# Patient Record
Sex: Male | Born: 1955 | Race: White | Hispanic: No | Marital: Married | Smoking: Former smoker
Health system: Southern US, Community
[De-identification: ages and names within clinical notes are randomized; demographics above are authoritative.]

## PROBLEM LIST (undated history)

## (undated) DIAGNOSIS — D689 Coagulation defect, unspecified: Secondary | ICD-10-CM

## (undated) DIAGNOSIS — I2510 Atherosclerotic heart disease of native coronary artery without angina pectoris: Secondary | ICD-10-CM

## (undated) DIAGNOSIS — I213 ST elevation (STEMI) myocardial infarction of unspecified site: Secondary | ICD-10-CM

## (undated) DIAGNOSIS — R0600 Dyspnea, unspecified: Secondary | ICD-10-CM

## (undated) DIAGNOSIS — E785 Hyperlipidemia, unspecified: Secondary | ICD-10-CM

## (undated) DIAGNOSIS — I10 Essential (primary) hypertension: Secondary | ICD-10-CM

## (undated) DIAGNOSIS — I255 Ischemic cardiomyopathy: Secondary | ICD-10-CM

## (undated) DIAGNOSIS — M1990 Unspecified osteoarthritis, unspecified site: Secondary | ICD-10-CM

## (undated) DIAGNOSIS — K8020 Calculus of gallbladder without cholecystitis without obstruction: Secondary | ICD-10-CM

## (undated) HISTORY — DX: Hyperlipidemia, unspecified: E78.5

## (undated) HISTORY — PX: CORONARY STENT PLACEMENT: SHX1402

## (undated) HISTORY — PX: COLONOSCOPY: SHX174

## (undated) HISTORY — DX: Coagulation defect, unspecified: D68.9

## (undated) HISTORY — DX: Calculus of gallbladder without cholecystitis without obstruction: K80.20

## (undated) HISTORY — DX: ST elevation (STEMI) myocardial infarction of unspecified site: I21.3

## (undated) HISTORY — DX: Essential (primary) hypertension: I10

## (undated) HISTORY — DX: Ischemic cardiomyopathy: I25.5

## (undated) HISTORY — DX: Unspecified osteoarthritis, unspecified site: M19.90

## (undated) HISTORY — PX: MENISCUS REPAIR: SHX5179

---

## 2006-11-23 DIAGNOSIS — D126 Benign neoplasm of colon, unspecified: Secondary | ICD-10-CM

## 2006-11-23 HISTORY — DX: Benign neoplasm of colon, unspecified: D12.6

## 2007-08-17 ENCOUNTER — Ambulatory Visit: Payer: Self-pay | Admitting: Gastroenterology

## 2007-08-31 ENCOUNTER — Ambulatory Visit: Payer: Self-pay | Admitting: Gastroenterology

## 2007-08-31 ENCOUNTER — Encounter: Payer: Self-pay | Admitting: Gastroenterology

## 2008-09-18 ENCOUNTER — Ambulatory Visit (HOSPITAL_BASED_OUTPATIENT_CLINIC_OR_DEPARTMENT_OTHER): Admission: RE | Admit: 2008-09-18 | Discharge: 2008-09-18 | Payer: Self-pay | Admitting: Orthopedic Surgery

## 2011-04-07 NOTE — Op Note (Signed)
[name], Joseph Riddle                   ACCOUNT NO.:  [id number]   MEDICAL RECORD NO.:  [id number]          PATIENT TYPE:  AMB   LOCATION:  [facility]                         FACILITY:  [facility]   PHYSICIAN:  Georges Lynch. Gioffre, M.D.DATE OF BIRTH:  1956-08-01   DATE OF PROCEDURE:  09/18/2008  DATE OF DISCHARGE:                               OPERATIVE REPORT   SURGEON:  Georges Lynch. Darrelyn Hillock, M.D.   ASSISTANT:  Nurse.   PREOPERATIVE DIAGNOSES:  1. Complex tear, lateral meniscus, right knee.  2. Large chondral defect in the lateral femoral condyle.   POSTOPERATIVE DIAGNOSES:  1. Complex tear, lateral meniscus, right knee.  2. Large chondral defect in the lateral femoral condyle.   OPERATION:  1. Diagnostic arthroscopy of the left knee.  2. Lateral meniscectomy of a complex bucket-handle tear of the lateral      meniscus, right knee.   PROCEDURE:  Under general anesthesia, the patient first had 2 grams of  IV Ancef.  At this time a small punctate incision was made in the  suprapatellar region.  An inflow cannula was inserted.  The knee was  distended with saline.  Another small punctate incision was made in the  lateral joint.  The arthroscope was entered on the lateral side.  Note,  I did a complete diagnostic arthroscopy.  He had some mild synovitis in  the suprapatellar pouch.  I went over medially the condyle and the  tibial plateau was normal.  I probed the medial meniscus.  It was  intact.  He had mild synovitis.  The cruciates were intact and over the  lateral joint he had a large complex tear of the medial meniscus, bucket-  handle type, extending from the midportion up anteriorly.  I introduced  the Arthrocare as well as a shaver suction device, did a lateral  meniscectomy of the midportion all the way up anteriorly.  Note, there  was a very interesting highlight, finding he had a large chondral defect  in the lateral femoral condyle with some hemorrhage around it.  There  were no  obvious large loose bodies present.  I thoroughly searched the  knee out.  I thoroughly irrigated out the area and probed the menisci  and the lateral meniscus as well completely to make sure there was no  other loose piece of meniscus. The posterior horn from the midportion  back was intact.  I thoroughly irrigated out the knee, removed the  fluid, and closed all 3 punctate incisions with 3-0 nylon suture.  I  injected 30 mL of 0.5% Marcaine with epinephrine into the knee joint.  Sterile Neosporin, Bunnell dressing was applied.   FOLLOW-UP CARE:  1. He will be on aspirin 325 mg b.i.d. as an anticoagulant.  2. Percocet 10/650 1 every 4 hours p.r.n. for pain.  3. He will be on crutches, partial to full weightbearing as tolerated.  4. I will see him in the office in 12-14 days or prior to if there is      a problem.  ______________________________  Georges Lynch Darrelyn Hillock, M.D.     RAG/MEDQ  D:  09/18/2008  T:  09/18/2008  Job:  [id number]

## 2011-08-25 LAB — CBC
HCT: 47.8
Hemoglobin: 16.2
MCHC: 33.9
MCV: 93.2
Platelets: 390
RBC: 5.13
RDW: 12.6
WBC: 7.6

## 2011-08-25 LAB — COMPREHENSIVE METABOLIC PANEL
Albumin: 4.5
Alkaline Phosphatase: 44
BUN: 13
Calcium: 9.6
Glucose, Bld: 105 — ABNORMAL HIGH
Potassium: 4.1
Sodium: 142
Total Protein: 7.2

## 2011-10-28 ENCOUNTER — Ambulatory Visit (INDEPENDENT_AMBULATORY_CARE_PROVIDER_SITE_OTHER): Payer: BC Managed Care – PPO | Admitting: Family Medicine

## 2011-10-28 DIAGNOSIS — M79609 Pain in unspecified limb: Secondary | ICD-10-CM

## 2011-10-28 DIAGNOSIS — G4700 Insomnia, unspecified: Secondary | ICD-10-CM

## 2012-01-29 ENCOUNTER — Other Ambulatory Visit: Payer: Self-pay | Admitting: *Deleted

## 2012-02-04 ENCOUNTER — Telehealth: Payer: Self-pay

## 2012-02-04 NOTE — Telephone Encounter (Signed)
Pt says that he has made appt for pe in march would like to know if he can get refills since he has that scheduled

## 2012-02-05 NOTE — Telephone Encounter (Signed)
Advised pt that we will call in 3 more refills on Ambien per Alycia Rossetti but need to schedule physical before running out.  Pt agreed.

## 2012-02-10 ENCOUNTER — Ambulatory Visit: Payer: BC Managed Care – PPO | Admitting: Family Medicine

## 2012-03-10 ENCOUNTER — Ambulatory Visit (INDEPENDENT_AMBULATORY_CARE_PROVIDER_SITE_OTHER): Payer: BC Managed Care – PPO | Admitting: Family Medicine

## 2012-03-10 ENCOUNTER — Encounter: Payer: Self-pay | Admitting: Family Medicine

## 2012-03-10 VITALS — BP 138/88 | HR 65 | Temp 97.0°F | Resp 16 | Ht 68.0 in | Wt 164.2 lb

## 2012-03-10 DIAGNOSIS — Z0000 Encounter for general adult medical examination without abnormal findings: Secondary | ICD-10-CM

## 2012-03-10 DIAGNOSIS — M159 Polyosteoarthritis, unspecified: Secondary | ICD-10-CM

## 2012-03-10 DIAGNOSIS — G4700 Insomnia, unspecified: Secondary | ICD-10-CM

## 2012-03-10 LAB — POCT URINALYSIS DIPSTICK
Blood, UA: NEGATIVE
Glucose, UA: NEGATIVE
Spec Grav, UA: 1.025
Urobilinogen, UA: 0.2

## 2012-03-10 MED ORDER — NABUMETONE 500 MG PO TABS: 500.0000 mg | ORAL_TABLET | Freq: Two times a day (BID) | ORAL | Status: DC

## 2012-03-10 MED ORDER — ZOLPIDEM TARTRATE 10 MG PO TABS: 10.0000 mg | ORAL_TABLET | Freq: Every evening | ORAL | Status: DC | PRN

## 2012-03-10 NOTE — Patient Instructions (Addendum)
Tumeric is a natural supplement that helps relieve joint pain; this is the substance that gives mustard its yellow color. You are also getting some information about Anti-Inflammatory Diet.   Keeping you healthy  Get these tests  Blood pressure- Have your blood pressure checked once a year by your healthcare provider.  Normal blood pressure is 120/80  Weight- Have your body mass index (BMI) calculated to screen for obesity.  BMI is a measure of body fat based on height and weight. You can also calculate your own BMI at [email or web address].  Cholesterol- Have your cholesterol checked every year.  Diabetes- Have your blood sugar checked regularly if you have high blood pressure, high cholesterol, have a family history of diabetes or if you are overweight.  Screening for Colon Cancer- Colonoscopy starting at age 81.  Screening may begin sooner depending on your family history and other health conditions. Follow up colonoscopy as directed by your Gastroenterologist.  Screening for Prostate Cancer- Both blood work (PSA) and a rectal exam help screen for Prostate Cancer.  Screening begins at age 12 with African-American men and at age 54 with Caucasian men.  Screening may begin sooner depending on your family history.  Take these medicines  Aspirin- One aspirin daily can help prevent Heart disease and Stroke.  Flu shot- Every fall.  Tetanus- Every 10 years.  Zostavax- Once after the age of 18 to prevent Shingles.  Pneumonia shot- Once after the age of 20; if you are younger than 47, ask your healthcare provider if you need a Pneumonia shot.  Take these steps  Don't smoke- If you do smoke, talk to your doctor about quitting.  For tips on how to quit, go to www.smokefree.gov or call 1-800-QUIT-NOW.  Be physically active- Exercise 5 days a week for at least 30 minutes.  If you are not already physically active start slow and gradually work up to 30 minutes of moderate physical  activity.  Examples of moderate activity include walking briskly, mowing the yard, dancing, swimming, bicycling, etc.  Eat a healthy diet- Eat a variety of healthy food such as fruits, vegetables, low fat milk, low fat cheese, yogurt, lean meant, poultry, fish, beans, tofu, etc. For more information go to www.thenutritionsource.org  Drink alcohol in moderation- Limit alcohol intake to less than two drinks a day. Never drink and drive.  Dentist- Brush and floss twice daily; visit your dentist twice a year.  Depression- Your emotional health is as important as your physical health. If you're feeling down, or losing interest in things you would normally enjoy please talk to your healthcare provider.  Eye exam- Visit your eye doctor every year.  Safe sex- If you may be exposed to a sexually transmitted infection, use a condom.  Seat belts- Seat belts can save your life; always wear one.  Smoke/Carbon Monoxide detectors- These detectors need to be installed on the appropriate level of your home.  Replace batteries at least once a year.  Skin cancer- When out in the sun, cover up and use sunscreen 15 SPF or higher.  Violence- If anyone is threatening you, please tell your healthcare provider. Living Will/ Health care power of attorney- Speak with your healthcare provider and family.

## 2012-03-11 LAB — LIPID PANEL
Cholesterol: 153 mg/dL (ref 0–200)
HDL: 48 mg/dL (ref >39)
LDL Cholesterol: 92 mg/dL (ref 0–99)
Total CHOL/HDL Ratio: 3.2 Ratio
Triglycerides: 66 mg/dL (ref <150)
VLDL: 13 mg/dL (ref 0–40)

## 2012-03-11 LAB — PSA: PSA: 0.38 ng/mL (ref <=4.00)

## 2012-03-11 NOTE — Progress Notes (Signed)
Quick Note:  Please notify pt that results are normal.   Provide pt with copy of labs. ______ 

## 2012-03-13 ENCOUNTER — Encounter: Payer: Self-pay | Admitting: Family Medicine

## 2012-03-13 NOTE — Progress Notes (Addendum)
  Subjective:    Patient ID: Joseph Riddle, male    DOB: [birth year], 56 y.o.   MRN: [id number]  HPI   This 56 y.o Cauc male is here for  CPE; chronic medical problems include DJD and Insomina  secondary to chronic nocturnal pain. He has had Orthopedic surgeries on his knee and shoulder with  persistent pain  involving back, shoulders (bone spur on left side), wrists, knees and feet. Otherwise, he  has no major complaints.          He is married and he and his wife have adopted to children who are half-siblings; the younger one   is 42 years old. He works as a IT trainer. His exercise tolerance is limited due to chronic pain.    Review of Systems  As per HPI; otherwise, noncontributory     Objective:   Physical Exam  Nursing note and vitals reviewed. Constitutional: He is oriented to person, place, and time. He appears well-developed and well-nourished. No distress.  HENT:  Head: Normocephalic and atraumatic.  Right Ear: External ear normal.  Left Ear: External ear normal.  Nose: Nose normal.  Mouth/Throat: Oropharynx is clear and moist.  Eyes: Conjunctivae and EOM are normal. Pupils are equal, round, and reactive to light. No scleral icterus.  Neck: No thyromegaly present.       ROM is decreased in all directions.  Cardiovascular: Normal rate, regular rhythm, normal heart sounds and intact distal pulses.  Exam reveals no gallop.   No murmur heard. Pulmonary/Chest: Effort normal and breath sounds normal. No respiratory distress. He exhibits no tenderness.  Abdominal: Bowel sounds are normal. He exhibits no distension and no mass. There is no guarding. Hernia confirmed negative in the right inguinal area and confirmed negative in the left inguinal area.  Genitourinary: Rectum normal, prostate normal, testes normal and penis normal. Rectal exam shows no external hemorrhoid, no mass and anal tone normal. Guaiac negative stool. Right testis shows no mass, no swelling and no tenderness. Left testis  shows no mass, no swelling and no tenderness.  Musculoskeletal:       Right shoulder with significant audible crepitus and decreased ROM with external rotation. Both knees and ankles with mild deformities and decreased ROM; well-healed scar noted on right knee  Lymphadenopathy:    He has no cervical adenopathy.       Right: No inguinal adenopathy present.       Left: No inguinal adenopathy present.  Neurological: He is alert and oriented to person, place, and time. He has normal reflexes. No cranial nerve deficit. Coordination normal.  Skin: Skin is warm and dry. No rash noted. No pallor.  Psychiatric: His speech is normal and behavior is normal. Judgment and thought content normal. Cognition and memory are normal.       Mood is normal but affect is flat          Assessment & Plan:   1. Routine general medical examination at a health care facility  Lipid panel, PSA    2. Degenerative joint disease involving multiple joints  -Encouraged increased activity level to improve joint mobility -Tumeric (dietary supplement) has anti-inflammatory properties -Given Anti-Inflammatory diet -Continue  Fish oil supplement -Discontinue Indocin -Trial Nabumetone 500 mg  1 tab bid with meals;   contact office if no improve in 2 months  3. Insomnia  zolpidem (AMBIEN) 10 MG tablet    Pt was given  RX: Zostavax (immunization at outside pharmacy at his convenience)

## 2012-03-30 ENCOUNTER — Encounter: Payer: Self-pay | Admitting: Family Medicine

## 2012-04-29 ENCOUNTER — Ambulatory Visit (INDEPENDENT_AMBULATORY_CARE_PROVIDER_SITE_OTHER): Payer: BC Managed Care – PPO | Admitting: Family Medicine

## 2012-04-29 ENCOUNTER — Ambulatory Visit: Payer: BC Managed Care – PPO

## 2012-04-29 ENCOUNTER — Encounter: Payer: Self-pay | Admitting: Family Medicine

## 2012-04-29 VITALS — BP 137/87 | HR 67 | Temp 97.0°F | Resp 16 | Ht 68.5 in | Wt 167.0 lb

## 2012-04-29 DIAGNOSIS — G4701 Insomnia due to medical condition: Secondary | ICD-10-CM

## 2012-04-29 DIAGNOSIS — M542 Cervicalgia: Secondary | ICD-10-CM

## 2012-04-29 DIAGNOSIS — M25512 Pain in left shoulder: Secondary | ICD-10-CM

## 2012-04-29 DIAGNOSIS — G8929 Other chronic pain: Secondary | ICD-10-CM

## 2012-04-29 DIAGNOSIS — R079 Chest pain, unspecified: Secondary | ICD-10-CM

## 2012-04-29 DIAGNOSIS — M25519 Pain in unspecified shoulder: Secondary | ICD-10-CM

## 2012-04-29 MED ORDER — INDOMETHACIN 25 MG PO CAPS: 25.0000 mg | ORAL_CAPSULE | Freq: Two times a day (BID) | ORAL | Status: DC

## 2012-04-29 MED ORDER — GABAPENTIN 100 MG PO CAPS: ORAL_CAPSULE | ORAL | Status: DC

## 2012-04-29 NOTE — Patient Instructions (Signed)

## 2012-04-29 NOTE — Progress Notes (Addendum)
  Subjective:    Patient ID: Joseph Riddle, male    DOB: Mar 22, 1956, 56 y.o.   MRN: [id number]  HPI  Thsi 56 y.o. Cauc male is here for evaluation of shoulder/ joint pains, left-sided neck and chest  discomfort, fatigue and poor sleep hygiene. Indomethacin helps more than any other NSAID. Foot  and leg discomfort interrupts sleep.; Zolpidem is essential for any rest. Feels stressed. The left-sided  chest pain radiates up into neck and also down left arm, accompanied by weakness and numbness.  The pain is not associated with activity and can occur at rest, lasting 30 minutes. Also having heartburn  because of poor dietary habits; takes Zantac few times/ week.      Review of Systems  Constitutional: Positive for fatigue. Negative for fever and diaphoresis.  HENT: Positive for neck pain and neck stiffness.   Respiratory: Positive for shortness of breath. Negative for cough and chest tightness.   Cardiovascular: Positive for palpitations.  Gastrointestinal: Negative for nausea and abdominal pain.  Musculoskeletal: Positive for arthralgias. Negative for myalgias and joint swelling.  Neurological: Positive for numbness. Negative for syncope, speech difficulty and headaches.  Psychiatric/Behavioral: Positive for disturbed wake/sleep cycle. The patient is nervous/anxious.        Objective:   Physical Exam  Nursing note and vitals reviewed. Constitutional: He is oriented to person, place, and time. He appears well-developed and well-nourished. No distress.  HENT:  Head: Normocephalic and atraumatic.  Right Ear: External ear normal.  Left Ear: External ear normal.  Mouth/Throat: Oropharynx is clear and moist.  Eyes: Conjunctivae and EOM are normal. No scleral icterus.  Neck: No thyromegaly present.       ROM decreased with rotation, extension, flexion is normal  Cardiovascular: Normal rate, regular rhythm and normal heart sounds.  Exam reveals no gallop and no friction rub.   No murmur  heard. Pulmonary/Chest: Effort normal. No respiratory distress. He has no wheezes.  Musculoskeletal:       C-spine and T-spine are tender over spinous processes; Paravertebral muscle spasms  Neurological: He is alert and oriented to person, place, and time. No cranial nerve deficit. Coordination normal.  Skin: Skin is warm and dry.  Psychiatric:       Flat affect with pressured speech and distracted demeanor; fidgets and is anxious through visit     [facility] reading (PRIMARY) by  Dr. Audria Nine:   Multi-level degenerative changes at C5-6, C6-7 and C7-T1;                                                                              Worse at C6-7 with spurring and loss of disc space  ECG: NSR without abnormalities.       Assessment & Plan:   1. Shoulder pain, bilateral - if pain continues, consider MRI Continue Indomethacin Trial Gabapentin  100 mg initially at bedtime; increase to 1 bid until follow-up  2. Chest pain at rest  EKG 12-Lead- normal  3. Neck pain on left side  DG Cervical Spine 2-3 Views  4. Insomnia secondary to chronic pain

## 2012-05-17 ENCOUNTER — Other Ambulatory Visit: Payer: Self-pay | Admitting: Family Medicine

## 2012-07-13 ENCOUNTER — Ambulatory Visit (INDEPENDENT_AMBULATORY_CARE_PROVIDER_SITE_OTHER): Payer: BC Managed Care – PPO | Admitting: Family Medicine

## 2012-07-13 ENCOUNTER — Encounter: Payer: Self-pay | Admitting: Family Medicine

## 2012-07-13 VITALS — BP 110/82 | HR 62 | Temp 98.0°F | Resp 16 | Ht 69.0 in | Wt 165.4 lb

## 2012-07-13 DIAGNOSIS — M2550 Pain in unspecified joint: Secondary | ICD-10-CM

## 2012-07-13 DIAGNOSIS — M5030 Other cervical disc degeneration, unspecified cervical region: Secondary | ICD-10-CM | POA: Insufficient documentation

## 2012-07-13 MED ORDER — GABAPENTIN 300 MG PO CAPS: ORAL_CAPSULE | ORAL | Status: DC

## 2012-07-13 NOTE — Patient Instructions (Addendum)
Try Arnica gel for joint pain. Call Dr. Jeannetta Ellis office to set up an appointment. Gabapentin dose has been increased to:  300 mg per capsule   Take 1 capsule at midday and 2 capsules at bedtime.    (Take the 100 mg capsules as follows- 2 at midday and 3 at bedtime until they are gone).

## 2012-07-13 NOTE — Progress Notes (Signed)
S; This 56 y.o. Cauc male returns for follow-up re: chronic joint pain;  His shoulder pain is no better and he notes stiffness and "creaking" in joints. His hands hurt and his feet ache at end of the day. He was evaluated by a podiatrist (? Dr. Ralene Cork at [facility] was advised to consider left foot surgery for bone spurs. He is wearing SASS shoes which help; Gabapentin prescribed by me at last visit is ineffective (dose is 100 mg twice a day). He has trouble sleeping due to leg pain.   Pain= 8/10 by end of the day.   ROS: No fever or activity change. + arthralgias and gait problems. Insomnia- Ambien helps.  O:  Filed Vitals:   07/13/12 0937  BP: 110/82  Pulse: 62  Temp: 98 F (36.7 C)  Resp: 16   GEN: In NAD; WN,WD COR: RRR LUNGS: Normal resp rate and effort MS: C-spine with forward positioning of head; tender paravertebral muscles and decreased ROM with rotation and extension         T-spine- mild kyphosis noted in upper portion         Shoulders- decreased ROM with limited internal rotation. Crepitus with rotation         Knees- tender right joint without effusion, redness or warmth; no significant deformity NEURO: A&O x 3; CNs intact without focality. Motor function normal in upper ext.   A/P: 1. Multiple joint pain - needs to have ORTHO Evaluation re: C-spine DDD and shoulder pain Ambulatory referral to Orthopedic Surgery RX: Gabapentin 300 mg 1 capsule at midday and 2 capsules at bedtime (pt has 100 mg capsules and can take 2 caps at midday and 3 caps hs until finished)  2. DDD (degenerative disc disease), cervical  May need MRI of C-spine

## 2012-08-26 ENCOUNTER — Encounter: Payer: Self-pay | Admitting: Gastroenterology

## 2012-09-17 ENCOUNTER — Other Ambulatory Visit: Payer: Self-pay

## 2012-09-17 DIAGNOSIS — G4700 Insomnia, unspecified: Secondary | ICD-10-CM

## 2012-09-17 MED ORDER — ZOLPIDEM TARTRATE 10 MG PO TABS: 10.0000 mg | ORAL_TABLET | Freq: Every evening | ORAL | Status: DC | PRN

## 2012-11-08 ENCOUNTER — Other Ambulatory Visit: Payer: Self-pay | Admitting: Family Medicine

## 2012-12-01 ENCOUNTER — Other Ambulatory Visit: Payer: Self-pay | Admitting: Family Medicine

## 2012-12-13 ENCOUNTER — Ambulatory Visit (INDEPENDENT_AMBULATORY_CARE_PROVIDER_SITE_OTHER): Payer: BC Managed Care – PPO | Admitting: Family Medicine

## 2012-12-13 ENCOUNTER — Encounter: Payer: Self-pay | Admitting: Family Medicine

## 2012-12-13 VITALS — BP 120/80 | HR 74 | Temp 97.4°F | Resp 16 | Ht 69.0 in | Wt 168.0 lb

## 2012-12-13 DIAGNOSIS — M25569 Pain in unspecified knee: Secondary | ICD-10-CM

## 2012-12-13 DIAGNOSIS — Z760 Encounter for issue of repeat prescription: Secondary | ICD-10-CM

## 2012-12-13 DIAGNOSIS — G4700 Insomnia, unspecified: Secondary | ICD-10-CM

## 2012-12-13 DIAGNOSIS — J0100 Acute maxillary sinusitis, unspecified: Secondary | ICD-10-CM

## 2012-12-13 DIAGNOSIS — M25561 Pain in right knee: Secondary | ICD-10-CM

## 2012-12-13 MED ORDER — GABAPENTIN 300 MG PO CAPS: ORAL_CAPSULE | ORAL | Status: DC

## 2012-12-13 MED ORDER — ZOLPIDEM TARTRATE 10 MG PO TABS: 10.0000 mg | ORAL_TABLET | Freq: Every evening | ORAL | Status: DC | PRN

## 2012-12-13 MED ORDER — CEFUROXIME AXETIL 250 MG PO TABS: 250.0000 mg | ORAL_TABLET | Freq: Two times a day (BID) | ORAL | Status: DC

## 2012-12-13 NOTE — Patient Instructions (Addendum)

## 2012-12-13 NOTE — Progress Notes (Signed)
S: This 56 y.o. Cauc male has chronic insomnia, partially related to chronic moderately severe right knee pain. He had a bucket-handle meniscal tear which was surgically repaired by Dr. Darrelyn Hillock in 2008. He has had persistent pain with crepitus, worse at end of day. 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 Gabapentin and Indomethacin seem to help provide some relief; Gabapentin does help with sleep. Pt would like to increase dose of this med. He takes a midday Gabapentin dose and has no sedation. He still needs to take Zolpidem for sleep.  His main concern today is sinus pain and possible infection. Symptoms onset 3-4 days ago; he denies fever /chills. He has a mild headache and left ear discomfort (which is chronic). Household members are ill, especially 3 y.o.grandson. He states this is the busiest time of year for him and he cannot afford to be sick. Sudafed has not been very beneficial.  ROS: As per HPI.  O:  Filed Vitals:   12/13/12 1625  BP: 120/80  Pulse: 74  Temp: 97.4 F (36.3 C)  Resp: 16   GEN: In NAD; WN,WD. HEENT: [place]/AT; EOMI w/ injected conj and clear sclerae. EACs normal. TMs dull w/ chronic scarring. Maxillary sinuses tender (R>L). Post ph erythematous w/o exudate. NECK: No LAN or TMG. COR: RRR. LUNGS: Normal resp rate and effort. SKIN: Mild facial erythema. No rashes. MS: Right knee- crepitus palpable at medial tibial plateau. Joint visibly larger compared to Left knee. NEURO: A&O x 3; CNs intact. Nonfocal.  A/P:  1. Sinusitis, acute maxillary  RX: Cefuroxime 250 mg 1 tab bid x 10 days- pt wants to drop off RX at pharmacy.  2. Pain in joint of right knee  Increase Gabapentin bedtime dose to 3 capsules Pt to schedule appt w/ ORTHO  3. Insomnia  zolpidem (AMBIEN) 10 MG tablet  4. Issue of repeat prescriptions

## 2013-03-10 ENCOUNTER — Telehealth: Payer: Self-pay

## 2013-03-10 DIAGNOSIS — G4700 Insomnia, unspecified: Secondary | ICD-10-CM

## 2013-03-10 NOTE — Telephone Encounter (Signed)
Pharm requests RF of zolpidem 10 mg. 

## 2013-03-13 ENCOUNTER — Encounter: Payer: Self-pay | Admitting: Family Medicine

## 2013-03-13 MED ORDER — ZOLPIDEM TARTRATE 10 MG PO TABS: 10.0000 mg | ORAL_TABLET | Freq: Every evening | ORAL | Status: DC | PRN

## 2013-03-13 NOTE — Telephone Encounter (Signed)
Zolpidem refill called to pharmacy- #[phone] w/ 2 RFs.

## 2013-03-16 ENCOUNTER — Other Ambulatory Visit: Payer: Self-pay | Admitting: Physician Assistant

## 2013-04-11 ENCOUNTER — Other Ambulatory Visit: Payer: Self-pay | Admitting: Family Medicine

## 2013-05-12 ENCOUNTER — Encounter: Payer: Self-pay | Admitting: Gastroenterology

## 2013-06-14 ENCOUNTER — Other Ambulatory Visit: Payer: Self-pay | Admitting: Physician Assistant

## 2013-06-14 ENCOUNTER — Other Ambulatory Visit: Payer: Self-pay | Admitting: Family Medicine

## 2013-06-14 NOTE — Telephone Encounter (Signed)
Pt said pharmacy was sending over requests for his refills.  He doesn't know if we can fill them or if he needs to come in to have them filled.  He doesn't mind coming in if he needs to, he just needs to know asap because he is about to go out of town.  Please call [phone]

## 2013-06-14 NOTE — Telephone Encounter (Signed)
Pharmacy reqs RF of zolpidem 10 mg

## 2013-06-15 ENCOUNTER — Telehealth: Payer: Self-pay

## 2013-06-15 NOTE — Telephone Encounter (Signed)
Please advise on Ambien refill request.

## 2013-06-15 NOTE — Telephone Encounter (Signed)
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  He is a regular patient and is going out of town and needs his prescription before he leaves.  His best number is [phone] and his pharmacy is [facility] on [address].

## 2013-06-16 ENCOUNTER — Telehealth: Payer: Self-pay | Admitting: Radiology

## 2013-06-16 DIAGNOSIS — G4700 Insomnia, unspecified: Secondary | ICD-10-CM

## 2013-06-16 MED ORDER — ZOLPIDEM TARTRATE 10 MG PO TABS: 10.0000 mg | ORAL_TABLET | Freq: Every evening | ORAL | Status: DC | PRN

## 2013-06-16 NOTE — Telephone Encounter (Signed)
Patient called again about the Ambien. Please advise so I can call patient. His cell #[phone] or [phone], he indicates he has been trying to get filled since Tuesday, he called again yesterday. Please advise if one of you can renew this for him,or if I have to wait for the response from Dr Audria Nine.

## 2013-06-16 NOTE — Telephone Encounter (Signed)
Rx printed and ready to be sent to pharmacy 

## 2013-06-16 NOTE — Telephone Encounter (Signed)
Pt advised Rx sent to [facility]

## 2013-06-21 NOTE — Telephone Encounter (Signed)
This medication refill was commpleted by Rhoderick Moody, PA on 06/16/13.

## 2013-07-14 ENCOUNTER — Other Ambulatory Visit: Payer: Self-pay | Admitting: Physician Assistant

## 2013-07-17 ENCOUNTER — Telehealth: Payer: Self-pay

## 2013-07-17 DIAGNOSIS — G4700 Insomnia, unspecified: Secondary | ICD-10-CM

## 2013-07-17 NOTE — Telephone Encounter (Signed)
Pt is calling and stating that he is suppose to have had his ambien filled and the pharmacy is suppose to have faxed over a request Call back number is [phone] Pharmacy is [facility] on [address]

## 2013-07-17 NOTE — Telephone Encounter (Signed)
Pharmacy needs to send requests electronically.  I do not see where we have received a request from the pharmacy, but I will forward pt's request to Dr. Audria Nine

## 2013-07-18 MED ORDER — ZOLPIDEM TARTRATE 10 MG PO TABS: 10.0000 mg | ORAL_TABLET | Freq: Every evening | ORAL | Status: DC | PRN

## 2013-07-18 NOTE — Telephone Encounter (Signed)
Patient advised of this. 

## 2013-07-18 NOTE — Telephone Encounter (Signed)
I phoned medication refill request to pt's pharmacy.

## 2013-07-18 NOTE — Telephone Encounter (Signed)
[facility] is still in [place] and faxes the request for controlls, please advise, Amy

## 2013-07-19 ENCOUNTER — Telehealth: Payer: Self-pay

## 2013-07-19 ENCOUNTER — Ambulatory Visit (INDEPENDENT_AMBULATORY_CARE_PROVIDER_SITE_OTHER): Payer: BC Managed Care – PPO | Admitting: Family Medicine

## 2013-07-19 ENCOUNTER — Encounter: Payer: Self-pay | Admitting: Family Medicine

## 2013-07-19 VITALS — BP 120/82 | HR 67 | Temp 97.9°F | Resp 16 | Ht 67.0 in | Wt 159.8 lb

## 2013-07-19 DIAGNOSIS — M159 Polyosteoarthritis, unspecified: Secondary | ICD-10-CM

## 2013-07-19 DIAGNOSIS — G4700 Insomnia, unspecified: Secondary | ICD-10-CM

## 2013-07-19 DIAGNOSIS — J329 Chronic sinusitis, unspecified: Secondary | ICD-10-CM

## 2013-07-19 MED ORDER — ZOLPIDEM TARTRATE 10 MG PO TABS: 10.0000 mg | ORAL_TABLET | Freq: Every evening | ORAL | Status: DC | PRN

## 2013-07-19 MED ORDER — GABAPENTIN 300 MG PO CAPS: ORAL_CAPSULE | ORAL | Status: DC

## 2013-07-19 MED ORDER — DICLOFENAC SODIUM 75 MG PO TBEC: 75.0000 mg | DELAYED_RELEASE_TABLET | Freq: Two times a day (BID) | ORAL | Status: DC

## 2013-07-19 MED ORDER — CEFUROXIME AXETIL 250 MG PO TABS: 250.0000 mg | ORAL_TABLET | Freq: Two times a day (BID) | ORAL | Status: DC

## 2013-07-19 NOTE — Telephone Encounter (Signed)
Pt notified by phone ready for pickup.   Pt came by around 330 and picked up forms.  bf

## 2013-07-19 NOTE — Telephone Encounter (Signed)
I will authorize a Temporary Placard; completed form is at [address] [facility] and he can pick it up tomorrow.

## 2013-07-19 NOTE — Telephone Encounter (Signed)
Pt called requesting a handicap placard due to his knee problems. Pt saw dr Audria Nine today and states he forgot to ask her about that during his appt.   States he is seeing an ortho dr soon, but doesn't know if it will end up in surgery or not. States not sure if the tag he needs is temporary or permanent.   Please call pt  bf

## 2013-07-19 NOTE — Progress Notes (Signed)
S: This 57 y.o Cauc male has chronic joint pain and foot pain; he plans to return to the podiatrist this fall to discuss foot surgery ([facility]). He also has neck pain ("I have spurs on the bones in my neck") and this seems to be worsening.  He has chronic L shoulder pain that is getting worse. He feels tightness and pain in L upper chest and biceps area when he raises arm above shoulder level. He has chronic knee pain (R>L) and has seen Dr. Darrelyn Hillock in the past about possible knee surgery. Pt will contact his office but already states he does not want to have surgery. He does not recall having MRI of shoulder or knee. Pt thinks Dr. Darrelyn Hillock tried Celebrex but pt saw no benefit after 2-3 weeks of this medication. He is willing to try it again as Indomethacin is not as effective as it used to be.  Pt c/o pain in L sinus w/ congestion and cannot breath through L nostril. His L eye hurts and his has light drainage from L nostril with PND. Mucinex and Sudafed help. Left side always flares up; he has been afebrile and w/o cough, epistaxis, n/v. Pt remarks, "I may have a tumor or some other serious problem; I don't know".  Insomnia- needs Zolpidem to rest well. Leg and foot pain prevents restful sleep. Laying on left side- this position is not comfortable as L side of head and neck seem to hurt worse.   Patient Active Problem List   Diagnosis Date Noted  . DDD (degenerative disc disease), cervical 07/13/2012  . Degenerative joint disease involving multiple joints 03/10/2012  . Insomnia 03/10/2012   PMHx, Soc Hx and Fam Hx reviewed. Medications reconciled.  ROS; As per HPI; otherwise noncontributory.  O: Filed Vitals:   07/19/13 0953  BP: 120/82  Pulse: 67  Temp: 97.9 F (36.6 C)  Resp: 16   GEN: In NAD; WN,WD. Is not relaxed but seems to have mild pain. HEENT: [place]/AT; EOMI w/ clear conj/sclerae. EACs/TMs intact w/ chronic scarring- no effusion, erythema or bulging. Nasal mucosa  erythematous. Sinuses- NT w/ percussion. Post ph- erythema w/ mild cobblestoning, no exudate. NECK: Tender over post muscles and spinous processes. COR: RRR. LUNGS: Normal resp rate and effort. MS: L shoulder- decreased ROM w/ pain reproducible above 90 degrees; biceps/deltoid nontender.  R knee- Valgus deviation of lower leg; no joint effusion. BACK: Nontender levator and rhomboid muscles. NEURO: A&O x 3; CNs intact. Sensory/ motor function intact. PSYCH: Anxious demeanor w/o agitation, lability or inappropriateness. Speech pattern is normal but thought content a little tangential and scattered. Judgement is sound.  A/P: Degenerative joint disease involving multiple joints - Pt will sch follow-up w/ Dr. Darrelyn Hillock. He may need MRI of L shoulder/ upper arm to fully evaluate this chronic problem. Trial Diclofenac 75 mg 1 tablet by mouth twice a day w/ meals. Discontinue Indomethacin. (Pt was willing to re-try Celebrex but cost may have been prohibitive) Plan: Uric Acid, Sedimentation Rate, Hepatitis C antibody, Rheumatoid factor  Insomnia - Plan: zolpidem (AMBIEN) 10 MG tablet  Sinusitis, chronic - Plan: Cefuroxime 250 mg 1 tab twice a day for 10 days; CT Maxillofacial WO CM- sinuses need to be evaluated.  Meds ordered this encounter  Medications  . DISCONTD: cefUROXime (CEFTIN) 250 MG tablet    Sig: Take 1 tablet (250 mg total) by mouth 2 (two) times daily.    Dispense:  20 tablet    Refill:  0  .  cefUROXime (CEFTIN) 250 MG tablet    Sig: Take 1 tablet (250 mg total) by mouth 2 (two) times daily.    Dispense:  20 tablet    Refill:  0  . zolpidem (AMBIEN) 10 MG tablet    Sig: Take 1 tablet (10 mg total) by mouth at bedtime as needed.    Dispense:  30 tablet    Refill:  3    Do not fill before September 16, 2013.            . diclofenac (VOLTAREN) 75 MG EC tablet    Sig: Take 1 tablet (75 mg total) by mouth 2 (two) times daily. Take after meals.    Dispense:  60 tablet    Refill:  3   . gabapentin (NEURONTIN) 300 MG capsule    Sig: TAKE 1 CAPSULE AT MIDDAY AND 3 CAPSULES AT BEDTIME    Dispense:  120 capsule    Refill:  5

## 2013-07-19 NOTE — Patient Instructions (Addendum)
I have ordered a CT of your sinuses to look for signs of chronic sinusitis or other abnormality to explain recurrent left-sided headache and "sinus infection".  Please contact Dr. Jeannetta Ellis office to schedule a follow-up appointment for re-assessment of shoulder and knee pain. I am prescribing a different anti-inflammatory pain medication (one that is covered by your insurer).  I will contact you with the results of imaging and labs when I have all results.

## 2013-07-20 LAB — COMPREHENSIVE METABOLIC PANEL
AST: 20 U/L (ref 0–37)
Albumin: 4.4 g/dL (ref 3.5–5.2)
Alkaline Phosphatase: 52 U/L (ref 39–117)
Potassium: 4.2 mEq/L (ref 3.5–5.3)
Sodium: 142 mEq/L (ref 135–145)
Total Bilirubin: 0.6 mg/dL (ref 0.3–1.2)
Total Protein: 6.4 g/dL (ref 6.0–8.3)

## 2013-07-20 LAB — HEPATITIS C ANTIBODY: HCV Ab: NEGATIVE

## 2013-07-20 NOTE — Progress Notes (Signed)
Quick Note:  Please notify pt that results are normal.   Provide pt with copy of labs. ______ 

## 2013-07-25 ENCOUNTER — Other Ambulatory Visit: Payer: Self-pay

## 2013-07-25 ENCOUNTER — Ambulatory Visit
Admission: RE | Admit: 2013-07-25 | Discharge: 2013-07-25 | Disposition: A | Discharge status: Home / Self Care | Payer: BC Managed Care – PPO | Source: Ambulatory Visit | Attending: Family Medicine | Admitting: Family Medicine

## 2013-07-25 DIAGNOSIS — J329 Chronic sinusitis, unspecified: Secondary | ICD-10-CM

## 2013-07-25 NOTE — Progress Notes (Signed)
Quick Note:  Advise pt that recent sinus CT showed only minor changes of chronic sinusitis. No further evaluation needed at this time. ______

## 2013-07-31 ENCOUNTER — Telehealth: Payer: Self-pay | Admitting: Family Medicine

## 2013-07-31 NOTE — Telephone Encounter (Signed)
Advised pt that sinus CT showed very minor changes in sinuses c/w chronic sinusitis. Offered referral to ENT but pt declined; "medical budget of this year spent on CT". I reviewed suggestions to help reduce symptoms; he has NETI POT which I encouraged him to use.

## 2013-09-04 ENCOUNTER — Telehealth: Payer: Self-pay | Admitting: *Deleted

## 2013-09-04 NOTE — Telephone Encounter (Signed)
Pt called left only name, DOB and [phone].  Returned call 400pm - left message to call with concern or set up an appt.

## 2013-09-29 ENCOUNTER — Ambulatory Visit (INDEPENDENT_AMBULATORY_CARE_PROVIDER_SITE_OTHER): Payer: BC Managed Care – PPO

## 2013-09-29 VITALS — BP 124/87 | HR 82 | Resp 18

## 2013-09-29 DIAGNOSIS — M7730 Calcaneal spur, unspecified foot: Secondary | ICD-10-CM

## 2013-09-29 DIAGNOSIS — M2020 Hallux rigidus, unspecified foot: Secondary | ICD-10-CM

## 2013-09-29 DIAGNOSIS — M7660 Achilles tendinitis, unspecified leg: Secondary | ICD-10-CM

## 2013-09-29 DIAGNOSIS — M79609 Pain in unspecified limb: Secondary | ICD-10-CM

## 2013-09-29 DIAGNOSIS — G5790 Unspecified mononeuropathy of unspecified lower limb: Secondary | ICD-10-CM

## 2013-09-29 DIAGNOSIS — G5791 Unspecified mononeuropathy of right lower limb: Secondary | ICD-10-CM

## 2013-09-29 DIAGNOSIS — M1990 Unspecified osteoarthritis, unspecified site: Secondary | ICD-10-CM

## 2013-09-29 NOTE — Progress Notes (Signed)
Subjective:    Patient ID: Joseph Riddle, male    DOB: 02/14/1956, 57 y.o.   MRN: [id number]  HPIbunion left foot and been going on for 2 years and pain with or without shoes and redness and sore and tender and the right heel been bothering me for about 3 years and burning and hurts on side and bottom Patient is referred bunion left foot is actually described it is a hallux limitus and rigidus deformity both great joints great toe joints have reduced range of motion left significantly worse than right. There is pain on any attempted motion on the left foot great toe joint. Patient also in pain in the posterior lateral aspect of the right heel not in the inferior heel. It hurts when he is driving his car or lying in bed he describes a paresthesia burning or shooting pain that occurs he is actually taking gabapentin for this problem.   Review of Systems  Constitutional: Negative.   HENT: Positive for sinus pressure.        Ringing in ears  Eyes: Positive for pain.  Respiratory: Negative.   Cardiovascular: Negative.   Gastrointestinal: Negative.   Endocrine: Negative.   Genitourinary: Negative.   Musculoskeletal:       Joint pain and difficulty walking  Skin: Negative.   Allergic/Immunologic: Negative.   Hematological: Negative.   Psychiatric/Behavioral: Negative.        Objective:   Physical Exam  Vitals reviewed. Constitutional: He is oriented to person, place, and time. He appears well-nourished.  Cardiovascular:  Pulses:      Dorsalis pedis pulses are 2+ on the right side, and 2+ on the left side.       Posterior tibial pulses are 1+ on the right side, and 1+ on the left side.  Capillary refill time 3 seconds all digits. Skin temperature warm turgor normal no edema rubor pallor or varicosities noted.  Musculoskeletal:  Orthopedic biomechanical exam is remarkable for prominence of the first MTP area dorsally bilateral there is some dorsal spurring palpable both great toe joint with  limited range of motion left more so than right . X-rays demonstrate asymmetric joint space narrowing of the great toe joints particular left with dorsal spurring and exostoses consistent with hallux rigidus and limitus deformity left more so than right. There is inferior calcaneal and retrocalcaneal spurring of the heels bilateral no signs of fracture or osseous abnormality mild osteopenic changes are noted as well.  Neurological: He is alert and oriented to person, place, and time. He has normal strength and normal reflexes.  Epicritic and proprioceptive sensations intact and symmetric bilateral there is normal plantar response and DTRs. Patient also has some hyperesthesia secondary to his arthritis in his knee is having pain and paresthesia to posterior heel and lateral heel area right  Skin: Skin is warm and dry. No cyanosis. Nails show no clubbing.  Skin color pigment normal hair growth absent bilateral nails unremarkable there may be symmetric the fat pad on both plantar forefoot and heel areas bilateral.  Psychiatric: He has a normal mood and affect. His behavior is normal.       Assessment & Plan:  #1 is Achilles tendinitis with possible bursitis and neuralgia or neuritis posterior lateral right heel. Plan at this time is steroid injection with therapeutic nerve block for the lateral sural nerve and calcaneal nerve right heel. Also suggested alternating hot and cold compresses to the heel and a good stable cushioned shoe. Patient may have to  retrocalcaneal spurring and arthrosis contributing to the symptomology. I do not feel this is a fascial problem nor do I think it's tendinitis her Achilles problem solely.  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 There is are throat cysts and deformity of the great toe joints with dorsal and medial lateral spurring noted on x-ray as well as  limitation of motion. Literature arthritis dispensed to the patient and he is likely a candidate at some point for surgical intervention either cheilectomy our more likely needing Lorenz Coaster with implant arthroplasty/joint replacement type procedure. Dispensed literature on arthritic joint and discussed this briefly will followup further at next visit at this time also dispensed recommendations for warm compress ice pack staying in a stiff shoe the possibility orthotics may be beneficial although not likely to unduly the damage is a recurrent. Patient will recheck in 2-3 weeks for possible surgical consultation and further discussion of both problems as far as the heel and arthritic great toe joint.  Alvan Dame DPM

## 2013-09-29 NOTE — Patient Instructions (Signed)

## 2013-10-17 ENCOUNTER — Ambulatory Visit (INDEPENDENT_AMBULATORY_CARE_PROVIDER_SITE_OTHER): Payer: BC Managed Care – PPO

## 2013-10-17 VITALS — BP 130/82 | HR 70 | Resp 12

## 2013-10-17 DIAGNOSIS — G5791 Unspecified mononeuropathy of right lower limb: Secondary | ICD-10-CM

## 2013-10-17 DIAGNOSIS — M79609 Pain in unspecified limb: Secondary | ICD-10-CM

## 2013-10-17 DIAGNOSIS — M2020 Hallux rigidus, unspecified foot: Secondary | ICD-10-CM

## 2013-10-17 DIAGNOSIS — M1990 Unspecified osteoarthritis, unspecified site: Secondary | ICD-10-CM

## 2013-10-17 DIAGNOSIS — G5790 Unspecified mononeuropathy of unspecified lower limb: Secondary | ICD-10-CM

## 2013-10-17 NOTE — Patient Instructions (Signed)
Pre-Operative Instructions  Congratulations, you have decided to take an important step to improving your quality of life.  You can be assured that the doctors of [facility] will be with you every step of the way.  1. Plan to be at the surgery center/hospital at least 1 (one) hour prior to your scheduled time unless otherwise directed by the surgical center/hospital staff.  You must have a responsible adult accompany you, remain during the surgery and drive you home.  Make sure you have directions to the surgical center/hospital and know how to get there on time. 2. For hospital based surgery you will need to obtain a history and physical form from your family physician within 1 month prior to the date of surgery- we will give you a form for you primary physician.  3. We make every effort to accommodate the date you request for surgery.  There are however, times where surgery dates or times have to be moved.  We will contact you as soon as possible if a change in schedule is required.   4. No Aspirin/Ibuprofen for one week before surgery.  If you are on aspirin, any non-steroidal anti-inflammatory medications (Mobic, Aleve, Ibuprofen) you should stop taking it 7 days prior to your surgery.  You make take Tylenol  For pain prior to surgery.  5. Medications- If you are taking daily heart and blood pressure medications, seizure, reflux, allergy, asthma, anxiety, pain or diabetes medications, make sure the surgery center/hospital is aware before the day of surgery so they may notify you which medications to take or avoid the day of surgery. 6. No food or drink after midnight the night before surgery unless directed otherwise by surgical center/hospital staff. 7. No alcoholic beverages 24 hours prior to surgery.  No smoking 24 hours prior to or 24 hours after surgery. 8. Wear loose pants or shorts- loose enough to fit over bandages, boots, and casts. 9. No slip on shoes, sneakers are best. 10. Bring  your boot with you to the surgery center/hospital.  Also bring crutches or a walker if your physician has prescribed it for you.  If you do not have this equipment, it will be provided for you after surgery. 11. If you have not been contracted by the surgery center/hospital by the day before your surgery, call to confirm the date and time of your surgery. 12. Leave-time from work may vary depending on the type of surgery you have.  Appropriate arrangements should be made prior to surgery with your employer. 13. Prescriptions will be provided immediately following surgery by your doctor.  Have these filled as soon as possible after surgery and take the medication as directed. 14. Remove nail polish on the operative foot. 15. Wash the night before surgery.  The night before surgery wash the foot and leg well with the antibacterial soap provided and water paying special attention to beneath the toenails and in between the toes.  Rinse thoroughly with water and dry well with a towel.  Perform this wash unless told not to do so by your physician.  Enclosed: 1 Ice pack (please put in freezer the night before surgery)   1 Hibiclens skin cleaner   Pre-op Instructions  If you have any questions regarding the instructions, do not hesitate to call our office.  [place]: [address] [place], [place] [address] [phone]  [place]: [address] [place], [place] [address] [phone]  [place]: [address]  [place],  [address] [phone]  Dr. Gissella Niblack   Tuchman DPM, Dr. Norman Regal DPM Dr. Kennidee Heyne DPM, Dr. M. Todd Hyatt DPM, Dr. Kathryn Egerton DPM 

## 2013-10-17 NOTE — Progress Notes (Signed)
Subjective:    Patient ID: Joseph Riddle, male    DOB: 06-24-1956, 57 y.o.   MRN: [id number]  HPI Comments: ''RT FOOT HEEL STILL PAINFUL AND THE LT FOOT BUNION STILL THE SAME''  Foot Pain Associated symptoms include joint swelling.   patient continues to have pain and posterior neuritis posterior lateral aspect of his right heel is most significant only when he is driving or when he is in bed. I cannot reproduce a symptomology on palpation at this time. There is no reproducible inferior calcaneal or retrocalcaneal pain. This appears to more neurologic in nature. At this time patient also continues to have pain and limitation of motion of his great toe joints left being more significant. Patient wished to have something done quickly with a quick recovery lying to return back to work activities.  Review of Systems  Constitutional: Negative.   HENT: Positive for sinus pressure.   Eyes: Negative.   Respiratory: Negative.   Cardiovascular: Negative.   Gastrointestinal: Negative.   Endocrine: Negative.   Genitourinary: Negative.   Musculoskeletal: Positive for joint swelling.  Allergic/Immunologic: Negative.   Neurological: Negative.   Hematological: Negative.   Psychiatric/Behavioral: Negative.   All other systems reviewed and are negative.       Objective:   Physical Exam  Constitutional: He is oriented to person, place, and time. He appears well-developed and well-nourished.  Cardiovascular:  Pulses:      Dorsalis pedis pulses are 2+ on the right side, and 2+ on the left side.       Posterior tibial pulses are 1+ on the right side, and 1+ on the left side.  Capillary refill timed 3-4 seconds all digits. Skin temperature warm turgor normal no edema rubor pallor or varicosities noted.  Musculoskeletal:  Patient has hallux limitus/rigidus deformity with prominence of the first MTP area left foot x-rays confirm dorsal spurring of the first metatarsal and phalanx and asymmetric joint space  narrowing and loss of joint space first MTP joint left foot.  Neurological: He is alert and oriented to person, place, and time. He has normal strength and normal reflexes.  Epicritic and proprioceptive sensations intact and symmetric bilateral or plantar response and DTRs noted.  Skin: Skin is warm and dry. No cyanosis. Nails show no clubbing.  Glossy skin color pigment normal hair growth absent nails unremarkable  Psychiatric: He has a normal mood and affect. His behavior is normal.          Assessment & Plan:  Assessment this time patient does have neuritis or neuralgia posterior lateral right heel will be addressed at a future date suggested possible referral to neurology or nerve conduction for studies when he's ready to followup and pursue treatment for that problem otherwise maintain his current gabapentin regimen.  For the hallux rigidus deformity recommendation at this time is surgical intervention consent form for her proposed first MTP implant arthroplasty/Keller with implant procedure is reviewed and signed plan for an MDI implant by salona to be utilized at the first MTP joint. Risks K. Salters are discussed with the patient understands he'll be in a surgical shoe for about 3-4 week duration after which she can return to a walking cast or athletic shoe. He will be ambulatory from day one although limited in walking 5 minutes per hour first we can spread second week. And so on. 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 The consent forms reviewed and signed all questions asked medication are answered at this time in surgery we scheduled at his earliest convenience.  Alvan Dame DPM

## 2013-10-23 DIAGNOSIS — M19079 Primary osteoarthritis, unspecified ankle and foot: Secondary | ICD-10-CM

## 2013-10-23 DIAGNOSIS — M2020 Hallux rigidus, unspecified foot: Secondary | ICD-10-CM

## 2013-10-23 HISTORY — PX: FOOT SURGERY: SHX648

## 2013-10-31 ENCOUNTER — Ambulatory Visit (INDEPENDENT_AMBULATORY_CARE_PROVIDER_SITE_OTHER): Payer: BC Managed Care – PPO

## 2013-10-31 VITALS — BP 129/85 | HR 54 | Resp 16

## 2013-10-31 DIAGNOSIS — Z9889 Other specified postprocedural states: Secondary | ICD-10-CM

## 2013-10-31 DIAGNOSIS — M2020 Hallux rigidus, unspecified foot: Secondary | ICD-10-CM

## 2013-10-31 NOTE — Progress Notes (Signed)
   Subjective:    Patient ID: Joseph Riddle, male    DOB: [birth year], 57 y.o.   MRN: [id number]  HPI Comments: "Doing okay"  DOS 10-23-2013 POV #1 left foot        Review of Systems deferred     Objective:   Physical Exam Neurovascular status is intact dressings intact and dry patient ambulating Darco shoe. X-rays taken at this time revealed good position of the first MTP implant likely and range of motion was dressings removed incisions clean dry well coapted range of motion proxy 30 or more degrees dorsiflexion approximately 20 plantar flexion of the hallux MTP joint is possible the left great toe patient has mild edema mild ecchymosis no significant pain. At this time dressed a compressive dressing was reapplied maintained until this Sunday for the next 5 days at which time he remove may resume normal normal bathing and hygiene. Also dispensed Ace anklet to maintain compression. Maintain Darco shoe for 3-4 more weeks.       Assessment & Plan:  Assessment good postop progress dry sterile dressing reapplied at this time maintained for 56 more days starting centimeters in bathing and hygiene and maintain anklet for compression and Darco shoe for angulation reappointed 4 weeks for further postop followup and followup x-rays did stress the importance of active and passive range of motion exercises physically moving the toe up and down 100 times a day is demonstrated and explained to the patient written instructions also given. Patient is upset, had to wait for extended period of time as well as more than an hour behind. Patient appeared agitated about having to followup and waitt again. I recommended he needs to her followup visits at one month and 3 month followup to monitor the healing and rehabilitation process.  Alvan Dame DPM

## 2013-10-31 NOTE — Patient Instructions (Signed)
ICE INSTRUCTIONS  Apply ice or cold pack to the affected area at least 3 times a day for 10-15 minutes each time.  You should also use ice after prolonged activity or vigorous exercise.  Do not apply ice longer than 20 minutes at one time.  Always keep a cloth between your skin and the ice pack to prevent burns.  Being consistent and following these instructions will help control your symptoms.  We suggest you purchase a gel ice pack because they are reusable and do bit leak.  Some of them are designed to wrap around the area.  Use the method that works best for you.  Here are some other suggestions for icing.   Use a frozen bag of peas or corn-inexpensive and molds well to your body, usually stays frozen for 10 to 20 minutes.  Wet a towel with cold water and squeeze out the excess until it's damp.  Place in a bag in the freezer for 20 minutes. Then remove and use.  Starting Sunday 12/14 may remove the dressings and begin washing and normal bathing and hygiene. Will maintain the Ace anklet Sir compression stockings daily for the next one to 2 months.  Daily range of motion exercises manually move the hallux her great toe up and down 100 times a day  Followup in one month for followup x-rays and evaluation

## 2013-11-07 ENCOUNTER — Other Ambulatory Visit: Payer: Self-pay | Admitting: Family Medicine

## 2013-11-21 ENCOUNTER — Ambulatory Visit (INDEPENDENT_AMBULATORY_CARE_PROVIDER_SITE_OTHER): Payer: BC Managed Care – PPO

## 2013-11-21 VITALS — BP 117/76 | HR 72 | Resp 16 | Ht 70.0 in | Wt 165.0 lb

## 2013-11-21 DIAGNOSIS — M1990 Unspecified osteoarthritis, unspecified site: Secondary | ICD-10-CM

## 2013-11-21 DIAGNOSIS — Z9889 Other specified postprocedural states: Secondary | ICD-10-CM

## 2013-11-21 DIAGNOSIS — M2020 Hallux rigidus, unspecified foot: Secondary | ICD-10-CM

## 2013-11-21 NOTE — Patient Instructions (Signed)
ICE INSTRUCTIONS  Apply ice or cold pack to the affected area at least 3 times a day for 10-15 minutes each time.  You should also use ice after prolonged activity or vigorous exercise.  Do not apply ice longer than 20 minutes at one time.  Always keep a cloth between your skin and the ice pack to prevent burns.  Being consistent and following these instructions will help control your symptoms.  We suggest you purchase a gel ice pack because they are reusable and do bit leak.  Some of them are designed to wrap around the area.  Use the method that works best for you.  Here are some other suggestions for icing.   Use a frozen bag of peas or corn-inexpensive and molds well to your body, usually stays frozen for 10 to 20 minutes.  Wet a towel with cold water and squeeze out the excess until it's damp.  Place in a bag in the freezer for 20 minutes. Then remove and use.   Also very important to continue with active and passive range of motion of the great toe joint daily continued exercises big toe up and down and aggressive fashion to help improve and maintain movement at the great toe joint.

## 2013-11-21 NOTE — Progress Notes (Signed)
   Subjective:    Patient ID: Joseph Riddle, male    DOB: 28-Jul-1956, 57 y.o.   MRN: [id number] "This sock has been irritating it more than anything.  It rubs on that stitch keeps it raw." HPI    Review of Systems no changes     Objective:   Physical Exam Neurovascular status is intact with pedal pulses palpable incision is well coapted suture tags are removed at this time. The anklet has been irritating the incision advised to discontinue anklet as there is little or no edema noted. Patient is relatively good range of motion approximately 3035 dorsiflexion 510 plantar flexion for about 40-45 range patient is advised to continue aggressively with active passive range of motion exercises daily. Will reappointed 2 months for long-term followup. Patient is doing well status post Joseph Riddle with implant arthroplasty left great toe joint.       Assessment & Plan:  Assessment good postop progress steadily improving range of motion activities. May discontinue surgical shoe and anklet and return to good walking shoes oxfords or athletic shoes continue with range of motion exercises and indicated. Reappointed 2 months for long-term postop followup maintain some Neosporin are cocoa butter to the incision area a daily basis.  Alvan Dame DPM

## 2013-11-27 NOTE — Progress Notes (Signed)
1)Keller bunionectomy with implant great toe joint left foot

## 2013-12-06 ENCOUNTER — Encounter: Payer: Self-pay | Admitting: Family Medicine

## 2013-12-06 ENCOUNTER — Ambulatory Visit (INDEPENDENT_AMBULATORY_CARE_PROVIDER_SITE_OTHER): Payer: 59 | Admitting: Family Medicine

## 2013-12-06 VITALS — BP 118/74 | HR 70 | Temp 97.8°F | Resp 16 | Ht 68.5 in | Wt 158.0 lb

## 2013-12-06 DIAGNOSIS — M159 Polyosteoarthritis, unspecified: Secondary | ICD-10-CM

## 2013-12-06 DIAGNOSIS — G4700 Insomnia, unspecified: Secondary | ICD-10-CM

## 2013-12-06 MED ORDER — DICLOFENAC SODIUM 75 MG PO TBEC: DELAYED_RELEASE_TABLET | ORAL | Status: DC

## 2013-12-06 MED ORDER — GABAPENTIN 300 MG PO CAPS: ORAL_CAPSULE | ORAL | Status: DC

## 2013-12-06 MED ORDER — ZOLPIDEM TARTRATE 10 MG PO TABS: 10.0000 mg | ORAL_TABLET | Freq: Every evening | ORAL | Status: DC | PRN

## 2013-12-06 NOTE — Progress Notes (Signed)
S:  This 58 y.o. Cauc male has chronic arthralgias contributing to chronic insomnia. Recent L foot surgery on 1st metatarsal by Dr. [name] in Dec 2014. Residual "soreness". Chronic knee pain, R>>L; Diclofenac is effective but pt plans to sch visit w/ ORTHO to discuss surgical options.  Gabapentin is effective for leg discomfort and foot pain. Joint pain limits his activities w/ his young children. He has no medication side effects.  Patient Active Problem List   Diagnosis Date Noted  . DDD (degenerative disc disease), cervical 07/13/2012  . Degenerative joint disease involving multiple joints 03/10/2012  . Insomnia 03/10/2012   ROS: As per HPI; otherwise noncontributory.  O: Filed Vitals:   12/06/13 0814  BP: 118/74  Pulse: 70  Temp: 97.8 F (36.6 C)  Resp: 16   GEN: In NAD; WN,WD. HENT: [place]/AT; EOMI w/clear conj/sclerae. Otherwise unremarkable. COR: RRR. LUNGS: Normal resp rate and effort. MS: R knee- valgus abnormality. Pt moves stiffly off exam table. NEURO: A&Ox 3; CNs intact. Nonfocal.  A/P: Insomnia - Plan: zolpidem (AMBIEN) 10 MG tablet  Degenerative joint disease involving multiple joints- Schedule follow-up w/ Dr. Gladstone Lighter for treatment option for R knee.

## 2013-12-06 NOTE — Patient Instructions (Signed)
For knee pain - there is a product called TOMMIE COPPER that is a copper-infused sleeve that you can purchase online or via telephone.  Also try the over-the-counter OSTEO [place]; this wil not restore your joint to normal but it may give you some relief.

## 2014-05-09 ENCOUNTER — Encounter: Payer: Self-pay | Admitting: Family Medicine

## 2014-05-09 ENCOUNTER — Ambulatory Visit (INDEPENDENT_AMBULATORY_CARE_PROVIDER_SITE_OTHER): Payer: 59 | Admitting: Family Medicine

## 2014-05-09 VITALS — BP 110/72 | HR 70 | Temp 97.6°F | Resp 16 | Ht 68.5 in | Wt 155.8 lb

## 2014-05-09 DIAGNOSIS — R079 Chest pain, unspecified: Secondary | ICD-10-CM

## 2014-05-09 DIAGNOSIS — Z0000 Encounter for general adult medical examination without abnormal findings: Secondary | ICD-10-CM

## 2014-05-09 DIAGNOSIS — G4700 Insomnia, unspecified: Secondary | ICD-10-CM

## 2014-05-09 DIAGNOSIS — M159 Polyosteoarthritis, unspecified: Secondary | ICD-10-CM

## 2014-05-09 LAB — CBC WITH DIFFERENTIAL/PLATELET
BASOS PCT: 0 % (ref 0–1)
Basophils Absolute: 0 10*3/uL (ref 0.0–0.1)
EOS ABS: 0.4 10*3/uL (ref 0.0–0.7)
EOS PCT: 7 % — AB (ref 0–5)
HCT: 49.2 % (ref 39.0–52.0)
HEMOGLOBIN: 17 g/dL (ref 13.0–17.0)
LYMPHS ABS: 2 10*3/uL (ref 0.7–4.0)
Lymphocytes Relative: 32 % (ref 12–46)
MCH: 31.2 pg (ref 26.0–34.0)
MCHC: 34.6 g/dL (ref 30.0–36.0)
MCV: 90.3 fL (ref 78.0–100.0)
MONOS PCT: 14 % — AB (ref 3–12)
Monocytes Absolute: 0.9 10*3/uL (ref 0.1–1.0)
NEUTROS PCT: 47 % (ref 43–77)
Neutro Abs: 3 10*3/uL (ref 1.7–7.7)
Platelets: 296 10*3/uL (ref 150–400)
RBC: 5.45 MIL/uL (ref 4.22–5.81)
RDW: 12.6 % (ref 11.5–15.5)
WBC: 6.4 10*3/uL (ref 4.0–10.5)

## 2014-05-09 LAB — BASIC METABOLIC PANEL
BUN: 21 mg/dL (ref 6–23)
CHLORIDE: 103 meq/L (ref 96–112)
CO2: 30 meq/L (ref 19–32)
CREATININE: 1 mg/dL (ref 0.50–1.35)
Calcium: 9.5 mg/dL (ref 8.4–10.5)
Glucose, Bld: 54 mg/dL — ABNORMAL LOW (ref 70–99)
Potassium: 4.5 mEq/L (ref 3.5–5.3)
Sodium: 143 mEq/L (ref 135–145)

## 2014-05-09 MED ORDER — ZOSTER VACCINE LIVE 19400 UNT/0.65ML ~~LOC~~ SOLR: 0.6500 mL | Freq: Once | SUBCUTANEOUS | Status: DC

## 2014-05-09 MED ORDER — ZOLPIDEM TARTRATE 10 MG PO TABS: 10.0000 mg | ORAL_TABLET | Freq: Every evening | ORAL | Status: DC | PRN

## 2014-05-09 NOTE — Progress Notes (Signed)
Subjective:    Patient ID: Joseph Riddle, male    DOB: May 11, 1956, 58 y.o.   MRN: [id number]  HPI   This 58 y.o. 46 male is here for CPE and labs. His most significant chronic medical issue is joint pain involving R knee, feet, shoulders and spine. He c/o left anterior CP radiating to neck and shoulder; this pain occurs when he is anxious and tired. Duration: > 30 minutes. No associated SOB, diaphoresis, n/v or nocturnal pain. Pt had normal ECG in June 2013.  Pt has significant impingement syndrome in L shoulder as well as C-spine DDD.  Patient Active Problem List   Diagnosis Date Noted  . DDD (degenerative disc disease), cervical 07/13/2012  . Degenerative joint disease involving multiple joints 03/10/2012  . Insomnia 03/10/2012   Prior to Admission medications   Medication Sig Start Date End Date Taking? Authorizing Provider  cycloSPORINE (RESTASIS) 0.05 % ophthalmic emulsion Place 1 drop into both eyes 2 (two) times daily.   Yes Historical Provider, MD  diclofenac (VOLTAREN) 75 MG EC tablet TAKE ONE TABLET BY MOUTH TWICE DAILY AFTER MEALS 12/06/13  Yes Barton Fanny, MD  fish oil-omega-3 fatty acids 1000 MG capsule Take 2 g by mouth daily.   Yes Historical Provider, MD  gabapentin (NEURONTIN) 300 MG capsule TAKE 1 CAPSULE AT MIDDAY AND 3 CAPSULES AT BEDTIME 12/06/13  Yes Barton Fanny, MD  Multiple Vitamin (MULTIVITAMIN) tablet Take 1 tablet by mouth daily.   Yes Historical Provider, MD  zolpidem (AMBIEN) 10 MG tablet Take 1 tablet (10 mg total) by mouth at bedtime as needed.   Yes Barton Fanny, MD   PMHx, Surg Hx, Soc and Fam Hx reviewed.   Review of Systems  Constitutional: Negative.   HENT: Negative.   Eyes: Positive for photophobia, pain and itching.       Has chronic dry eye.  Respiratory: Negative.   Cardiovascular: Positive for chest pain.  Gastrointestinal: Negative.   Endocrine: Negative.   Genitourinary: Negative.   Musculoskeletal: Positive for  arthralgias.       Chronic R knee pain, stiff toes and R knee pain; pt is s/p L foot surgery and resists referral for knee replacement.  Skin: Negative.   Allergic/Immunologic: Negative.   Neurological: Negative.   Hematological: Negative.   Psychiatric/Behavioral: Positive for sleep disturbance.       Chronic insomnia due to R heel pain.       Objective:   Physical Exam  Nursing note and vitals reviewed. Constitutional: He is oriented to person, place, and time. Vital signs are normal. He appears well-developed and well-nourished.  HENT:  Head: Normocephalic and atraumatic.  Right Ear: Hearing, tympanic membrane, external ear and ear canal normal.  Left Ear: Hearing, tympanic membrane, external ear and ear canal normal.  Nose: Nose normal. No nasal deformity or septal deviation. Right sinus exhibits no maxillary sinus tenderness and no frontal sinus tenderness. Left sinus exhibits no maxillary sinus tenderness and no frontal sinus tenderness.  Mouth/Throat: Uvula is midline, oropharynx is clear and moist and mucous membranes are normal. No oral lesions. Normal dentition.  L>R TMJ- Prominent when opening and closing mouth.  Eyes: Conjunctivae, EOM and lids are normal. Pupils are equal, round, and reactive to light. No scleral icterus.  Fundoscopic exam:      The right eye shows no papilledema. The right eye shows red reflex.       The left eye shows no papilledema. The left eye shows  red reflex.  Neck: Trachea normal, normal range of motion and full passive range of motion without pain. Neck supple. No JVD present. No spinous process tenderness and no muscular tenderness present. Carotid bruit is not present. No mass and no thyromegaly present.  Kyphosis.  Cardiovascular: Regular rhythm, S1 normal, S2 normal, normal heart sounds and normal pulses.   No extrasystoles are present. Bradycardia present.  PMI is not displaced.  Exam reveals no gallop and no friction rub.   No murmur  heard. Pulmonary/Chest: Effort normal and breath sounds normal. No respiratory distress.  Abdominal: Soft. Normal appearance and bowel sounds are normal. He exhibits no distension, no abdominal bruit, no pulsatile midline mass and no mass. There is no hepatosplenomegaly. There is no tenderness. There is no guarding and no CVA tenderness. No hernia.  Genitourinary:  Deferred.  Musculoskeletal:       Right shoulder: Normal.       Left shoulder: He exhibits decreased range of motion, tenderness and decreased strength. He exhibits no swelling, no crepitus, no deformity and no pain.       Right knee: He exhibits deformity, abnormal alignment and bony tenderness. Tenderness found.       Right ankle: He exhibits no swelling and no deformity. Tenderness.       Cervical back: He exhibits no tenderness, no deformity, no pain and no spasm.       Thoracic back: He exhibits deformity. He exhibits no tenderness, no pain and no spasm.       Lumbar back: He exhibits decreased range of motion and spasm. He exhibits no tenderness, no deformity and no pain.  Kyphosis at upper T-spine. R heel tender. R knee with extreme valgus deviation >> abnormal gait. L shoulder w/ abduction limited to 90 degrees.  Lymphadenopathy:       Head (right side): No submental, no submandibular, no tonsillar, no preauricular, no posterior auricular and no occipital adenopathy present.       Head (left side): No submental, no submandibular, no tonsillar, no preauricular, no posterior auricular and no occipital adenopathy present.    He has no cervical adenopathy.    He has no axillary adenopathy.       Right: No inguinal and no supraclavicular adenopathy present.       Left: No inguinal and no supraclavicular adenopathy present.  Neurological: He is alert and oriented to person, place, and time. He has normal strength. He displays no atrophy and no tremor. No cranial nerve deficit or sensory deficit. He exhibits normal muscle tone. He  displays a negative Romberg sign. Coordination normal.  Reflex Scores:      Tricep reflexes are 2+ on the right side and 2+ on the left side.      Bicep reflexes are 2+ on the right side and 2+ on the left side.      Brachioradialis reflexes are 2+ on the right side and 2+ on the left side.      Patellar reflexes are 2+ on the right side and 2+ on the left side. Skin: Skin is warm, dry and intact. No ecchymosis, no lesion and no rash noted. No cyanosis or erythema. No pallor. Nails show no clubbing.  Psychiatric: His speech is normal and behavior is normal. Judgment and thought content normal. His mood appears not anxious. His affect is not labile and not inappropriate. Cognition and memory are normal. He exhibits a depressed mood.      Assessment & Plan:  Routine general medical  examination at a health care facility  Chest pain - R/O CAD. Plan: Ambulatory referral to Cardiology, Basic metabolic panel, CBC with Differential  Degenerative joint disease involving multiple joints - Continue current medications. Encouraged pt to consider consultation w/ Dr. Lara Mulch for knee replacement. Continue OTC Ultra-Joint supplement; get Calcium + Magnesium + Zinc supplement. Plan: Vitamin D, 25-hydroxy  Insomnia - Plan: zolpidem (AMBIEN) 10 MG tablet  Meds ordered this encounter  . zolpidem (AMBIEN) 10 MG tablet    Sig: Take 1 tablet (10 mg total) by mouth at bedtime as needed.    Dispense:  30 tablet    Refill:  5

## 2014-05-09 NOTE — Patient Instructions (Signed)
Keeping you healthy  Get these tests  Blood pressure- Have your blood pressure checked once a year by your healthcare provider.  Normal blood pressure is 120/80  Weight- Have your body mass index (BMI) calculated to screen for obesity.  BMI is a measure of body fat based on height and weight. You can also calculate your own BMI at ViewBanking.si.  Cholesterol- Have your cholesterol checked every year.  Diabetes- Have your blood sugar checked regularly if you have high blood pressure, high cholesterol, have a family history of diabetes or if you are overweight.  Screening for Colon Cancer- Colonoscopy starting at age 57.  Screening may begin sooner depending on your family history and other health conditions. Follow up colonoscopy as directed by your Gastroenterologist.  Screening for Prostate Cancer- Both blood work (PSA) and a rectal exam help screen for Prostate Cancer.  Screening begins at age 46 with African-American men and at age 12 with Caucasian men.  Screening may begin sooner depending on your family history.  Take these medicines  Aspirin- One aspirin daily can help prevent Heart disease and Stroke.  Flu shot- Every fall.  Tetanus- Every 10 years. Tdap received in 2010. Next Tetanus due in 2020.  Zostavax- Once after the age of 34 to prevent Shingles. This vaccine is recommended if you had chicken pox as a child. You have received the prescription for this vaccine. Take it to [facility] , [facility] or [facility]; the pharmacist can administer the vaccine to you.  Pneumonia shot- Once after the age of 53; if you are younger than 51, ask your healthcare provider if you need a Pneumonia shot.  Take these steps  Don't smoke- If you do smoke, talk to your doctor about quitting.  For tips on how to quit, go to www.smokefree.gov or call 1-800-QUIT-NOW.  Be physically active- Exercise 5 days a week for at least 30 minutes.  If you are not already physically active start  slow and gradually work up to 30 minutes of moderate physical activity.  Examples of moderate activity include walking briskly, mowing the yard, dancing, swimming, bicycling, etc.  Eat a healthy diet- Eat a variety of healthy food such as fruits, vegetables, low fat milk, low fat cheese, yogurt, lean meant, poultry, fish, beans, tofu, etc. For more information go to www.thenutritionsource.org  Drink alcohol in moderation- Limit alcohol intake to less than two drinks a day. Never drink and drive.  Dentist- Brush and floss twice daily; visit your dentist twice a year.  Depression- Your emotional health is as important as your physical health. If you're feeling down, or losing interest in things you would normally enjoy please talk to your healthcare provider.  Eye exam- Visit your eye doctor every year.  Safe sex- If you may be exposed to a sexually transmitted infection, use a condom.  Seat belts- Seat belts can save your life; always wear one.  Smoke/Carbon Monoxide detectors- These detectors need to be installed on the appropriate level of your home.  Replace batteries at least once a year.  Skin cancer- When out in the sun, cover up and use sunscreen 15 SPF or higher.  Violence- If anyone is threatening you, please tell your healthcare provider.  Living Will/ Health care power of attorney- Speak with your healthcare provider and family.     For chronic joint pain- get an over-the-counter supplement containing Calcium + Magnesium + Zinc. This may help reduce you r joint pain.  If your right knee pain  continues, you may need to seriously consider a knee replacement. If you do not want to return to Dr. Gladstone Lighter, I would recommend Dr. Lara Mulch who has done knee replacements on several of my patients.  You may want to see him just for a consultation; there is new technology and procedures that you may want to consider.

## 2014-05-10 LAB — VITAMIN D 25 HYDROXY (VIT D DEFICIENCY, FRACTURES): VIT D 25 HYDROXY: 46 ng/mL (ref 30–89)

## 2014-05-13 NOTE — Progress Notes (Signed)
Quick Note:  Please notify pt that results are normal.   Provide pt with copy of labs. ______ 

## 2014-05-15 ENCOUNTER — Encounter: Payer: Self-pay | Admitting: Radiology

## 2014-05-15 ENCOUNTER — Telehealth: Payer: Self-pay | Admitting: Family Medicine

## 2014-05-15 NOTE — Telephone Encounter (Signed)
Message copied by Barton Fanny on Tue May 15, 2014  6:46 PM ------      Message from: [place], [facility]      Created: Tue May 15, 2014  2:14 PM       Gave pt results. He says that he is not set up to see his cardiologist until 7/23. Is it ok to wait that long? ------

## 2014-05-15 NOTE — Telephone Encounter (Signed)
I called home phone number and line is busy. I wanted to ask pt if his symptoms had changed or increased in any way. If so, he should go to ED for more detailed evaluation; the cardiologist could be contacted to direct further evaluation if a cardiac cause for chest pain is suspected.

## 2014-05-16 NOTE — Telephone Encounter (Signed)
Spoke with patient and he is doing ok.  He thinks he will be fine until his cardiology visit.  Advised him if symptoms return or get worse, he should go to ER for evaluation.

## 2014-06-14 ENCOUNTER — Ambulatory Visit (INDEPENDENT_AMBULATORY_CARE_PROVIDER_SITE_OTHER): Payer: 59 | Admitting: Internal Medicine

## 2014-06-14 ENCOUNTER — Encounter: Payer: Self-pay | Admitting: Internal Medicine

## 2014-06-14 ENCOUNTER — Ambulatory Visit (HOSPITAL_COMMUNITY)
Admission: RE | Admit: 2014-06-14 | Discharge: 2014-06-14 | Disposition: A | Discharge status: Home / Self Care | Payer: 59 | Source: Ambulatory Visit | Attending: Cardiology | Admitting: Cardiology

## 2014-06-14 VITALS — BP 140/90 | HR 71 | Ht 69.0 in | Wt 157.8 lb

## 2014-06-14 DIAGNOSIS — R079 Chest pain, unspecified: Secondary | ICD-10-CM

## 2014-06-14 DIAGNOSIS — M542 Cervicalgia: Secondary | ICD-10-CM

## 2014-06-14 DIAGNOSIS — R002 Palpitations: Secondary | ICD-10-CM

## 2014-06-14 NOTE — Procedures (Signed)
Exercise Treadmill Test  Test  Exercise Tolerance Test Ordering MD: Pixie Casino    Unique Test No: 1   Treadmill:  1  Indication for ETT: chest pain - rule out ischemia  Contraindication to ETT: No   Stress Modality: exercise - treadmill  Cardiac Imaging Performed: non   Protocol: standard Bruce - maximal  Max BP:  167/122  Max MPHR (bpm):  160 85% MPR (bpm):  136  MPHR obtained (bpm):  141 % MPHR obtained:  88  Reached 85% MPHR (min:sec):  7:20 Total Exercise Time (min-sec):  8  Workload in METS:  9.7 Borg Scale: 14  Reason ETT Terminated:  Diastolic HTN >503    ST Segment Analysis At Rest: NSR with no ST changes. With Exercise: no evidence of significant ST depression  Other Information Arrhythmia:  No Angina during ETT:  Patient complained of burning chest pain with exercise. Quality of ETT:  diagnostic  ETT Interpretation:  No ST changes with exercise.  Comments: ETT with fair exercise tolerance; hypertensive diastolic BP response (peak 122); patient complained of burning chest pain after completing exercise; no ST changes.  Kirk Ruths

## 2014-06-14 NOTE — Patient Instructions (Signed)
Your physician has requested that you have an exercise tolerance test. For further information please visit [email or web address]. Please also follow instruction sheet, as given.  PLEASE SCHEDULE TODAY.   We will call you with the results. If you test is normal, you can follow up as needed.

## 2014-06-19 ENCOUNTER — Encounter: Payer: Self-pay | Admitting: Internal Medicine

## 2014-06-19 DIAGNOSIS — R002 Palpitations: Secondary | ICD-10-CM | POA: Insufficient documentation

## 2014-06-19 DIAGNOSIS — R079 Chest pain, unspecified: Secondary | ICD-10-CM | POA: Insufficient documentation

## 2014-06-19 DIAGNOSIS — M542 Cervicalgia: Secondary | ICD-10-CM | POA: Insufficient documentation

## 2014-06-19 NOTE — Progress Notes (Signed)
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 He has had previous surgeries and had bad outcomes and says that he never wants to have surgery again but does appear to be in significant pain. Particularly in the left shoulder and left neck area. He has pain in the base of his neck that radiates to shoulder and down to the left side of his chest. This is somewhat the pain that he feels however sometimes when he is doing exercise or exerting himself he gets shortness of breath a little more discomfort in his chest. When it is hot outside he reports some palpitations. The symptoms sound somewhat atypical for cardiac chest pain however he is concerned that this could be cardiac chest pain. He reports that he was referred here for a stress test today, however he was referred for an evaluation for stress test.  PMHx:  Past Medical History  Diagnosis Date  . Arthritis     Past Surgical History  Procedure Laterality Date  . Meniscus repair    . Foot surgery Left 10/2013    Dr. [name]    FAMHx:  Family History  Problem Relation Age of Onset  . Heart disease Maternal Grandfather   . Rheumatic fever Mother   . Heart disease Father     SOCHx:   reports that he quit smoking about 13 years ago. His smoking use included Cigarettes. He smoked 0.00 packs per day for 15 years. He has never used smokeless tobacco. He reports that he does not drink alcohol or use illicit drugs.  ALLERGIES:  No Known Allergies  ROS: A comprehensive review of systems was negative except for: Cardiovascular: positive for chest pain Musculoskeletal: positive for myalgias, neck pain and stiff joints  HOME MEDS: Current Outpatient  Prescriptions  Medication Sig Dispense Refill  . cycloSPORINE (RESTASIS) 0.05 % ophthalmic emulsion Place 1 drop into both eyes 2 (two) times daily.      . diclofenac (VOLTAREN) 75 MG EC tablet TAKE ONE TABLET BY MOUTH TWICE DAILY AFTER MEALS  60 tablet  11  . fish oil-omega-3 fatty acids 1000 MG capsule Take 2 g by mouth daily.      Marland Kitchen gabapentin (NEURONTIN) 300 MG capsule TAKE 1 CAPSULE AT MIDDAY AND 3 CAPSULES AT BEDTIME  120 capsule  11  . Multiple Vitamin (MULTIVITAMIN) tablet Take 1 tablet by mouth daily.      Marland Kitchen zolpidem (AMBIEN) 10 MG tablet Take 1 tablet (10 mg total) by mouth at bedtime as needed.  30 tablet  5   No current facility-administered medications for this visit.    LABS/IMAGING: No results found for this or any previous visit (from the past 48 hour(s)). No results found.  VITALS: BP 140/90  Pulse 71  Ht 5\' 9"  (1.753 m)  Wt 157 lb 12.8 oz (71.578 kg)  BMI 23.29 kg/m2  EXAM: General appearance: alert and no distress Neck: no carotid bruit and no JVD Lungs: clear to auscultation bilaterally Heart: regular rate and rhythm, S1, S2 normal, no murmur, click, rub or gallop Abdomen: soft, non-tender; bowel sounds normal; no masses,  no organomegaly Extremities: left shoulder droop, walks with a limp on the right knee Pulses: 2+ and symmetric Skin: Skin color,  texture, turgor normal. No rashes or lesions Neurologic: Grossly normal Psych: Agitated, short-tempered, digusted  EKG: Normal sinus rhythm at 71  ASSESSMENT: 1. Atypical chest pain 2. Significant orthopedic pain and left shoulder pain 3. Possible cervical radiculopathy  PLAN: 1.   Joseph Riddle is reporting left-sided chest pain which is worse when he is doing certain exercises. It somewhat worse when chasing after his kids. He does get short of breath being outside. He does have significant left shoulder pain orthopedic problems which could be causing his pain. I would recommend at maximum a treadmill exercise  stress test to evaluate for any significant coronary ischemia. If this is negative, suspect his symptoms are coming from his shoulder and/or his neck. He reports that he is uninterested in getting any future surgeries. We did discuss it is important to rule out any cardiac problems before he were to go to surgery if he should change his mind. Finally, he says that he was specifically told that he was going to have a stress test today. I told him that he was scheduled for a consultation and that would be at my discretion to decide what test to do. He was very upset about this and demanded to have a stress test in the office. I was able to arrange an exercise stress test on the day of the exam for his at convenience.  I will be contact with you regarding the results of that study.  Thank you again for referring him.  Pixie Casino, MD, [facility] Attending Cardiologist [facility]  Laddie Naeem C 06/19/2014, 8:11 PM

## 2014-11-08 ENCOUNTER — Ambulatory Visit (INDEPENDENT_AMBULATORY_CARE_PROVIDER_SITE_OTHER): Payer: 59 | Admitting: Family Medicine

## 2014-11-08 ENCOUNTER — Ambulatory Visit (INDEPENDENT_AMBULATORY_CARE_PROVIDER_SITE_OTHER): Payer: 59

## 2014-11-08 ENCOUNTER — Encounter: Payer: Self-pay | Admitting: Family Medicine

## 2014-11-08 VITALS — BP 130/80 | HR 70 | Temp 97.6°F | Resp 16 | Ht 68.75 in | Wt 155.8 lb

## 2014-11-08 DIAGNOSIS — M159 Polyosteoarthritis, unspecified: Secondary | ICD-10-CM

## 2014-11-08 DIAGNOSIS — Z638 Other specified problems related to primary support group: Secondary | ICD-10-CM

## 2014-11-08 DIAGNOSIS — M150 Primary generalized (osteo)arthritis: Secondary | ICD-10-CM

## 2014-11-08 DIAGNOSIS — F439 Reaction to severe stress, unspecified: Secondary | ICD-10-CM

## 2014-11-08 DIAGNOSIS — M25561 Pain in right knee: Secondary | ICD-10-CM

## 2014-11-08 DIAGNOSIS — G8929 Other chronic pain: Secondary | ICD-10-CM

## 2014-11-08 DIAGNOSIS — G4700 Insomnia, unspecified: Secondary | ICD-10-CM

## 2014-11-08 MED ORDER — DICLOFENAC SODIUM 75 MG PO TBEC: DELAYED_RELEASE_TABLET | ORAL | Status: DC

## 2014-11-08 MED ORDER — GABAPENTIN 300 MG PO CAPS: ORAL_CAPSULE | ORAL | Status: DC

## 2014-11-08 MED ORDER — ZOLPIDEM TARTRATE 10 MG PO TABS: 10.0000 mg | ORAL_TABLET | Freq: Every evening | ORAL | Status: DC | PRN

## 2014-11-08 MED ORDER — TRAMADOL HCL 50 MG PO TABS: ORAL_TABLET | ORAL | Status: DC

## 2014-11-08 NOTE — Patient Instructions (Addendum)
You have severe arthritis especially in the right knee. We will continue current medications and I will see you in 6 months for complete physical exam and fasting labs.  I have prescribed Tramadol for moderate to severe pain; if you decide that you want to see Dr. Gladstone Lighter before your next visit with me, contact the clinic and a referral will be done.

## 2014-11-08 NOTE — Progress Notes (Signed)
Subjective:    Patient ID: Joseph Riddle, male    DOB: May 03, 1956, 58 y.o.   MRN: [id number]  HPI  This 58 y.o. 58 male is here for follow-up re: chronic R knee pain; he reports hx of meniscal tear (buckle -handle type) ~ 5 years ago. He is having increased pain but is very reluctant to consider surgery at this time. Had L great toe surgery Dec 2014; has chronic pain and not pleased about cost of that procedure. Gait is abnormal and he is having lateral R hip pain as well. Pt also having "stinging" sensation on bottoms of feet, R >> L;  heel pressure is aggravating in evening. Current medications relieve discomfort. Gabapentin along w/ zolpidem are effective at bedtime; pt adverse to using topicals because of "the smell". Denies any low back pain. Concerned that he may have diabetic neuropathy; no family hx of DM.  DJD- Having less shoulder pain. Known to have cervical radiculopathy. Had cardiac eval w/  Dr. [name]; stress test adequate to r/o ischemic heart disease. Pt states test was terminated due to increased DBP.  Insomnia- Current medication effective; no adverse effects. Stress of raising 2 children (adopted) under the age of 60. His 52 y.o. son has ADHD.   Patient Active Problem List   Diagnosis Date Noted  . Neck pain on left side 06/19/2014  . Chest pain 06/19/2014  . Palpitations 06/19/2014  . DDD (degenerative disc disease), cervical 07/13/2012  . Degenerative joint disease involving multiple joints 03/10/2012  . Insomnia 03/10/2012    Prior to Admission medications   Medication Sig Start Date End Date Taking? Authorizing Provider  cycloSPORINE (RESTASIS) 0.05 % ophthalmic emulsion Place 1 drop into both eyes 2 (two) times daily.   Yes Historical Provider, MD  diclofenac (VOLTAREN) 75 MG EC tablet TAKE ONE TABLET BY MOUTH TWICE DAILY AFTER MEALS   Yes Barton Fanny, MD  fish oil-omega-3 fatty acids 1000 MG capsule Take 2 g by mouth daily.   Yes Historical Provider, MD    gabapentin (NEURONTIN) 300 MG capsule TAKE 1 CAPSULE AT MIDDAY AND 3 CAPSULES AT BEDTIME   Yes Barton Fanny, MD  Multiple Vitamin (MULTIVITAMIN) tablet Take 1 tablet by mouth daily.   Yes Historical Provider, MD  zolpidem (AMBIEN) 10 MG tablet Take 1 tablet (10 mg total) by mouth at bedtime as needed.   Yes Barton Fanny, MD    History   Social History  . Marital Status: Married    Spouse Name: N/A    Number of Children: N/A  . Years of Education: N/A   Occupational History  . Not on file.   Social History Main Topics  . Smoking status: Former Smoker -- 15 years    Types: Cigarettes    Quit date: 07/24/2000  . Smokeless tobacco: Never Used  . Alcohol Use: No     Comment: very occasionally - mixed drinks  . Drug Use: No  . Sexual Activity: Not on file   Other Topics Concern  . Not on file   Social History Narrative   Married. Education: [employer]. Pt does not exercise.    Review of Systems  Constitutional: Positive for activity change and fatigue. Negative for fever and appetite change.  Respiratory: Negative for cough, chest tightness and shortness of breath.   Cardiovascular: Negative for chest pain, palpitations and leg swelling.  Musculoskeletal: Positive for back pain, joint swelling, arthralgias and gait problem. Negative for myalgias.  Neurological: Negative.  Psychiatric/Behavioral: Positive for sleep disturbance, dysphoric mood and decreased concentration. Negative for behavioral problems, confusion and self-injury.      Objective:   Physical Exam  Constitutional: He is oriented to person, place, and time. He appears well-developed and well-nourished. He appears distressed.  HENT:  Head: Normocephalic and atraumatic.  Right Ear: External ear normal.  Left Ear: External ear normal.  Nose: Nose normal.  Mouth/Throat: Oropharynx is clear and moist.  Eyes: Conjunctivae and EOM are normal. Pupils are equal, round, and reactive to light. No scleral  icterus.  Neck: Normal range of motion. Neck supple. No thyromegaly present.  Cardiovascular: Normal rate and regular rhythm.   Pulmonary/Chest: Effort normal. No respiratory distress.  Musculoskeletal:       Right shoulder: He exhibits decreased range of motion, deformity, spasm and decreased strength. He exhibits no tenderness, no bony tenderness, no crepitus and normal pulse.       Left shoulder: He exhibits decreased range of motion, deformity and spasm. He exhibits no bony tenderness, no crepitus and normal strength.       Right knee: He exhibits decreased range of motion, effusion, deformity, erythema, abnormal alignment, abnormal patellar mobility and bony tenderness. Tenderness found. Medial joint line and lateral joint line tenderness noted.       Left knee: He exhibits decreased range of motion and deformity. He exhibits no swelling, no effusion, no erythema, normal alignment, normal patellar mobility and normal meniscus. No tenderness found.       Cervical back: He exhibits decreased range of motion and spasm.       Thoracic back: Normal.       Lumbar back: He exhibits decreased range of motion and spasm. He exhibits no deformity.  Lymphadenopathy:    He has no cervical adenopathy.  Neurological: He is alert and oriented to person, place, and time. He displays no atrophy. No cranial nerve deficit or sensory deficit. He exhibits normal muscle tone. Gait abnormal. Coordination normal.  Skin: Skin is warm, dry and intact. No ecchymosis, no lesion and no rash noted. He is not diaphoretic. No cyanosis. No pallor.  Psychiatric: His speech is normal and behavior is normal. Judgment and thought content normal. His mood appears anxious. His affect is not inappropriate. Cognition and memory are normal. He exhibits a depressed mood.  Nursing note and vitals reviewed.   [facility] reading (PRIMARY) by  Dr. Leward Quan: R knee- Decreased joint space lateral aspect of joint; degenerative changes at tibial  plateau w/ spurring.     Assessment & Plan:  Chronic knee pain, right - Trial Tramadol.   Plan: DG Knee Complete 4 Views Right, Ambulatory referral to Orthopedic Surgery; strongly encouraged orthopedic evaluation due to chronic pain, joint deformity and gait disturbance which is affecting R hip.  Primary osteoarthritis involving multiple joints - Continue Voltaren; advised OTC topical. Plan: DG Knee Complete 4 Views Right  Insomnia - Continue Gabapentin.  Plan: zolpidem (AMBIEN) 10 MG tablet  Stress at home- Pt is managing home life as best he can given the circumstances.   Meds ordered this encounter  Medications  . diclofenac (VOLTAREN) 75 MG EC tablet    Sig: TAKE ONE TABLET BY MOUTH TWICE DAILY AFTER MEALS    Dispense:  60 tablet    Refill:  11  . gabapentin (NEURONTIN) 300 MG capsule    Sig: TAKE 1 CAPSULE AT MIDDAY AND 3 CAPSULES AT BEDTIME    Dispense:  120 capsule    Refill:  11  .  zolpidem (AMBIEN) 10 MG tablet    Sig: Take 1 tablet (10 mg total) by mouth at bedtime as needed.    Dispense:  30 tablet    Refill:  5  . traMADol (ULTRAM) 50 MG tablet    Sig: Take 1 tablet every 8 hours or 1-2 tabs at bedtime for pain.    Dispense:  60 tablet    Refill:  0

## 2014-11-09 ENCOUNTER — Encounter: Payer: Self-pay | Admitting: Family Medicine

## 2014-11-16 ENCOUNTER — Other Ambulatory Visit: Payer: Self-pay | Admitting: Family Medicine

## 2014-11-20 NOTE — Telephone Encounter (Signed)
Tramadol refill phoned to pharmacy. I increased the quantity to #90 per refill in the event that pt takes 3 tablets daily.

## 2015-04-29 ENCOUNTER — Other Ambulatory Visit: Payer: Self-pay | Admitting: Family Medicine

## 2015-05-01 NOTE — Telephone Encounter (Signed)
Patient is calling to follow up on medication refill. He has an appointment scheduled next week on the 16th for medication refill but he wants to go ahead and see if the medication can get approved. Please call! [phone]

## 2015-05-01 NOTE — Telephone Encounter (Signed)
Left message prescription ready for pick up.

## 2015-05-01 NOTE — Telephone Encounter (Signed)
Can someone else address this in Dr McPherson's absence please?

## 2015-05-01 NOTE — Telephone Encounter (Signed)
Meds ordered this encounter  Medications  . zolpidem (AMBIEN) 10 MG tablet    Sig: TAKE ONE TABLET BY MOUTH AT BEDTIME AS NEEDED    Dispense:  20 tablet    Refill:  0    Rx printed. We'll see him at his scheduled visit.

## 2015-05-01 NOTE — Telephone Encounter (Signed)
This is something he will have to pick up.  At this time.

## 2015-05-09 ENCOUNTER — Encounter: Payer: Self-pay | Admitting: Physician Assistant

## 2015-05-09 ENCOUNTER — Ambulatory Visit (INDEPENDENT_AMBULATORY_CARE_PROVIDER_SITE_OTHER): Payer: BLUE CROSS/BLUE SHIELD | Admitting: Physician Assistant

## 2015-05-09 VITALS — BP 135/84 | HR 64 | Temp 97.7°F | Resp 16 | Ht 68.5 in | Wt 159.2 lb

## 2015-05-09 DIAGNOSIS — M25561 Pain in right knee: Secondary | ICD-10-CM | POA: Diagnosis not present

## 2015-05-09 DIAGNOSIS — G4700 Insomnia, unspecified: Secondary | ICD-10-CM

## 2015-05-09 DIAGNOSIS — M150 Primary generalized (osteo)arthritis: Secondary | ICD-10-CM | POA: Diagnosis not present

## 2015-05-09 DIAGNOSIS — M159 Polyosteoarthritis, unspecified: Secondary | ICD-10-CM | POA: Diagnosis not present

## 2015-05-09 DIAGNOSIS — G8929 Other chronic pain: Secondary | ICD-10-CM | POA: Diagnosis not present

## 2015-05-09 MED ORDER — GABAPENTIN 300 MG PO CAPS: ORAL_CAPSULE | ORAL | Status: DC

## 2015-05-09 MED ORDER — DICLOFENAC SODIUM 75 MG PO TBEC
DELAYED_RELEASE_TABLET | ORAL | Status: DC
Start: 2015-05-09 — End: 2015-10-22

## 2015-05-09 MED ORDER — TRAMADOL HCL 50 MG PO TABS: 50.0000 mg | ORAL_TABLET | Freq: Three times a day (TID) | ORAL | Status: DC | PRN

## 2015-05-09 MED ORDER — ZOLPIDEM TARTRATE 10 MG PO TABS: 10.0000 mg | ORAL_TABLET | Freq: Every evening | ORAL | Status: DC | PRN

## 2015-05-09 NOTE — Patient Instructions (Signed)
I will get in touch with you regarding the knee bracing.

## 2015-05-13 NOTE — Progress Notes (Signed)
[facility] [address] [place] [address] [phone]  Date:  05/09/2015   Name:  Joseph Riddle   DOB:  06-Feb-1956   MRN:  [id number]  PCP:  [name], MD    History of Present Illness:  Joseph Riddle is a 59 y.o. male patient who presents to [facility] for medication refill of his tramadol, zolpidem, diclofenac, and gabapentin.  He states that he has no adverse side effects of his drugs.  He is sleeping better with his zolpidem.  The tramadol continues to help with his knee pain, though he complains that his right knee pain has worsened.  He has no instability or weakness.  He does not use a brace or cane.  He is followed by ortho, who nominates knee replacement but he is nervous about getting his "knee cut off".  His sleep has improved sleep.   Patient Active Problem List   Diagnosis Date Noted  . Neck pain on left side 06/19/2014  . Chest pain 06/19/2014  . Palpitations 06/19/2014  . DDD (degenerative disc disease), cervical 07/13/2012  . Degenerative joint disease involving multiple joints 03/10/2012  . Insomnia 03/10/2012    Past Medical History  Diagnosis Date  . Arthritis     Past Surgical History  Procedure Laterality Date  . Meniscus repair    . Foot surgery Left 10/2013    Dr. [name]    History  Substance Use Topics  . Smoking status: Former Smoker -- 15 years    Types: Cigarettes    Quit date: 07/24/2000  . Smokeless tobacco: Never Used  . Alcohol Use: No     Comment: very occasionally - mixed drinks    Family History  Problem Relation Age of Onset  . Heart disease Maternal Grandfather   . Rheumatic fever Mother   . Heart disease Father     No Known Allergies  Medication list has been reviewed and updated.  Current Outpatient Prescriptions on File Prior to Visit  Medication Sig Dispense Refill  . fish oil-omega-3 fatty acids 1000 MG capsule Take 2 g by mouth daily.    . Multiple Vitamin (MULTIVITAMIN) tablet Take 1  tablet by mouth daily.    . cycloSPORINE (RESTASIS) 0.05 % ophthalmic emulsion Place 1 drop into both eyes 2 (two) times daily.     No current facility-administered medications on file prior to visit.    ROS ROS otherwise unremarkable unless listed otherwise  Physical Examination: BP 135/84 mmHg  Pulse 64  Temp(Src) 97.7 F (36.5 C) (Oral)  Resp 16  Ht 5' 8.5" (1.74 m)  Wt 159 lb 3.2 oz (72.213 kg)  BMI 23.85 kg/m2  SpO2 100% Ideal Body Weight: Weight in (lb) to have BMI = 25: 166.5  Physical Exam  Constitutional: He is oriented to person, place, and time. He appears well-developed and well-nourished. No distress.  HENT:  Head: Normocephalic and atraumatic.  Eyes: Conjunctivae are normal. Pupils are equal, round, and reactive to light.  Neck: No thyromegaly present.  Cardiovascular: Normal rate, regular rhythm and normal heart sounds.  Exam reveals no friction rub.   No murmur heard. Pulmonary/Chest: Effort normal and breath sounds normal. No respiratory distress. He has no wheezes.  Musculoskeletal:  Normal ROM of shoulders bilaterally.  Mild pain at the 110 with shoulder external rotation, but he is able to complete rom.  No tenderness upon palpation.  R knee with varum deformity.  Crepitus appreciated.  No tenderness upon palpation.  Normal strength.. No crepitus in hip.  Normal hip ROM      Neurological: He is alert and oriented to person, place, and time.  Skin: Skin is warm and dry. He is not diaphoretic.  Psychiatric: He has a normal mood and affect. His behavior is normal.     Assessment and Plan: 59 year old male is here today for medication refill.  These pains are otherwise stable and followed by ortho.  He is considering knee replacement surgery in September.  Advised to call in for medication refill of the [place], and we can see him in one year.  Shoulder pain has improved since last visit.  1. Primary osteoarthritis involving multiple joints - traMADol  (ULTRAM) 50 MG tablet; Take 1 tablet (50 mg total) by mouth every 8 (eight) hours as needed.  Dispense: 90 tablet; Refill: 5 - gabapentin (NEURONTIN) 300 MG capsule; TAKE 1 CAPSULE AT MIDDAY AND 3 CAPSULES AT BEDTIME  Dispense: 120 capsule; Refill: 11 - diclofenac (VOLTAREN) 75 MG EC tablet; TAKE ONE TABLET BY MOUTH TWICE DAILY AFTER MEALS  Dispense: 60 tablet; Refill: 11  2. Chronic knee pain, right - traMADol (ULTRAM) 50 MG tablet; Take 1 tablet (50 mg total) by mouth every 8 (eight) hours as needed.  Dispense: 90 tablet; Refill: 5 - gabapentin (NEURONTIN) 300 MG capsule; TAKE 1 CAPSULE AT MIDDAY AND 3 CAPSULES AT BEDTIME  Dispense: 120 capsule; Refill: 11 - diclofenac (VOLTAREN) 75 MG EC tablet; TAKE ONE TABLET BY MOUTH TWICE DAILY AFTER MEALS  Dispense: 60 tablet; Refill: 11  3. Osteoarthritis of multiple joints, unspecified osteoarthritis type - diclofenac (VOLTAREN) 75 MG EC tablet; TAKE ONE TABLET BY MOUTH TWICE DAILY AFTER MEALS  Dispense: 60 tablet; Refill: 11  4. Insomnia - zolpidem (AMBIEN) 10 MG tablet; Take 1 tablet (10 mg total) by mouth at bedtime as needed.  Dispense: 30 tablet; Refill: 5   Ivar Drape, PA-C [facility] 05/13/2015 2:58 PM

## 2015-06-21 ENCOUNTER — Telehealth: Payer: Self-pay

## 2015-06-21 NOTE — Telephone Encounter (Signed)
Patient is returning a missed phone call. Patient phone: [phone]

## 2015-06-21 NOTE — Telephone Encounter (Signed)
Advised 104.

## 2015-07-21 IMAGING — CT CT MAXILLOFACIAL W/O CM
3 of 5 series · 10 of 47 positions shown, 11 images · non-contrast
Comparison: None.

CLINICAL DATA: Left maxillary pain, pressure. Recurrent sinus
infections.

EXAM:
CT MAXILLOFACIAL WITHOUT CONTRAST
TECHNIQUE: Multidetector CT imaging of the maxillofacial structures was
performed. Multiplanar CT image reconstructions were also generated.
A small metallic BB was placed on the right temple in order to
reliably differentiate right from left.

[Series 4: max bone · axial · 0.33mm/px · z∈[-116,-28]mm · 4 of 50 slices shown, 5 images]
[im 8/50  brain]
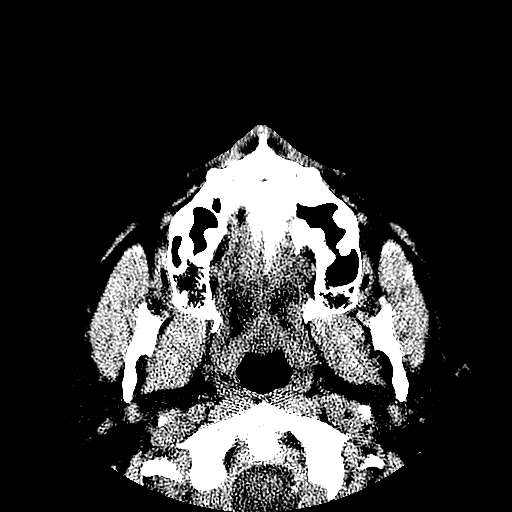
[im 8/50  bone]
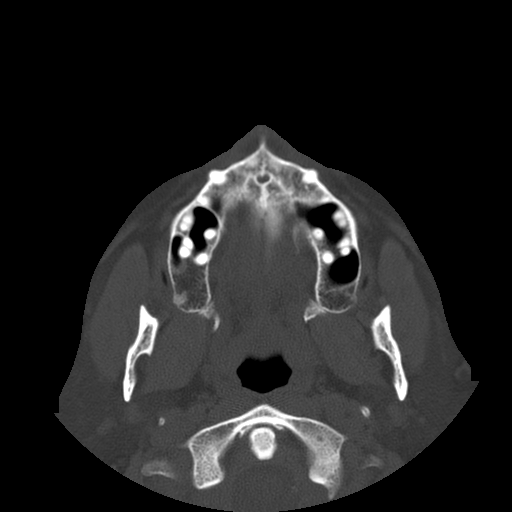
[im 22/50  bone]
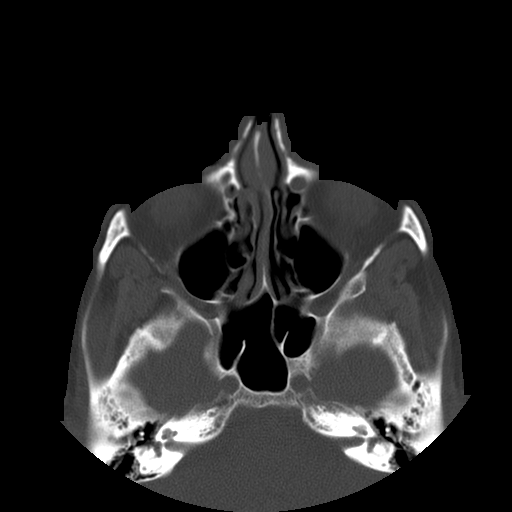
[im 29/50  bone]
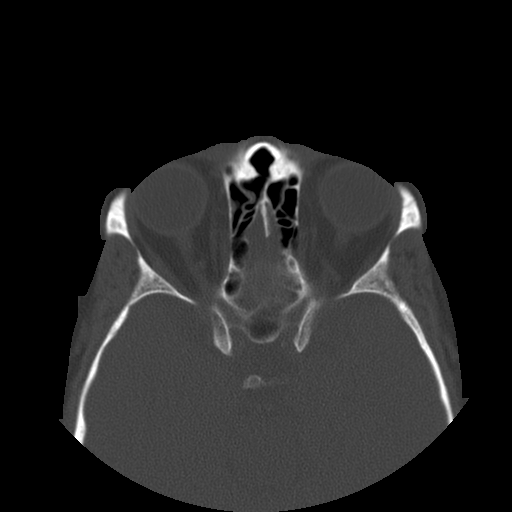
[im 43/50  bone]
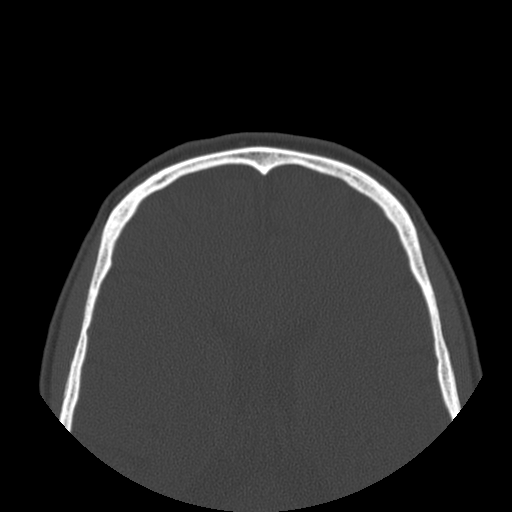

[Series 500: cor soft · coronal · 0.33mm/px · 3 of 86 slices shown]
[im 29/86  bone]
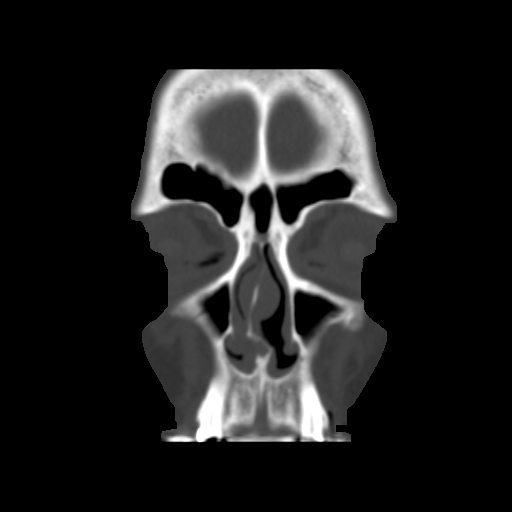
[im 38/86  bone]
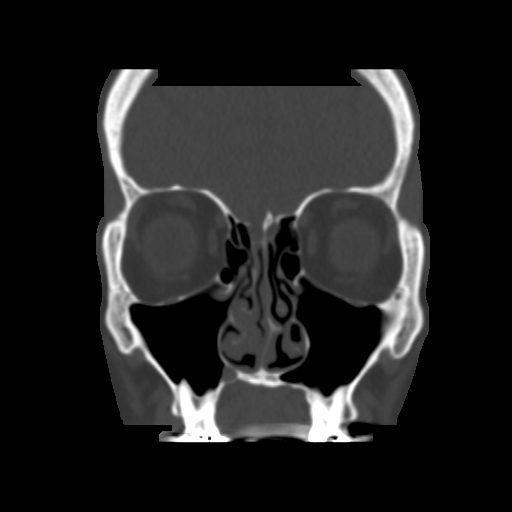
[im 48/86  bone]
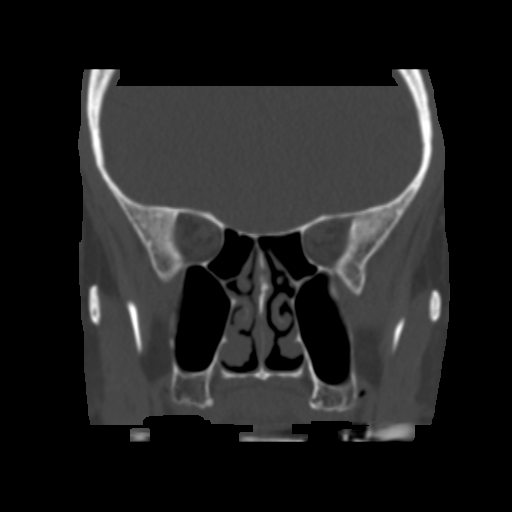

[Series 501: sag soft · sagittal · 0.17mm/px · 3 of 86 slices shown]
[im 29/86  bone]
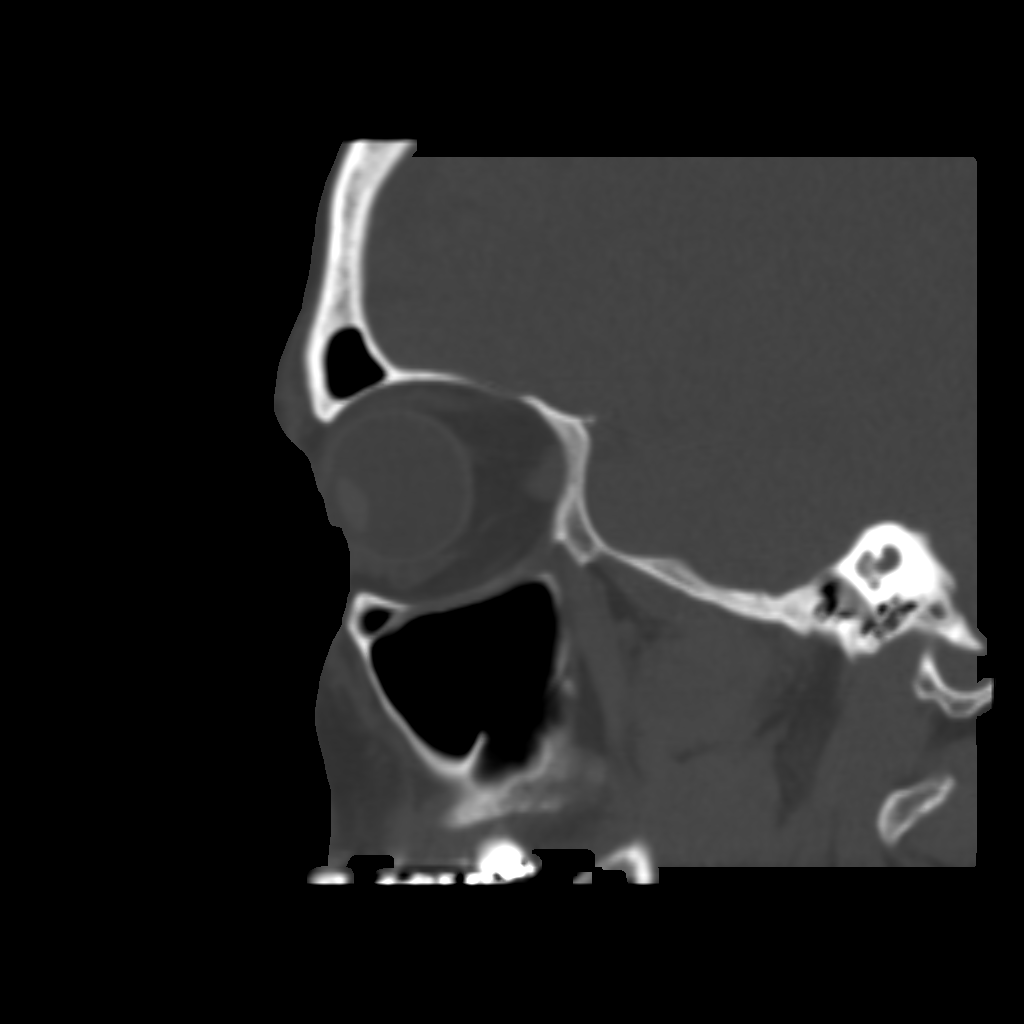
[im 43/86  bone]
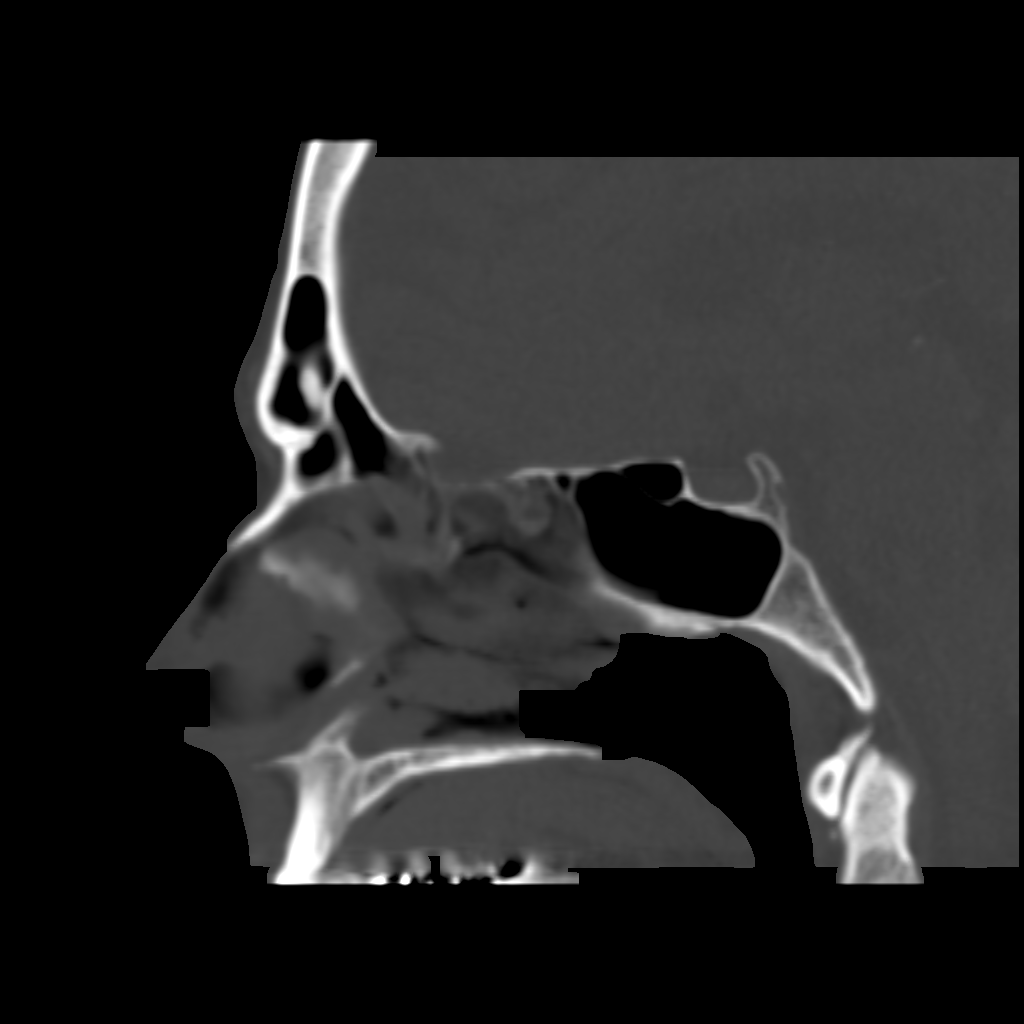
[im 57/86  bone]
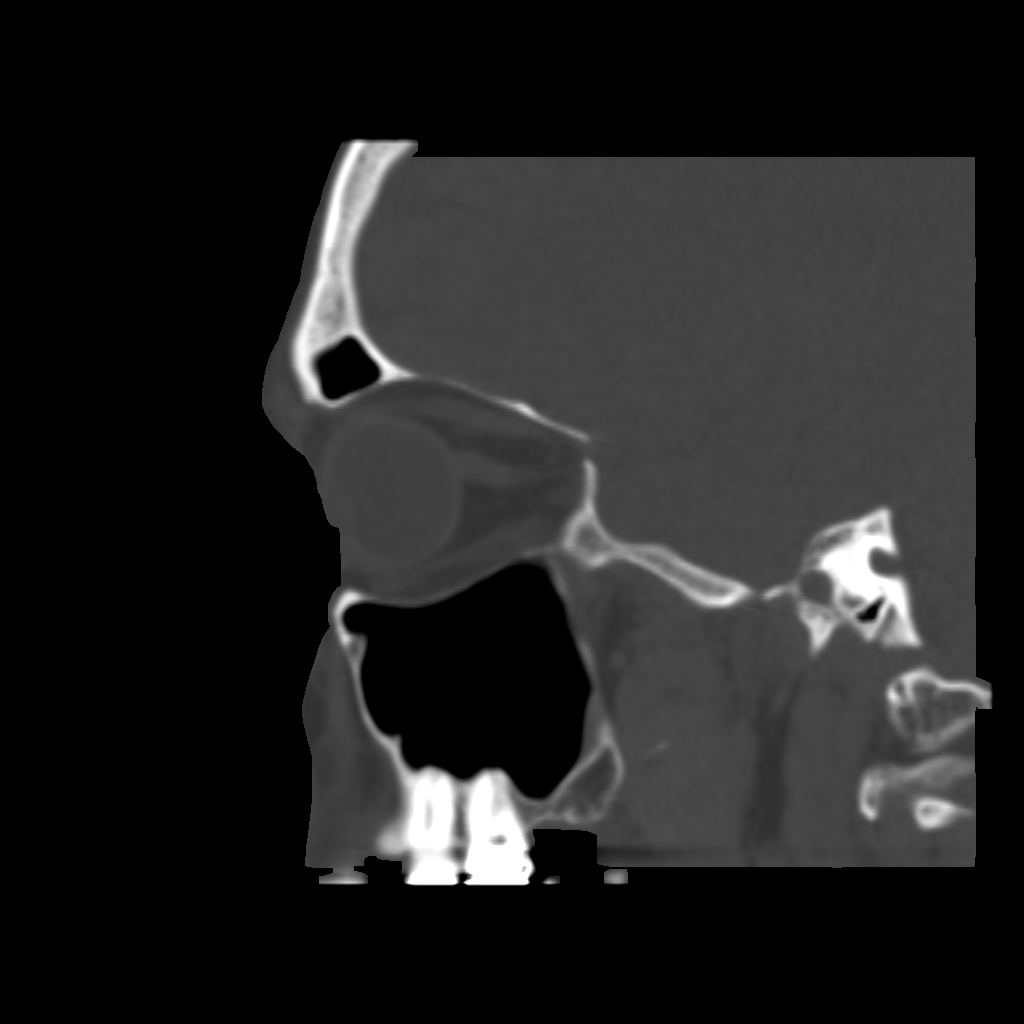

[10 of 47 positions shown; findings below may reference images not displayed]

FINDINGS: Slight areas of mucosal thickening within the maxillary sinuses,
scattered ethmoid air cells and right sphenoid sinus. No air-fluid
levels. Orbital soft tissues are unremarkable. No acute bony
abnormality.
IMPRESSION: Slight chronic sinusitis changes.

## 2015-09-24 DIAGNOSIS — I255 Ischemic cardiomyopathy: Secondary | ICD-10-CM

## 2015-09-24 DIAGNOSIS — E785 Hyperlipidemia, unspecified: Secondary | ICD-10-CM

## 2015-09-24 HISTORY — DX: Hyperlipidemia, unspecified: E78.5

## 2015-09-24 HISTORY — DX: Ischemic cardiomyopathy: I25.5

## 2015-10-05 ENCOUNTER — Encounter (HOSPITAL_COMMUNITY)
Admission: EM | Disposition: A | Discharge status: Home / Self Care | Payer: BLUE CROSS/BLUE SHIELD | Source: Home / Self Care | Attending: Cardiology

## 2015-10-05 ENCOUNTER — Encounter (HOSPITAL_COMMUNITY): Payer: Self-pay | Admitting: Cardiology

## 2015-10-05 ENCOUNTER — Emergency Department (HOSPITAL_COMMUNITY): Payer: BLUE CROSS/BLUE SHIELD

## 2015-10-05 ENCOUNTER — Inpatient Hospital Stay (HOSPITAL_COMMUNITY)
Admission: EM | Admit: 2015-10-05 | Discharge: 2015-10-08 | DRG: 247 | Disposition: A | Discharge status: Home / Self Care | Payer: BLUE CROSS/BLUE SHIELD | Attending: Cardiology | Admitting: Cardiology

## 2015-10-05 DIAGNOSIS — I2510 Atherosclerotic heart disease of native coronary artery without angina pectoris: Secondary | ICD-10-CM | POA: Diagnosis present

## 2015-10-05 DIAGNOSIS — I2102 ST elevation (STEMI) myocardial infarction involving left anterior descending coronary artery: Principal | ICD-10-CM | POA: Diagnosis present

## 2015-10-05 DIAGNOSIS — E876 Hypokalemia: Secondary | ICD-10-CM | POA: Diagnosis not present

## 2015-10-05 DIAGNOSIS — I209 Angina pectoris, unspecified: Secondary | ICD-10-CM | POA: Diagnosis not present

## 2015-10-05 DIAGNOSIS — I25110 Atherosclerotic heart disease of native coronary artery with unstable angina pectoris: Secondary | ICD-10-CM

## 2015-10-05 DIAGNOSIS — I2129 ST elevation (STEMI) myocardial infarction involving other sites: Secondary | ICD-10-CM

## 2015-10-05 DIAGNOSIS — E875 Hyperkalemia: Secondary | ICD-10-CM | POA: Diagnosis not present

## 2015-10-05 DIAGNOSIS — E785 Hyperlipidemia, unspecified: Secondary | ICD-10-CM | POA: Diagnosis present

## 2015-10-05 DIAGNOSIS — Z79899 Other long term (current) drug therapy: Secondary | ICD-10-CM

## 2015-10-05 DIAGNOSIS — Z791 Long term (current) use of non-steroidal anti-inflammatories (NSAID): Secondary | ICD-10-CM | POA: Diagnosis not present

## 2015-10-05 DIAGNOSIS — Z955 Presence of coronary angioplasty implant and graft: Secondary | ICD-10-CM | POA: Diagnosis not present

## 2015-10-05 DIAGNOSIS — M1990 Unspecified osteoarthritis, unspecified site: Secondary | ICD-10-CM | POA: Diagnosis present

## 2015-10-05 DIAGNOSIS — M159 Polyosteoarthritis, unspecified: Secondary | ICD-10-CM | POA: Diagnosis present

## 2015-10-05 DIAGNOSIS — Z9861 Coronary angioplasty status: Secondary | ICD-10-CM

## 2015-10-05 DIAGNOSIS — Z8249 Family history of ischemic heart disease and other diseases of the circulatory system: Secondary | ICD-10-CM | POA: Diagnosis not present

## 2015-10-05 DIAGNOSIS — I959 Hypotension, unspecified: Secondary | ICD-10-CM | POA: Diagnosis not present

## 2015-10-05 DIAGNOSIS — R079 Chest pain, unspecified: Secondary | ICD-10-CM | POA: Diagnosis present

## 2015-10-05 DIAGNOSIS — I213 ST elevation (STEMI) myocardial infarction of unspecified site: Secondary | ICD-10-CM

## 2015-10-05 DIAGNOSIS — I255 Ischemic cardiomyopathy: Secondary | ICD-10-CM | POA: Diagnosis present

## 2015-10-05 DIAGNOSIS — Z87891 Personal history of nicotine dependence: Secondary | ICD-10-CM

## 2015-10-05 DIAGNOSIS — G4700 Insomnia, unspecified: Secondary | ICD-10-CM

## 2015-10-05 HISTORY — DX: ST elevation (STEMI) myocardial infarction of unspecified site: I21.3

## 2015-10-05 HISTORY — PX: CARDIAC CATHETERIZATION: SHX172

## 2015-10-05 LAB — BASIC METABOLIC PANEL
ANION GAP: 13 (ref 5–15)
BUN: 14 mg/dL (ref 6–20)
CO2: 26 mmol/L (ref 22–32)
CREATININE: 1.12 mg/dL (ref 0.61–1.24)
Calcium: 9.6 mg/dL (ref 8.9–10.3)
Chloride: 102 mmol/L (ref 101–111)
GFR calc Af Amer: >60 mL/min (ref >60)
GFR calc non Af Amer: >60 mL/min (ref >60)
GLUCOSE: 142 mg/dL — AB (ref 65–99)
Potassium: 3.6 mmol/L (ref 3.5–5.1)
Sodium: 141 mmol/L (ref 135–145)

## 2015-10-05 LAB — CBC
HCT: 50.5 % (ref 39.0–52.0)
HEMATOCRIT: 45.3 % (ref 39.0–52.0)
HEMOGLOBIN: 18 g/dL — AB (ref 13.0–17.0)
HEMOGLOBIN: 15.8 g/dL (ref 13.0–17.0)
MCH: 32.9 pg (ref 26.0–34.0)
MCH: 32 pg (ref 26.0–34.0)
MCHC: 35.6 g/dL (ref 30.0–36.0)
MCHC: 34.9 g/dL (ref 30.0–36.0)
MCV: 92.3 fL (ref 78.0–100.0)
MCV: 91.9 fL (ref 78.0–100.0)
Platelets: 369 10*3/uL (ref 150–400)
Platelets: 276 10*3/uL (ref 150–400)
RBC: 5.47 MIL/uL (ref 4.22–5.81)
RBC: 4.93 MIL/uL (ref 4.22–5.81)
RDW: 12.2 % (ref 11.5–15.5)
RDW: 12.3 % (ref 11.5–15.5)
WBC: 12.9 10*3/uL — AB (ref 4.0–10.5)
WBC: 17.4 10*3/uL — ABNORMAL HIGH (ref 4.0–10.5)

## 2015-10-05 LAB — I-STAT TROPONIN, ED: Troponin i, poc: 0.82 ng/mL (ref 0.00–0.08)

## 2015-10-05 LAB — MRSA PCR SCREENING: MRSA BY PCR: NEGATIVE

## 2015-10-05 LAB — COMPREHENSIVE METABOLIC PANEL
ALBUMIN: 3.4 g/dL — AB (ref 3.5–5.0)
ALK PHOS: 45 U/L (ref 38–126)
ALT: 96 U/L — AB (ref 17–63)
AST: 538 U/L — AB (ref 15–41)
Anion gap: 10 (ref 5–15)
BILIRUBIN TOTAL: 0.8 mg/dL (ref 0.3–1.2)
BUN: 12 mg/dL (ref 6–20)
CO2: 22 mmol/L (ref 22–32)
Calcium: 8 mg/dL — ABNORMAL LOW (ref 8.9–10.3)
Chloride: 110 mmol/L (ref 101–111)
Creatinine, Ser: 0.83 mg/dL (ref 0.61–1.24)
GFR calc Af Amer: >60 mL/min (ref >60)
GFR calc non Af Amer: >60 mL/min (ref >60)
GLUCOSE: 136 mg/dL — AB (ref 65–99)
Potassium: 3.5 mmol/L (ref 3.5–5.1)
Sodium: 142 mmol/L (ref 135–145)
TOTAL PROTEIN: 5.3 g/dL — AB (ref 6.5–8.1)

## 2015-10-05 LAB — TROPONIN I: Troponin I: >65 ng/mL (ref <0.031)

## 2015-10-05 LAB — TSH: TSH: 0.397 u[IU]/mL (ref 0.350–4.500)

## 2015-10-05 LAB — BRAIN NATRIURETIC PEPTIDE: B NATRIURETIC PEPTIDE 5: 183.4 pg/mL — AB (ref 0.0–100.0)

## 2015-10-05 SURGERY — LEFT HEART CATH
Anesthesia: LOCAL

## 2015-10-05 MED ORDER — NITROGLYCERIN 0.4 MG SL SUBL
0.4000 mg | SUBLINGUAL_TABLET | SUBLINGUAL | Status: DC | PRN
  Administered 2015-10-05 (×2): 0.4 mg via SUBLINGUAL
  Filled 2015-10-05: qty 1

## 2015-10-05 MED ORDER — FENTANYL CITRATE (PF) 100 MCG/2ML IJ SOLN
INTRAMUSCULAR | Status: AC
  Filled 2015-10-05: qty 4

## 2015-10-05 MED ORDER — ASPIRIN 81 MG PO CHEW
243.0000 mg | CHEWABLE_TABLET | Freq: Once | ORAL | Status: AC
  Administered 2015-10-05: 243 mg via ORAL

## 2015-10-05 MED ORDER — FENTANYL CITRATE (PF) 100 MCG/2ML IJ SOLN
100.0000 ug | Freq: Once | INTRAMUSCULAR | Status: AC
  Administered 2015-10-05: 100 ug via INTRAVENOUS
  Filled 2015-10-05: qty 2

## 2015-10-05 MED ORDER — MIDAZOLAM HCL 2 MG/2ML IJ SOLN
INTRAMUSCULAR | Status: AC
  Filled 2015-10-05: qty 4

## 2015-10-05 MED ORDER — SODIUM CHLORIDE 0.9 % IJ SOLN: 3.0000 mL | INTRAMUSCULAR | Status: DC | PRN

## 2015-10-05 MED ORDER — NITROGLYCERIN 1 MG/10 ML FOR IR/CATH LAB
INTRA_ARTERIAL | Status: DC | PRN
  Administered 2015-10-05: 200 ug

## 2015-10-05 MED ORDER — ATORVASTATIN CALCIUM 80 MG PO TABS
80.0000 mg | ORAL_TABLET | Freq: Every day | ORAL | Status: DC
  Administered 2015-10-05 – 2015-10-07 (×3): 80 mg via ORAL
  Filled 2015-10-05 (×3): qty 1

## 2015-10-05 MED ORDER — VERAPAMIL HCL 2.5 MG/ML IV SOLN
INTRAVENOUS | Status: DC | PRN
  Administered 2015-10-05: 15:00:00 via INTRA_ARTERIAL

## 2015-10-05 MED ORDER — LIDOCAINE HCL (PF) 1 % IJ SOLN
INTRAMUSCULAR | Status: AC
  Filled 2015-10-05: qty 30

## 2015-10-05 MED ORDER — ASPIRIN 81 MG PO CHEW
CHEWABLE_TABLET | ORAL | Status: AC
  Filled 2015-10-05: qty 3

## 2015-10-05 MED ORDER — CETYLPYRIDINIUM CHLORIDE 0.05 % MT LIQD
7.0000 mL | Freq: Two times a day (BID) | OROMUCOSAL | Status: DC
  Administered 2015-10-06: 7 mL via OROMUCOSAL

## 2015-10-05 MED ORDER — FENTANYL CITRATE (PF) 100 MCG/2ML IJ SOLN
INTRAMUSCULAR | Status: DC | PRN
  Administered 2015-10-05: 50 ug via INTRAVENOUS

## 2015-10-05 MED ORDER — SODIUM CHLORIDE 0.9 % IJ SOLN
3.0000 mL | Freq: Two times a day (BID) | INTRAMUSCULAR | Status: DC
  Administered 2015-10-06 – 2015-10-07 (×3): 3 mL via INTRAVENOUS

## 2015-10-05 MED ORDER — PANTOPRAZOLE SODIUM 40 MG PO TBEC
40.0000 mg | DELAYED_RELEASE_TABLET | Freq: Every day | ORAL | Status: DC
  Administered 2015-10-05 – 2015-10-08 (×4): 40 mg via ORAL
  Filled 2015-10-05 (×4): qty 1

## 2015-10-05 MED ORDER — NITROGLYCERIN 1 MG/10 ML FOR IR/CATH LAB
INTRA_ARTERIAL | Status: DC | PRN
  Administered 2015-10-05: 15:00:00

## 2015-10-05 MED ORDER — CARVEDILOL 3.125 MG PO TABS
3.1250 mg | ORAL_TABLET | Freq: Two times a day (BID) | ORAL | Status: DC
  Administered 2015-10-05 – 2015-10-06 (×3): 3.125 mg via ORAL
  Filled 2015-10-05 (×3): qty 1

## 2015-10-05 MED ORDER — MORPHINE SULFATE (PF) 2 MG/ML IV SOLN
2.0000 mg | INTRAVENOUS | Status: DC | PRN
  Administered 2015-10-05 (×2): 2 mg via INTRAVENOUS
  Filled 2015-10-05 (×2): qty 1

## 2015-10-05 MED ORDER — SODIUM CHLORIDE 0.9 % IV SOLN: 250.0000 mL | INTRAVENOUS | Status: DC | PRN

## 2015-10-05 MED ORDER — SODIUM CHLORIDE 0.9 % WEIGHT BASED INFUSION
1.0000 mL/kg/h | INTRAVENOUS | Status: AC
  Administered 2015-10-05: 1 mL/kg/h via INTRAVENOUS

## 2015-10-05 MED ORDER — ZOLPIDEM TARTRATE 5 MG PO TABS
5.0000 mg | ORAL_TABLET | Freq: Every evening | ORAL | Status: DC | PRN
  Administered 2015-10-05 – 2015-10-08 (×3): 5 mg via ORAL
  Filled 2015-10-05 (×3): qty 1

## 2015-10-05 MED ORDER — NITROGLYCERIN 1 MG/10 ML FOR IR/CATH LAB
INTRA_ARTERIAL | Status: AC
  Filled 2015-10-05: qty 10

## 2015-10-05 MED ORDER — ONDANSETRON HCL 4 MG/2ML IJ SOLN
4.0000 mg | Freq: Four times a day (QID) | INTRAMUSCULAR | Status: DC | PRN
  Administered 2015-10-05: 4 mg via INTRAVENOUS
  Filled 2015-10-05 (×2): qty 2

## 2015-10-05 MED ORDER — NITROGLYCERIN 2 % TD OINT
1.0000 [in_us] | TOPICAL_OINTMENT | Freq: Four times a day (QID) | TRANSDERMAL | Status: DC
  Administered 2015-10-05: 1 [in_us] via TOPICAL

## 2015-10-05 MED ORDER — TICAGRELOR 90 MG PO TABS
ORAL_TABLET | ORAL | Status: DC | PRN
  Administered 2015-10-05: 180 mg via ORAL

## 2015-10-05 MED ORDER — HEPARIN SODIUM (PORCINE) 5000 UNIT/ML IJ SOLN
4000.0000 [IU] | Freq: Once | INTRAMUSCULAR | Status: AC
  Administered 2015-10-05: 4000 [IU] via INTRAVENOUS
  Filled 2015-10-05: qty 1

## 2015-10-05 MED ORDER — HEPARIN BOLUS VIA INFUSION: 4000.0000 [IU] | Freq: Once | INTRAVENOUS | Status: DC

## 2015-10-05 MED ORDER — HEPARIN SODIUM (PORCINE) 5000 UNIT/ML IJ SOLN
5000.0000 [IU] | Freq: Three times a day (TID) | INTRAMUSCULAR | Status: DC
  Administered 2015-10-05 – 2015-10-07 (×5): 5000 [IU] via SUBCUTANEOUS
  Filled 2015-10-05 (×5): qty 1

## 2015-10-05 MED ORDER — VERAPAMIL HCL 2.5 MG/ML IV SOLN
INTRAVENOUS | Status: AC
  Filled 2015-10-05: qty 2

## 2015-10-05 MED ORDER — HEPARIN SODIUM (PORCINE) 5000 UNIT/ML IJ SOLN: 5000.0000 [IU] | Freq: Three times a day (TID) | INTRAMUSCULAR | Status: DC

## 2015-10-05 MED ORDER — ASPIRIN 81 MG PO CHEW
81.0000 mg | CHEWABLE_TABLET | Freq: Every day | ORAL | Status: DC
  Administered 2015-10-06 – 2015-10-08 (×3): 81 mg via ORAL
  Filled 2015-10-05 (×3): qty 1

## 2015-10-05 MED ORDER — SODIUM CHLORIDE 0.9 % IV SOLN
250.0000 mg | INTRAVENOUS | Status: DC | PRN
  Administered 2015-10-05: 1.75 mg/kg/h via INTRAVENOUS

## 2015-10-05 MED ORDER — ACETAMINOPHEN 325 MG PO TABS: 650.0000 mg | ORAL_TABLET | ORAL | Status: DC | PRN

## 2015-10-05 MED ORDER — BIVALIRUDIN 250 MG IV SOLR
INTRAVENOUS | Status: AC
  Filled 2015-10-05: qty 250

## 2015-10-05 MED ORDER — ONE-DAILY MULTI VITAMINS PO TABS
1.0000 | ORAL_TABLET | Freq: Every day | ORAL | Status: DC
  Administered 2015-10-06 – 2015-10-08 (×3): 1 via ORAL
  Filled 2015-10-05 (×3): qty 1

## 2015-10-05 MED ORDER — TICAGRELOR 90 MG PO TABS
90.0000 mg | ORAL_TABLET | Freq: Two times a day (BID) | ORAL | Status: DC
  Administered 2015-10-05 – 2015-10-08 (×6): 90 mg via ORAL
  Filled 2015-10-05 (×6): qty 1

## 2015-10-05 MED ORDER — MIDAZOLAM HCL 2 MG/2ML IJ SOLN
INTRAMUSCULAR | Status: DC | PRN
  Administered 2015-10-05: 1 mg via INTRAVENOUS

## 2015-10-05 MED ORDER — VERAPAMIL HCL 2.5 MG/ML IV SOLN
INTRAVENOUS | Status: DC | PRN
  Administered 2015-10-05 (×2): 200 ug via INTRACORONARY

## 2015-10-05 MED ORDER — GABAPENTIN 300 MG PO CAPS
300.0000 mg | ORAL_CAPSULE | Freq: Every day | ORAL | Status: DC
  Administered 2015-10-05 – 2015-10-07 (×3): 300 mg via ORAL
  Filled 2015-10-05 (×3): qty 1

## 2015-10-05 MED ORDER — BIVALIRUDIN BOLUS VIA INFUSION - CUPID
INTRAVENOUS | Status: DC | PRN
  Administered 2015-10-05 (×2): 52.725 mg via INTRAVENOUS

## 2015-10-05 SURGICAL SUPPLY — 21 items
BALLN TREK RX 2.5X15 (BALLOONS) ×3
BALLN ~~LOC~~ EUPHORA RX 3.25X12 (BALLOONS) ×3
BALLN ~~LOC~~ EUPHORA RX 3.5X8 (BALLOONS) ×3
BALLOON TREK RX 2.5X15 (BALLOONS) IMPLANT
BALLOON ~~LOC~~ EUPHORA RX 3.25X12 (BALLOONS) IMPLANT
BALLOON ~~LOC~~ EUPHORA RX 3.5X8 (BALLOONS) IMPLANT
CATH INFINITI 5 FR JL3.5 (CATHETERS) ×3 IMPLANT
CATH INFINITI 5FR ANG PIGTAIL (CATHETERS) ×3 IMPLANT
CATH INFINITI JR4 5F (CATHETERS) ×3 IMPLANT
CATH VISTA GUIDE 6FR XBLAD3.5 (CATHETERS) ×1 IMPLANT
DEVICE RAD COMP TR BAND LRG (VASCULAR PRODUCTS) ×3 IMPLANT
GLIDESHEATH SLEND SS 6F .021 (SHEATH) ×3 IMPLANT
KIT ENCORE 26 ADVANTAGE (KITS) ×1 IMPLANT
KIT HEART LEFT (KITS) ×3 IMPLANT
PACK CARDIAC CATHETERIZATION (CUSTOM PROCEDURE TRAY) ×3 IMPLANT
STENT PROMUS PREM MR 3.0X20 (Permanent Stent) ×1 IMPLANT
SYR MEDRAD MARK V 150ML (SYRINGE) ×3 IMPLANT
TRANSDUCER W/STOPCOCK (MISCELLANEOUS) ×3 IMPLANT
TUBING CIL FLEX 10 FLL-RA (TUBING) ×3 IMPLANT
WIRE ASAHI PROWATER 180CM (WIRE) ×1 IMPLANT
WIRE SAFE-T 1.5MM-J .035X260CM (WIRE) ×3 IMPLANT

## 2015-10-05 NOTE — Progress Notes (Signed)
Patient has continued to have chest pain that was relieved by nitro sublingual x 2 for a few minutes and then it came back 2/10. Morphine was given also. Lab reported a critical troponin of > 65. Cards fellow was notified of results, including the chest pain and the medications. 12 lead EKG done and was reviewed by the Cards fellow. Patient states that pain is much better but still remains at 1 or 2 out of 10. Will continue to monitor closely and notify MD as needed.

## 2015-10-05 NOTE — Progress Notes (Signed)
   10/05/15 1400  Clinical Encounter Type  Visited With Patient;Family  Visit Type Initial;Code;ED  Referral From Nurse  Consult/Referral To Chaplain  Spiritual Encounters  Spiritual Needs Emotional  Stress Factors  Family Stress Factors Lack of knowledge;Health changes;Major life changes  [facility] responded to CODE STEMI; briefly met pt and reassured that family will be escorted to waiting area; met family in ED lobby and escorted to 2nd floor waiting area and notified MD/STEMI team of their location; [facility] available for additional support. 2:37 PM Gwynn Burly

## 2015-10-05 NOTE — ED Provider Notes (Signed)
CSN: [id number]     Arrival date & time 10/05/15  1255 History   First MD Initiated Contact with Patient 10/05/15 1308     Chief Complaint  Patient presents with  . Chest Pain     (Consider location/radiation/quality/duration/timing/severity/associated sxs/prior Treatment) HPI Comments: 59 year old male with a history of arthritis who presents with chest pain. One hour prior to arrival, the patient began having left-sided chest pain that has become severe and he has had some vomiting and shortness of breath associated with it. He had intermittent episodes of chest pain for the past couple of days but never this severe or long-lasting. Patient states he took 1 aspirin on the way here. He denies any cough/cold symptoms, fevers, or recent illness. No abdominal pain. No bloody stools. Family history notable for father with CAD at 54.  Patient is a 59 y.o. male presenting with chest pain. The history is provided by the patient.  Chest Pain   Past Medical History  Diagnosis Date  . Arthritis    Past Surgical History  Procedure Laterality Date  . Meniscus repair    . Foot surgery Left 10/2013    Dr. [name]   Family History  Problem Relation Age of Onset  . Heart disease Maternal Grandfather   . Rheumatic fever Mother   . Heart disease Father    Social History  Substance Use Topics  . Smoking status: Former Smoker -- 15 years    Types: Cigarettes    Quit date: 07/24/2000  . Smokeless tobacco: Never Used  . Alcohol Use: No     Comment: very occasionally - mixed drinks    Review of Systems  Cardiovascular: Positive for chest pain.   10 Systems reviewed and are negative for acute change except as noted in the HPI.    Allergies  Review of patient's allergies indicates no known allergies.  Home Medications   Prior to Admission medications   Medication Sig Start Date End Date Taking? Authorizing Provider  cycloSPORINE (RESTASIS) 0.05 % ophthalmic emulsion Place 1  drop into both eyes 2 (two) times daily.    Historical Provider, MD  diclofenac (VOLTAREN) 75 MG EC tablet TAKE ONE TABLET BY MOUTH TWICE DAILY AFTER MEALS 05/09/15   Dorian Heckle English, PA  fish oil-omega-3 fatty acids 1000 MG capsule Take 2 g by mouth daily.    Historical Provider, MD  gabapentin (NEURONTIN) 300 MG capsule TAKE 1 CAPSULE AT MIDDAY AND 3 CAPSULES AT BEDTIME 05/09/15   Dorian Heckle English, PA  Multiple Vitamin (MULTIVITAMIN) tablet Take 1 tablet by mouth daily.    Historical Provider, MD  traMADol (ULTRAM) 50 MG tablet Take 1 tablet (50 mg total) by mouth every 8 (eight) hours as needed. 05/09/15   Dorian Heckle English, PA  zolpidem (AMBIEN) 10 MG tablet Take 1 tablet (10 mg total) by mouth at bedtime as needed. 05/09/15   Dorian Heckle English, PA   Pulse 76 Physical Exam  Constitutional: He is oriented to person, place, and time. He appears well-developed and well-nourished.  In moderate distress due to pain  HENT:  Head: Normocephalic and atraumatic.  Moist mucous membranes  Eyes: Conjunctivae are normal. Pupils are equal, round, and reactive to light.  Neck: Neck supple.  Cardiovascular: Normal rate, regular rhythm and normal heart sounds.   No murmur heard. Pulmonary/Chest: Effort normal and breath sounds normal.  Abdominal: Soft. Bowel sounds are normal. He exhibits no distension. There is no tenderness.  Musculoskeletal: He exhibits no edema.  Neurological: He is alert and oriented to person, place, and time.  Fluent speech  Skin: Skin is warm and dry.  Psychiatric: Judgment normal.  Distressed  Nursing note and vitals reviewed.   ED Course  .Critical Care Performed by: Sharlett Iles Authorized by: Sharlett Iles Total critical care time: 35 minutes Critical care time was exclusive of separately billable procedures and treating other patients. Critical care was necessary to treat or prevent imminent or life-threatening deterioration of the  following conditions: cardiac failure. Critical care was time spent personally by me on the following activities: development of treatment plan with patient or surrogate, discussions with consultants, evaluation of patient's response to treatment, examination of patient, obtaining history from patient or surrogate, ordering and performing treatments and interventions, ordering and review of laboratory studies, ordering and review of radiographic studies and re-evaluation of patient's condition.   (including critical care time) Labs Review Labs Reviewed  CBC - Abnormal; Notable for the following:    WBC 12.9 (*)    Hemoglobin 18.0 (*)    All other components within normal limits  I-STAT TROPOININ, ED - Abnormal; Notable for the following:    Troponin i, poc 0.82 (*)    All other components within normal limits  BASIC METABOLIC PANEL    Imaging Review No results found. I have personally reviewed and evaluated these  lab results as part of my medical decision-making.  EKG Date/Time:  Saturday October 05 2015 12:58:58 EST Ventricular Rate:  81 PR Interval:    QRS Duration: 86 QT Interval:  388 QTC Calculation: 450 R Axis:   127 Text Interpretation:  Critical Test Result: STEMI lateral ST elevation  w/ reciprocal depression in inferior leads  LATERAL STEMI Confirmed by  Gray Doering MD, Casen Pryor [phone]) on 10/05/2015 1:15:08 PM Medications  nitroGLYCERIN (NITROGLYN) 2 % ointment 1 inch (1 inch Topical Given 10/05/15 1310)  heparin bolus via infusion 4,000 Units (not administered)  fentaNYL (SUBLIMAZE) injection 100 mcg (not administered)  aspirin chewable tablet 243 mg (243 mg Oral Given 10/05/15 1310)    MDM   Final diagnoses:  Acute ST elevation myocardial infarction (STEMI) of lateral wall ([place])   59 year old male who presents with chest pain that began 1 hour prior to arrival in the setting of intermittent episodes of chest pain for the past few days. On arrival to triage, the  patient received EKG which showed ST elevation in lateral leads with reciprocal depression in inferior leads, consistent with STEMI. Patient was immediately brought back to room and STEMI alert initiated. Gave the patient aspirin, NTG, fentanyl and obtained labs and portable CXR. Per Dr. Martinique, STEMI cardiologist, gave pt 4000u bolus heparin.   Initial troponin 0.82. Patient taken to cardiology Cath Lab for intervention.  Sharlett Iles, MD 10/05/15 1350

## 2015-10-05 NOTE — ED Notes (Signed)
Zoll pads applied. 2 IVs started.

## 2015-10-05 NOTE — ED Notes (Signed)
Pt reports left sided chest pain intermittent for the past couple of days, but became severe about an hour a ago. Reports vomiting and SOB, pt is unable to sit still at triage.

## 2015-10-05 NOTE — Significant Event (Signed)
Rapid Response Event Note  Overview:  Called to see patient as code STEMI at 1316.    Initial Focused Assessment: Assisted with monitoring and transport to Cardiac Cath Lab.  Interventions:   Event Summary:   at      at          Baron Hamper

## 2015-10-05 NOTE — Progress Notes (Signed)
Dr. Lovena Le notified of EKG reading STEMI post procedure as well as repeat EKG that continues to read STEMI.  Patient still having chest pain.  Orders for Morphine received.  Will continue to monitor patient closely.

## 2015-10-05 NOTE — Progress Notes (Signed)
EKG CRITICAL VALUE     12 lead EKG performed.  Critical value noted.  Elfredia Nevins, RN notified.   Radene Gunning, CCT 10/05/2015 3:37 PM

## 2015-10-05 NOTE — H&P (Signed)
Physician History and Physical    Patient ID: Joseph Riddle MRN: [id number] DOB/AGE: 61-Dec-1957 61 y.o. Admit date: 10/05/2015  Primary Care Physician: Joseph Lennox, MD Primary Cardiologist Dr. 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 He was at [facility] when he experienced severe sharp pain in his left precordium. This radiated to his left arm and was associated with SOB and nausea with vomiting. Drove himself to the ED where Ecg showed marked ST elevation in the anterolateral leads. He was given IV heparin and oral ASA and Code STEMI was called. No prior history of CAD, HTN, hypercholesterolemia, or DM. Former smoker- quit 10 yrs ago. Father had MI at age 39 and is now 75 yo. Patient reports he has been having chest pain off and on for the past 2 days that he thought was indigestion. He was seen in July 2015 with atypical chest pain and ETT was negative at that time but with some chest pain.  Review of systems complete and found to be negative unless listed above  Past Medical History  Diagnosis Date  . Arthritis     Family History  Problem Relation Age of Onset  . Heart disease Maternal Grandfather   . Rheumatic fever Mother   . Heart disease Father     Social History   Social History  . Marital Status: Married    Spouse Name: N/A  . Number of Children: N/A  . Years of Education: N/A   Occupational History  . Not on file.   Social History Main Topics  . Smoking status: Former Smoker -- 15 years    Types: Cigarettes    Quit date: 07/24/2000  . Smokeless tobacco: Never Used  . Alcohol Use: No     Comment: very occasionally - mixed drinks  . Drug Use: No  . Sexual Activity: Not on file   Other Topics Concern  . Not on file   Social History Narrative   Married. Education: [employer]. Pt does not exercise.    Past Surgical History  Procedure Laterality Date  . Meniscus repair    . Foot surgery Left 10/2013    Dr. Harriet Riddle      Prescriptions prior to admission  Medication Sig Dispense Refill Last Dose  . diclofenac (VOLTAREN) 75 MG EC tablet TAKE ONE TABLET BY MOUTH TWICE DAILY AFTER MEALS 60 tablet 11 10/05/2015 at Unknown time  . doxycycline (DORYX) 100 MG EC tablet Take 50 mg by mouth 2 (two) times daily as needed.   10/04/2015 at Unknown time  . fish oil-omega-3 fatty acids 1000 MG capsule Take 2 g by mouth daily.   Past Week at Unknown time  . gabapentin (NEURONTIN) 300 MG capsule TAKE 1 CAPSULE AT MIDDAY AND 3 CAPSULES AT BEDTIME 120 capsule 11 10/04/2015 at Unknown time  . Multiple Vitamin (MULTIVITAMIN) tablet Take 1 tablet by mouth daily.   10/04/2015 at Unknown time  . zolpidem (AMBIEN) 10 MG tablet Take 1 tablet (10 mg total) by mouth at bedtime as needed. 30 tablet 5 10/04/2015 at Unknown time  . traMADol (ULTRAM) 50 MG tablet Take 1 tablet (50 mg total) by mouth every 8 (eight) hours as needed. (Patient not taking: Reported on 10/05/2015) 90 tablet 5 Not Taking at Unknown time    Physical Exam: Blood pressure 151/119, pulse 75, temperature 97.4 F (36.3 C), temperature source Oral, resp. rate 12, SpO2 96 %. Current Weight  05/09/15 72.213 kg (159 lb 3.2 oz)  11/08/14 70.67 kg (155 lb 12.8 oz)  06/14/14 71.578 kg (157 lb 12.8 oz)    GENERAL:  ill appearing WM in moderate distress HEENT:  PERRL, EOMI, sclera are clear. Oropharynx is clear. NECK:  No jugular venous distention, carotid upstroke brisk and symmetric, no bruits, no thyromegaly or adenopathy LUNGS:  Clear to auscultation bilaterally CHEST:  Unremarkable HEART:  RRR,  PMI not displaced or sustained,S1 and S2 within normal limits, no S3, no S4: no clicks, no rubs, no murmurs ABD:  Soft, nontender. BS +, no masses or bruits. No hepatomegaly, no splenomegaly EXT:  2 + pulses throughout, no edema, no cyanosis no clubbing. Pain in right knee to palpation/ movement. SKIN:  Warm and dry.  No rashes NEURO:  Alert and oriented x 3. Cranial nerves  II through XII intact. PSYCH:  Cognitively intact    Labs:   Lab Results  Component Value Date   WBC 12.9* 10/05/2015   HGB 18.0* 10/05/2015   HCT 50.5 10/05/2015   MCV 92.3 10/05/2015   PLT 369 10/05/2015    Recent Labs Lab 10/05/15 1300  NA 141  K 3.6  CL 102  CO2 26  BUN 14  CREATININE 1.12  CALCIUM 9.6  GLUCOSE 142*   No results found for: CKMB, CKMBINDEX, TROPONINI Lab Results  Component Value Date   CHOL 153 03/10/2012   Lab Results  Component Value Date   HDL 48 03/10/2012   Lab Results  Component Value Date   LDLCALC 92 03/10/2012   Lab Results  Component Value Date   TRIG 66 03/10/2012   Lab Results  Component Value Date   CHOLHDL 3.2 03/10/2012   No results found for: LDLDIRECT  No results found for: PROBNP No results found for: TSH No results found for: HGBA1C  Radiology: No results found.  EKG: NSR with 4 mm ST elevation in leads 1 and avL, 2 mm ST elevation in V2-4. Reciprocal ST depression in the inferior leads. I have personally reviewed and interpreted this study.   ASSESSMENT AND PLAN:  1. Anterior STEMI- acute. Will proceed with emergent cardiac cath/ PCI. Statin. Beta blocker as tolerated. 2. Family history of CAD 3. Osteoarthritis.   Signed: [place] Dani Riddle, [place]  10/05/2015, 2:51 PM

## 2015-10-05 NOTE — ED Notes (Signed)
Patient undressed, in gown, on monitor, continuous pulse oximetry, blood pressure cuff, oxygen [place] (2L) and zoll pads; myself, Lanny Hurst, EMT, Ria Comment, RN, Jerene Pitch, RN, [place], RN and Little, MD present in room

## 2015-10-06 DIAGNOSIS — I209 Angina pectoris, unspecified: Secondary | ICD-10-CM | POA: Insufficient documentation

## 2015-10-06 DIAGNOSIS — E785 Hyperlipidemia, unspecified: Secondary | ICD-10-CM

## 2015-10-06 LAB — BASIC METABOLIC PANEL
Anion gap: 11 (ref 5–15)
Anion gap: 9 (ref 5–15)
BUN: 15 mg/dL (ref 6–20)
BUN: 14 mg/dL (ref 6–20)
CHLORIDE: 103 mmol/L (ref 101–111)
CO2: 24 mmol/L (ref 22–32)
CO2: 26 mmol/L (ref 22–32)
CREATININE: 0.9 mg/dL (ref 0.61–1.24)
Calcium: 9 mg/dL (ref 8.9–10.3)
Calcium: 9 mg/dL (ref 8.9–10.3)
Chloride: 103 mmol/L (ref 101–111)
Creatinine, Ser: 0.93 mg/dL (ref 0.61–1.24)
GFR calc Af Amer: >60 mL/min (ref >60)
GFR calc non Af Amer: >60 mL/min (ref >60)
GLUCOSE: 178 mg/dL — AB (ref 65–99)
Glucose, Bld: 150 mg/dL — ABNORMAL HIGH (ref 65–99)
POTASSIUM: 5.3 mmol/L — AB (ref 3.5–5.1)
POTASSIUM: 3.8 mmol/L (ref 3.5–5.1)
SODIUM: 138 mmol/L (ref 135–145)
Sodium: 138 mmol/L (ref 135–145)

## 2015-10-06 LAB — CBC WITH DIFFERENTIAL/PLATELET
Basophils Absolute: 0 10*3/uL (ref 0.0–0.1)
Basophils Relative: 0 %
EOS PCT: 0 %
Eosinophils Absolute: 0 10*3/uL (ref 0.0–0.7)
HCT: 48.3 % (ref 39.0–52.0)
Hemoglobin: 16.8 g/dL (ref 13.0–17.0)
LYMPHS ABS: 0.9 10*3/uL (ref 0.7–4.0)
LYMPHS PCT: 4 %
MCH: 32.6 pg (ref 26.0–34.0)
MCHC: 34.8 g/dL (ref 30.0–36.0)
MCV: 93.8 fL (ref 78.0–100.0)
MONO ABS: 1.9 10*3/uL — AB (ref 0.1–1.0)
MONOS PCT: 9 %
Neutro Abs: 18.3 10*3/uL — ABNORMAL HIGH (ref 1.7–7.7)
Neutrophils Relative %: 87 %
PLATELETS: 323 10*3/uL (ref 150–400)
RBC: 5.15 MIL/uL (ref 4.22–5.81)
RDW: 12.4 % (ref 11.5–15.5)
WBC: 21.1 10*3/uL — ABNORMAL HIGH (ref 4.0–10.5)

## 2015-10-06 LAB — CBC
HEMATOCRIT: 49.5 % (ref 39.0–52.0)
Hemoglobin: 17.3 g/dL — ABNORMAL HIGH (ref 13.0–17.0)
MCH: 32.5 pg (ref 26.0–34.0)
MCHC: 34.9 g/dL (ref 30.0–36.0)
MCV: 93 fL (ref 78.0–100.0)
PLATELETS: 341 10*3/uL (ref 150–400)
RBC: 5.32 MIL/uL (ref 4.22–5.81)
RDW: 12.6 % (ref 11.5–15.5)
WBC: 20.2 10*3/uL — AB (ref 4.0–10.5)

## 2015-10-06 LAB — TROPONIN I

## 2015-10-06 LAB — LIPID PANEL
CHOL/HDL RATIO: 3.6 ratio
Cholesterol: 126 mg/dL (ref 0–200)
HDL: 35 mg/dL — AB (ref >40)
LDL Cholesterol: 81 mg/dL (ref 0–99)
TRIGLYCERIDES: 52 mg/dL (ref <150)
VLDL: 10 mg/dL (ref 0–40)

## 2015-10-06 MED ORDER — ISOSORBIDE MONONITRATE ER 30 MG PO TB24
15.0000 mg | ORAL_TABLET | Freq: Every day | ORAL | Status: DC
  Administered 2015-10-06: 15 mg via ORAL
  Filled 2015-10-06: qty 1

## 2015-10-06 MED ORDER — FUROSEMIDE 10 MG/ML IJ SOLN
20.0000 mg | Freq: Once | INTRAMUSCULAR | Status: AC
  Administered 2015-10-06: 20 mg via INTRAVENOUS
  Filled 2015-10-06: qty 2

## 2015-10-06 MED ORDER — ALUM & MAG HYDROXIDE-SIMETH 200-200-20 MG/5ML PO SUSP
15.0000 mL | ORAL | Status: DC | PRN
  Administered 2015-10-06: 15 mL via ORAL
  Filled 2015-10-06: qty 30

## 2015-10-06 NOTE — Progress Notes (Signed)
Dr. Oval Linsey notified of low Bp taken manually. Patient is asymptomatic. New orders received. Will continue to monitor closely and notify MD as needed.

## 2015-10-06 NOTE — Progress Notes (Signed)
    Subjective:  This AM, he was sitting in a chair. He reported some chest pain [1/10] that was much better than what he felt on admission. He denied any dyspnea.  Objective:  Vital Signs in the last 24 hours: Temp:  [97.4 F (36.3 C)-98.4 F (36.9 C)] 97.8 F (36.6 C) (11/13 0400) Pulse Rate:  [0-141] 84 (11/13 0700) Resp:  [0-33] 16 (11/13 0700) BP: (95-195)/(69-126) 105/88 mmHg (11/13 0700) SpO2:  [0 %-100 %] 96 % (11/13 0700) Weight:  [151 lb 3.8 oz (68.6 kg)] 151 lb 3.8 oz (68.6 kg) (11/12 1500)  Intake/Output from previous day: 11/12 0701 - 11/13 0700 In: 435.5 [P.O.:200; I.V.:235.5] Out: 375 [Urine:375]  Physical Exam: General: Caucasian male, sitting in chair, NAD, 2LO2 by nasal cannula HEENT: PERRL, EOMI, no scleral icterus, oropharynx clear, no JVD Cardiac: RRR, no rubs, murmurs or gallops Pulm: clear to auscultation bilaterally, no wheezes, rales, or rhonchi Abd: soft, nontender, nondistended, BS present Ext: warm and well perfused, no pedal or tibial edema, right wrist with bandage clean, dry, intact and no hematoma Neuro: responds to questions appropriately; moving all extremities freely    Lab Results:  Recent Labs  10/05/15 1815 10/06/15 0320  WBC 17.4* 20.2*  HGB 15.8 17.3*  PLT 276 341    Recent Labs  10/05/15 1815 10/06/15 0320  NA 142 138  K 3.5 5.3*  CL 110 103  CO2 22 24  GLUCOSE 136* 150*  BUN 12 15  CREATININE 0.83 0.90    Recent Labs  10/05/15 2128 10/06/15 0320  TROPONINI >65.00* >65.00*    Cardiac Studies: Procedures    Coronary Stent Intervention   Left Heart Cath    Conclusion     Prox RCA to Mid RCA lesion, 10% stenosed.  There is moderate left ventricular systolic dysfunction.  Prox LAD lesion, 100% stenosed. Post intervention, there is a 0% residual stenosis.  Ost 1st Diag lesion, 90% stenosed.  1. Single vessel occlusive CAD 2. Moderate LV dysfunction 3. Successful stenting of the proximal LAD with  a DES. There is residual 90% stenosis at the origin of the first diagonal but there is TIMI 3 flow.     Tele: Occasional PVCs  Assessment/Plan:  Joseph Riddle is a 59 year old male with no known past medical history who presented with anterior STEMI now s/p DES to proximal LAD and found to have ischemic cardiomyopathy.  Anterior STEMI s/p DES to proximal LAD: EKG on admission notable for ST elevations in I, avL, V2-4 with reciprocal depression in the interior leads. Most recent EKG overnight with Q waves in I, aVL and improving ST changes in V2-6. TSH reassuring. -Continue ASA & Brillinta -Continue Coreg 3.125mg  twice daily -Continue Lipitor 80mg  -A1c pending -Wean O2 as tolerated  Ischemic cardiomyopathy: EF 35-45% yesterday on ventriculogram with wall motion abnormalities in the LV. -Echo pending this AM -Defer ACEi for now given BP 110s-120s/70s-80s  Hyperkalemia: K 5.3 this AM though likely a result of hemolysis.  -Recheck BMET   Joseph Riddle, M.D. 10/06/2015, 7:49 AM

## 2015-10-06 NOTE — Progress Notes (Signed)
Carvedilol discontinued due to hypotension.  BP 80/65.  Patient asymptomatic.  Syesha Thaw C. Oval Linsey, MD 10/06/2015 11:02 PM

## 2015-10-07 ENCOUNTER — Encounter (HOSPITAL_COMMUNITY): Payer: Self-pay | Admitting: Cardiology

## 2015-10-07 ENCOUNTER — Inpatient Hospital Stay (HOSPITAL_COMMUNITY): Payer: BLUE CROSS/BLUE SHIELD

## 2015-10-07 DIAGNOSIS — I2510 Atherosclerotic heart disease of native coronary artery without angina pectoris: Secondary | ICD-10-CM

## 2015-10-07 DIAGNOSIS — Z9861 Coronary angioplasty status: Secondary | ICD-10-CM

## 2015-10-07 DIAGNOSIS — Z955 Presence of coronary angioplasty implant and graft: Secondary | ICD-10-CM

## 2015-10-07 LAB — HEMOGLOBIN A1C
Hgb A1c MFr Bld: 5.2 % (ref 4.8–5.6)
Mean Plasma Glucose: 103 mg/dL

## 2015-10-07 LAB — BASIC METABOLIC PANEL
Anion gap: 7 (ref 5–15)
BUN: 17 mg/dL (ref 6–20)
CHLORIDE: 102 mmol/L (ref 101–111)
CO2: 29 mmol/L (ref 22–32)
CREATININE: 0.97 mg/dL (ref 0.61–1.24)
Calcium: 8.6 mg/dL — ABNORMAL LOW (ref 8.9–10.3)
Glucose, Bld: 110 mg/dL — ABNORMAL HIGH (ref 65–99)
POTASSIUM: 3.4 mmol/L — AB (ref 3.5–5.1)
SODIUM: 138 mmol/L (ref 135–145)

## 2015-10-07 LAB — CBC
HEMATOCRIT: 42.5 % (ref 39.0–52.0)
Hemoglobin: 14.6 g/dL (ref 13.0–17.0)
MCH: 31.9 pg (ref 26.0–34.0)
MCHC: 34.4 g/dL (ref 30.0–36.0)
MCV: 93 fL (ref 78.0–100.0)
PLATELETS: 232 10*3/uL (ref 150–400)
RBC: 4.57 MIL/uL (ref 4.22–5.81)
RDW: 12.3 % (ref 11.5–15.5)
WBC: 11.8 10*3/uL — AB (ref 4.0–10.5)

## 2015-10-07 LAB — BRAIN NATRIURETIC PEPTIDE: B Natriuretic Peptide: 338.9 pg/mL — ABNORMAL HIGH (ref 0.0–100.0)

## 2015-10-07 LAB — POCT ACTIVATED CLOTTING TIME: Activated Clotting Time: 743 seconds

## 2015-10-07 MED ORDER — HEART ATTACK BOUNCING BOOK
Freq: Once | Status: AC
Start: 2015-10-07 — End: 2015-10-07
  Administered 2015-10-07: 06:00:00
  Filled 2015-10-07: qty 1

## 2015-10-07 MED ORDER — POTASSIUM CHLORIDE CRYS ER 20 MEQ PO TBCR
40.0000 meq | EXTENDED_RELEASE_TABLET | Freq: Once | ORAL | Status: AC
  Administered 2015-10-07: 40 meq via ORAL
  Filled 2015-10-07: qty 2

## 2015-10-07 MED ORDER — PERFLUTREN LIPID MICROSPHERE
1.0000 mL | INTRAVENOUS | Status: AC | PRN
Start: 2015-10-07 — End: 2015-10-07
  Administered 2015-10-07: 3 mL via INTRAVENOUS
  Filled 2015-10-07: qty 10

## 2015-10-07 MED FILL — Verapamil HCl IV Soln 2.5 MG/ML: INTRAVENOUS | Qty: 2 | Status: AC

## 2015-10-07 NOTE — Care Management (Signed)
EKG CRITICAL VALUE     12 lead EKG performed.  Critical value noted. Elon Jester, RN notified.   Annemarie Sebree L, CCT 10/07/2015 7:22 AM

## 2015-10-07 NOTE — Progress Notes (Signed)
    Subjective:  This AM, he appears better than yesterday. No chest discomfort, shortness of breath, indigestion, gas.  Objective:  Vital Signs in the last 24 hours: Temp:  [97.5 F (36.4 C)-98.6 F (37 C)] 98.6 F (37 C) (11/14 0500) Pulse Rate:  [75-142] 86 (11/13 1500) Resp:  [12-29] 16 (11/14 0500) BP: (72-110)/(50-81) 78/61 mmHg (11/14 0630) SpO2:  [95 %-98 %] 96 % (11/14 0500)  Intake/Output from previous day: 11/13 0701 - 11/14 0700 In: 30 [P.O.:960] Out: 750 [Urine:750]  Physical Exam: General: Caucasian male, sitting in chair, NAD HEENT: PERRL, EOMI, no scleral icterus, oropharynx clear, no JVD Cardiac: RRR, no rubs, murmurs or gallops Pulm: clear to auscultation bilaterally, no wheezes, rales, or rhonchi Abd: soft, nontender, nondistended, BS present Ext: warm and well perfused, no pedal or tibial edema, right wrist with bandage clean, dry, intact and no hematoma Neuro: responds to questions appropriately; moving all extremities freely    Lab Results:  Recent Labs  10/06/15 0806 10/07/15 0311  WBC 21.1* 11.8*  HGB 16.8 14.6  PLT 323 232    Recent Labs  10/06/15 0806 10/07/15 0311  NA 138 138  K 3.8 3.4*  CL 103 102  CO2 26 29  GLUCOSE 178* 110*  BUN 14 17  CREATININE 0.93 0.97    Recent Labs  10/05/15 2128 10/06/15 0320  TROPONINI >65.00* >65.00*    Cardiac Studies: Procedures    Coronary Stent Intervention   Left Heart Cath    Conclusion     Prox RCA to Mid RCA lesion, 10% stenosed.  There is moderate left ventricular systolic dysfunction.  Prox LAD lesion, 100% stenosed. Post intervention, there is a 0% residual stenosis.  Ost 1st Diag lesion, 90% stenosed.  1. Single vessel occlusive CAD 2. Moderate LV dysfunction 3. Successful stenting of the proximal LAD with a DES. There is residual 90% stenosis at the origin of the first diagonal but there is TIMI 3 flow.    Tele: Occasional PVCs, stable from  11/13  Assessment/Plan:  Mr. Buecker is a 59 year old male with no known past medical history who presented with anterior STEMI now s/p DES to proximal LAD and found to have ischemic cardiomyopathy.  Anterior STEMI s/p DES to proximal LAD: EKG on admission notable for ST elevations in I, avL, V2-4 with reciprocal depression in the interior leads. EKG this AM with Q waves in I, aVL and improving ST changes in V2-6. TSH reassuring yesterday; Coreg held overnight for low blood pressure. -Hold Imdur and Coreg until BP recovers -Continue ASA & Brillinta -Continue Lipitor 80mg  -A1c pending -Consult cardiac rehab  Ischemic cardiomyopathy: EF 35-45% yesterday on ventriculogram with wall motion abnormalities in the LV. -Echo pending this AM -Defer ACEi for now given BP 70s-90s/50-60s overnight.  Hypokalemia: K 3.4 this AM, likely from receiving Lasix yesterday.  -Gave Kdur 47mEq    Charlott Rakes, M.D. 10/07/2015, 7:46 AM

## 2015-10-07 NOTE — Care Management Note (Signed)
Case Management Note  Patient Details  Name: HANCEL GEPHART MRN: [id number] Date of Birth: May 04, 1956  Subjective/Objective:      Adm w mi             Action/Plan: lives at home w fam, pcp dr b mcpherson  Expected Discharge Date:                  Expected Discharge Plan:  Home/Self Care  In-House Referral:     Discharge planning Services  CM Consult, Medication Assistance  Post Acute Care Choice:    Choice offered to:     DME Arranged:    DME Agency:     HH Arranged:    HH Agency:     Status of Service:     Medicare Important Message Given:    Date Medicare IM Given:    Medicare IM give by:    Date Additional Medicare IM Given:    Additional Medicare Important Message give by:     If discussed at [place] of Stay Meetings, dates discussed:    Additional Comments: ur review done. Gave pt 30day free and copay assist card for brilinta. Has bcbs ins.  Lacretia Leigh, RN 10/07/2015, 9:04 AM

## 2015-10-07 NOTE — Progress Notes (Signed)
CARDIAC REHAB PHASE I   PRE:  Rate/Rhythm: 79 SR  BP:  Sitting: 106/81        SaO2: 97 RA  MODE:  Ambulation: 700 ft   POST:  Rate/Rhythm: 88 SR  BP:  Sitting: 101/73         SaO2: 98 RA  Pt ambulated 700 ft on RA, independent, fairly steady gait, tolerated well.  Pt c/o of chronic foot and knee pain, denies cp, dizziness, DOE, declined rest stop. Completed MI/stent education.  Reviewed risk factors, anti-platelet therapy, stent card, activity restrictions, ntg, exercise, heart healthy diet, portion control, CHF booklet and zone tool, s/s heart failure, daily weights, sodium and fluid restrictions and phase 2 cardiac rehab. Pt verbalized understanding. Pt agrees to phase 2 cardiac rehab referral, will send to [facility]. Pt declined to have family present for education. Pt states "I know I need to change my diet" but when asked if he is ready to do so, pt replies "no." Pt with very flat affect, states he does not think it is "worth it to stay another day in this place." Pt to recliner after walk, call bell within reach. Will follow-up tomorrow.   [id number]  Lenna Sciara, RN, BSN 10/07/2015 9:46 AM

## 2015-10-08 DIAGNOSIS — I255 Ischemic cardiomyopathy: Secondary | ICD-10-CM | POA: Diagnosis present

## 2015-10-08 DIAGNOSIS — I959 Hypotension, unspecified: Secondary | ICD-10-CM | POA: Diagnosis not present

## 2015-10-08 LAB — BASIC METABOLIC PANEL
ANION GAP: 8 (ref 5–15)
BUN: 18 mg/dL (ref 6–20)
CALCIUM: 8.7 mg/dL — AB (ref 8.9–10.3)
CO2: 27 mmol/L (ref 22–32)
CREATININE: 1.03 mg/dL (ref 0.61–1.24)
Chloride: 103 mmol/L (ref 101–111)
GFR calc non Af Amer: >60 mL/min (ref >60)
Glucose, Bld: 109 mg/dL — ABNORMAL HIGH (ref 65–99)
Potassium: 4 mmol/L (ref 3.5–5.1)
SODIUM: 138 mmol/L (ref 135–145)

## 2015-10-08 LAB — CBC
HCT: 42.2 % (ref 39.0–52.0)
HEMOGLOBIN: 15.1 g/dL (ref 13.0–17.0)
MCH: 33 pg (ref 26.0–34.0)
MCHC: 35.8 g/dL (ref 30.0–36.0)
MCV: 92.1 fL (ref 78.0–100.0)
PLATELETS: 238 10*3/uL (ref 150–400)
RBC: 4.58 MIL/uL (ref 4.22–5.81)
RDW: 12.1 % (ref 11.5–15.5)
WBC: 11 10*3/uL — AB (ref 4.0–10.5)

## 2015-10-08 MED ORDER — ATORVASTATIN CALCIUM 80 MG PO TABS: 80.0000 mg | ORAL_TABLET | Freq: Every day | ORAL | Status: DC

## 2015-10-08 MED ORDER — NITROGLYCERIN 0.4 MG SL SUBL: 0.4000 mg | SUBLINGUAL_TABLET | SUBLINGUAL | Status: DC | PRN

## 2015-10-08 MED ORDER — ZOLPIDEM TARTRATE 10 MG PO TABS: 5.0000 mg | ORAL_TABLET | Freq: Every evening | ORAL | Status: DC | PRN

## 2015-10-08 MED ORDER — ACETAMINOPHEN 325 MG PO TABS: 650.0000 mg | ORAL_TABLET | ORAL | Status: DC | PRN

## 2015-10-08 MED ORDER — PANTOPRAZOLE SODIUM 40 MG PO TBEC: 40.0000 mg | DELAYED_RELEASE_TABLET | Freq: Every day | ORAL | Status: DC

## 2015-10-08 MED ORDER — ASPIRIN 81 MG PO CHEW: 81.0000 mg | CHEWABLE_TABLET | Freq: Every day | ORAL | Status: DC

## 2015-10-08 MED ORDER — TICAGRELOR 90 MG PO TABS: 90.0000 mg | ORAL_TABLET | Freq: Two times a day (BID) | ORAL | Status: DC

## 2015-10-08 NOTE — Discharge Instructions (Signed)
Heart Attack A heart attack (myocardial infarction, MI) causes damage to your heart that cannot be fixed. A heart attack can happen when a heart (coronary) artery becomes blocked or narrowed. This cuts off the blood supply and oxygen to your heart. When one or more of your coronary arteries becomes blocked, that area of your heart begins to die. This causes the pain you feel during a heart attack. Heart attack pain can also occur in one part of the body but be felt in another part of the body (referred pain). You may feel referred heart attack pain in your left arm, neck, or jaw. Pain may even be felt in the right arm. CAUSES  Many conditions can cause a heart attack. These include:   Atherosclerosis. This is when a fatty substance (plaque) gradually builds up in the arteries. This buildup can block or reduce the blood supply to one or more of the heart arteries.  A blood clot. A blood clot can develop suddenly when plaque breaks up (ruptures) within a heart artery. A blood clot can block the heart artery, which prevents blood flow to the heart.   Severe tightening (spasm) of the heart artery. This cuts off blood flow through the artery.  RISK FACTORS People at risk for heart attack usually have one or more of the following risk factors:   High blood pressure (hypertension).  High cholesterol.  Smoking.  Being male.  Being overweight or obese.  Older aged.   A family history of heart disease.  Lack of exercise.  Diabetes.  Stress.  Drinking too much alcohol.  Using illegal street drugs, such as cocaine and methamphetamines. SYMPTOMS  Heart attack symptoms can vary from person to person. Symptoms depend on factors like gender and age.   In both men and women, heart attack symptoms can include the following:   Chest pain. This may feel like crushing, squeezing, or a feeling of pressure.  Shortness of breath.  Heartburn or indigestion with or without vomiting,  shortness of breath, or sweating.  Sudden cold sweats.  Sudden light-headedness.  Upper back pain.   Women can have unique heart attack symptoms, such as:   Unexplained feelings of nervousness or anxiety.  Discomfort between the shoulder blades or upper back.  Tingling in the hands and arms.   Older people (of both genders) can have subtle heart attack symptoms, such as:   Sweating.  Shortness of breath.  General tiredness or not feeling well.  DIAGNOSIS  Diagnosing a heart attack involves several tests. They include:   An assessment of your vital signs. This includes checking your:  Heart rhythm.  Blood pressure.  Breathing rate.  Oxygen level.   An ECG (electrocardiogram) to measure the electrical activity of your heart.  Blood tests called cardiac markers. In these tests, blood is drawn at scheduled times to check for the specific proteins or enzymes released by damaged heart muscle.  A chest X-ray.  An echocardiogram to evaluate heart motion and blood flow.  Coronary angiography to look at the heart arteries.  TREATMENT  Treatment for a heart attack may include:   Medicine that breaks up or dissolves blood clots in the heart artery.  Angioplasty.  Cardiac stent placement.  Intra-aortic balloon pump therapy (IABP).  Open heart surgery (coronary artery bypass graft, CABG). HOME CARE INSTRUCTIONS  Take medicines only as directed by your health care provider. You may need to take medicine after a heart attack to:   Keep your blood from  clotting too easily.  Control your blood pressure.  Lower your cholesterol.  Control abnormal heart rhythms.   Do not take the following medicines unless your health care provider approves:  Nonsteroidal anti-inflammatory drugs (NSAIDs), such as ibuprofen, naproxen, or celecoxib.  Vitamin supplements that contain vitamin A, vitamin E, or both.  Hormone replacement therapy that contains estrogen with or  without progestin.  Make lifestyle changes as directed by your health care provider. These may include:   Quitting smoking, if you smoke.  Getting regular exercise. Ask your health care provider to suggest some activities that are safe for you.  Eating a heart-healthy diet. A registered dietitian can help you learn healthy eating options.  Maintaining a healthy weight.   Managing diabetes, if necessary.  Reducing stress.  Limiting how much alcohol you drink. SEEK IMMEDIATE MEDICAL CARE IF:   You have sudden, unexplained chest discomfort.  You have sudden, unexplained discomfort in your arms, back, neck, or jaw.  You have shortness of breath at any time.  You suddenly start to sweat or your skin gets clammy.  You feel nauseous or vomit.  You suddenly feel light-headed or dizzy.  Your heart begins to beat fast or feels like it is skipping beats. These symptoms may represent a serious problem that is an emergency. Do not wait to see if the symptoms will go away. Get medical help right away. Call your local emergency services (911 in the [place] Do not drive yourself to the hospital.   This information is not intended to replace advice given to you by your health care provider. Make sure you discuss any questions you have with your health care provider.   Document Released: 11/09/2005 Document Revised: 11/30/2014 Document Reviewed: 01/12/2014 Elsevier Interactive Patient Education 2016 [employer] Coronary Angiogram With Stent, Care After Refer to this sheet in the next few weeks. These instructions provide you with information about caring for yourself after your procedure. Your health care provider may also give you more specific instructions. Your treatment has been planned according to current medical practices, but problems sometimes occur. Call your health care provider if you have any problems or questions after your procedure. WHAT TO EXPECT AFTER THE PROCEDURE  After  your procedure, it is typical to have the following:  Bruising at the catheter insertion site that usually fades within 1-2 weeks.  Blood collecting in the tissue (hematoma) that may be painful to the touch. It should usually decrease in size and tenderness within 1-2 weeks. HOME CARE INSTRUCTIONS  Take medicines only as directed by your health care provider. Blood thinners may be prescribed after your procedure to improve blood flow through the stent.  You may shower 24-48 hours after the procedure or as directed by your health care provider. Remove the bandage (dressing) and gently wash the catheter insertion site with plain soap and water. Pat the area dry with a clean towel. Do not rub the site, because this may cause bleeding.  Do not take baths, swim, or use a hot tub until your health care provider approves.  Check your catheter insertion site every day for redness, swelling, or drainage.  Do not apply powder or lotion to the site.  Do not lift over 10 lb (4.5 kg) for 5 days after your procedure or as directed by your health care provider.  Ask your health care provider when it is okay to:  Return to work or school.  Resume usual physical activities or sports.  Resume sexual activity.  Eat a heart-healthy diet. This should include plenty of fresh fruits and vegetables. Meat should be lean cuts. Avoid the following types of food:  Food that is high in salt.  Canned or highly processed food.  Food that is high in saturated fat or sugar.  Fried food.  Make any other lifestyle changes as recommended by your health care provider. These may include:  Not using any tobacco products, including cigarettes, chewing tobacco, or electronic cigarettes.If you need help quitting, ask your health care provider.  Managing your weight.  Getting regular exercise.  Managing your blood pressure.  Limiting your alcohol intake.  Managing other health problems, such as  diabetes.  If you need an MRI after your heart stent has been placed, be sure to tell the health care provider who orders the MRI that you have a heart stent.  Keep all follow-up visits as directed by your health care provider. This is important. SEEK MEDICAL CARE IF:  You have a fever.  You have chills.  You have increased bleeding from the catheter insertion site. Hold pressure on the site. SEEK IMMEDIATE MEDICAL CARE IF:  You develop chest pain or shortness of breath, feel faint, or pass out.  You have unusual pain at the catheter insertion site.  You have redness, warmth, or swelling at the catheter insertion site.  You have drainage (other than a small amount of blood on the dressing) from the catheter insertion site.  The catheter insertion site is bleeding, and the bleeding does not stop after 30 minutes of holding steady pressure on the site.  You develop bleeding from any other place, such as from your rectum. There may be bright red blood in your urine or stool, or it may appear as black, tarry stool.   This information is not intended to replace advice given to you by your health care provider. Make sure you discuss any questions you have with your health care provider.   Document Released: 05/29/2005 Document Revised: 11/30/2014 Document Reviewed: 04/03/2013 Elsevier Interactive Patient Education [employer].

## 2015-10-08 NOTE — Progress Notes (Signed)
    Subjective:  No CP, no SOB.   Objective:  Vital Signs in the last 24 hours: Temp:  [98 F (36.7 C)-99 F (37.2 C)] 98.6 F (37 C) (11/15 0532) Pulse Rate:  [80-91] 80 (11/15 0600) Resp:  [17-18] 17 (11/15 0532) BP: (84-105)/(59-76) 90/62 mmHg (11/15 0600) SpO2:  [96 %-100 %] 96 % (11/15 0532)  Intake/Output from previous day: 11/14 0701 - 11/15 0700 In: 720 [P.O.:720] Out: -    Physical Exam: General: Well developed, well nourished, in no acute distress. Head:  Normocephalic and atraumatic. Lungs: Clear to auscultation and percussion. Heart: Normal S1 and S2.  No murmur, rubs or gallops.  Abdomen: soft, non-tender, positive bowel sounds. Extremities: No clubbing or cyanosis. No edema. Neurologic: Alert and oriented x 3. Flat affect    Lab Results:  Recent Labs  10/07/15 0311 10/08/15 0324  WBC 11.8* 11.0*  HGB 14.6 15.1  PLT 232 238    Recent Labs  10/07/15 0311 10/08/15 0324  NA 138 138  K 3.4* 4.0  CL 102 103  CO2 29 27  GLUCOSE 110* 109*  BUN 17 18  CREATININE 0.97 1.03    Recent Labs  10/05/15 2128 10/06/15 0320  TROPONINI >65.00* >65.00*   Hepatic Function Panel  Recent Labs  10/05/15 1815  PROT 5.3*  ALBUMIN 3.4*  AST 538*  ALT 96*  ALKPHOS 45  BILITOT 0.8    Recent Labs  10/06/15 0320  CHOL 126   No results for input(s): PROTIME in the last 72 hours.   Telemetry: No VT, NSR Personally viewed.   EKG:  ST persistent elevation ant lat, Q waves noted.  Cardiac Studies:  ECHO EF 30-35%  Assessment/Plan:  Principal Problem:   ST elevation (STEMI) myocardial infarction involving left anterior descending coronary artery (HCC) Active Problems:   ST elevation myocardial infarction (STEMI) involving left anterior descending (LAD) coronary artery with complication (HCC)   Ischemic chest pain (HCC)   Dyslipidemia   Stented coronary artery   -DC home -EF 30% (repeat ECHO in 45 days) -BP soft, hypotension. No room for  beta blocker or ace-I at this time. Hopefully will be able to start as outpatient.  -high dose/intensity statin -cardiac rehab (encourage) -ASA, Brilinta (understands importance) -Dr. Rema Jasmine, [place] 10/08/2015, 8:26 AM

## 2015-10-08 NOTE — Discharge Summary (Signed)
Patient ID: Joseph Riddle,  MRN: [id number], DOB/AGE: [birth year] 58 y.o.  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 He was at [facility] when he experienced severe sharp pain in his left precordium. This radiated to his left arm and was associated with SOB and nausea with vomiting. Drove himself to the ED where EKG showed marked ST elevation in the anterolateral leads. He was given IV heparin and oral ASA and Code STEMI was called. No prior history of CAD, HTN, hypercholesterolemia, or DM. Former smoker- quit 10 yrs ago. Father had MI at age 2 and is now 104 yo. Patient reports he has been having chest pain off and on for the past 2 days that he thought was indigestion. He was seen in July 2015 by Dr [name] with atypical chest pain and ETT was negative at that time but with some chest pain and elevated B/P. He was taken urgently to the cath lab by Dr Martinique. Cath revealed 100% proximal LAD. This was treated with a DES. The pt was noted to have residual 90% Dx1 and moderate LVD. Echo 11/14 revealed an EF of 30-35%.  He was transferred to telemetry and ambulated. His medications were adjusted but his B/P was low and this limited treatment. The pt was seen by Dr Marlou Porch the morning of the 15th and felt to be stable for discharge. The pt will be seen as a TOC pt in 7-10 days. At that time will try and and Coreg, and low dose ACE if his B/P is adequate. The pt also needs a repeat echo in 45  days, and lipids and LFTs in 90 days.   Discharge Vitals:  Blood pressure 90/62, pulse 80, temperature 98.6 F (37 C), temperature source Oral, resp. rate 17, height 5\' 9"  (1.753 m), weight 151 lb 3.8 oz (68.6 kg), SpO2 96 %.    Labs: Results for orders placed or performed during the hospital encounter of 10/05/15 (from the past 24 hour(s))  Basic metabolic panel     Status: Abnormal   Collection Time: 10/08/15  3:24 AM  Result Value Ref Range   Sodium 138 135 - 145 mmol/L   Potassium 4.0 3.5 - 5.1 mmol/L   Chloride 103 101 - 111 mmol/L   CO2 27 22 - 32 mmol/L   Glucose, Bld 109 (H) 65 - 99 mg/dL   BUN 18 6 - 20 mg/dL   Creatinine, Ser 1.03 0.61 - 1.24 mg/dL   Calcium 8.7 (L) 8.9 - 10.3 mg/dL   GFR calc non Af Amer >60 >60 mL/min   GFR calc Af Amer >60 >60 mL/min   Anion gap 8 5 - 15  CBC     Status: Abnormal   Collection Time: 10/08/15  3:24 AM  Result Value Ref Range   WBC 11.0 (H) 4.0 - 10.5 K/uL   RBC 4.58 4.22 - 5.81 MIL/uL   Hemoglobin 15.1 13.0 - 17.0 g/dL   HCT 42.2 39.0 - 52.0 %   MCV 92.1 78.0 - 100.0 fL  MCH 33.0 26.0 - 34.0 pg   MCHC 35.8 30.0 - 36.0 g/dL   RDW 12.1 11.5 - 15.5 %   Platelets 238 150 - 400 K/uL    Disposition:      Follow-up Information    Follow up with Pixie Casino, MD.   Specialty:  Cardiology   Why:  office will contact you to see Dr [facility] PA/NP in one week   Contact information:   [place] [address] [phone]       Discharge Medications:    Medication List    STOP taking these medications        traMADol 50 MG tablet  Commonly known as:  ULTRAM      TAKE these medications        acetaminophen 325 MG tablet  Commonly known as:  TYLENOL  Take 2 tablets (650 mg total) by mouth every 4 (four) hours as needed for headache or mild pain.     aspirin 81 MG chewable tablet  Chew 1 tablet (81 mg total) by mouth daily.     atorvastatin 80 MG tablet  Commonly known as:  LIPITOR  Take  1 tablet (80 mg total) by mouth daily at 6 PM.     diclofenac 75 MG EC tablet  Commonly known as:  VOLTAREN  TAKE ONE TABLET BY MOUTH TWICE DAILY AFTER MEALS     doxycycline 100 MG EC tablet  Commonly known as:  DORYX  Take 50 mg by mouth 2 (two) times daily as needed.     fish oil-omega-3 fatty acids 1000 MG capsule  Take 2 g by mouth daily.     gabapentin 300 MG capsule  Commonly known as:  NEURONTIN  TAKE 1 CAPSULE AT MIDDAY AND 3 CAPSULES AT BEDTIME     multivitamin tablet  Take 1 tablet by mouth daily.     nitroGLYCERIN 0.4 MG SL tablet  Commonly known as:  NITROSTAT  Place 1 tablet (0.4 mg total) under the tongue every 5 (five) minutes x 3 doses as needed for chest pain.     pantoprazole 40 MG tablet  Commonly known as:  PROTONIX  Take 1 tablet (40 mg total) by mouth daily.     ticagrelor 90 MG Tabs tablet  Commonly known as:  BRILINTA  Take 1 tablet (90 mg total) by mouth 2 (two) times daily.     zolpidem 10 MG tablet  Commonly known as:  AMBIEN  Take 0.5-1 tablets (5-10 mg total) by mouth at bedtime as needed for sleep.         Duration of Discharge Encounter: Greater than 30 minutes including physician time.  Signed, Kerin Ransom PA-C 10/08/2015 9:29 AM   DC home -EF 30% (repeat ECHO in 45 days) -BP soft, hypotension. No room for beta blocker or ace-I at this time. Hopefully will be able to start as outpatient.  -high dose/intensity statin -cardiac rehab (encourage) -ASA, Brilinta (understands importance) -Dr. Rema Jasmine, [place]

## 2015-10-11 ENCOUNTER — Telehealth: Payer: Self-pay | Admitting: *Deleted

## 2015-10-11 NOTE — Telephone Encounter (Signed)
Faxed orders for phase 2 cardiac rehab - no GXT required.  

## 2015-10-22 ENCOUNTER — Encounter: Payer: Self-pay | Admitting: Physician Assistant

## 2015-10-22 ENCOUNTER — Ambulatory Visit (INDEPENDENT_AMBULATORY_CARE_PROVIDER_SITE_OTHER): Payer: BLUE CROSS/BLUE SHIELD | Admitting: Physician Assistant

## 2015-10-22 VITALS — BP 98/74 | HR 67 | Ht 70.0 in | Wt 152.2 lb

## 2015-10-22 DIAGNOSIS — E785 Hyperlipidemia, unspecified: Secondary | ICD-10-CM | POA: Diagnosis not present

## 2015-10-22 DIAGNOSIS — Z79899 Other long term (current) drug therapy: Secondary | ICD-10-CM

## 2015-10-22 DIAGNOSIS — I2109 ST elevation (STEMI) myocardial infarction involving other coronary artery of anterior wall: Secondary | ICD-10-CM | POA: Diagnosis not present

## 2015-10-22 DIAGNOSIS — I2510 Atherosclerotic heart disease of native coronary artery without angina pectoris: Secondary | ICD-10-CM

## 2015-10-22 DIAGNOSIS — I255 Ischemic cardiomyopathy: Secondary | ICD-10-CM

## 2015-10-22 MED ORDER — DICLOFENAC SODIUM 1 % TD GEL: 4.0000 g | Freq: Four times a day (QID) | TRANSDERMAL | Status: DC

## 2015-10-22 NOTE — Patient Instructions (Signed)
Your physician has recommended you make the following change in your medication: STOP the omega 3 fish oil. The diclofinac tablets has been replaced with diclofinac gel. This has been sent to your pharmacy. Future refills will need to come through your PCP or orthopedic doctor.  Your physician has requested that you have an echocardiogram. Echocardiography is a painless test that uses sound waves to create images of your heart. It provides your doctor with information about the size and shape of your heart and how well your heart's chambers and valves are working. This procedure takes approximately one hour. There are no restrictions for this procedure. This will be scheduled in February 2017.  Your physician recommends that you return for fasting lab work in: February 2017. Lab slips has been provided to you today.  Your physician recommends that you schedule a follow-up appointment in: February with Dr. [name].  You have been referred to cardiac rehab.

## 2015-10-22 NOTE — Progress Notes (Signed)
Cardiology Office Note   Date:  10/22/2015   ID:  [name], DOB 09-Dec-1955, MRN [id number]  PCP:  [name], MD  Cardiologist:  Dr Marchelle Folks, PA-C   Chief Complaint  Patient presents with  . Hospitalization Follow-up    no light headedness or dizziness  . Chest Pain    pt states that he feels ike a twinge but doesn't really know what to call it, states its not a pain but an ache on a scale of 1-10 he would say its a 2  . Shortness of Breath    only when walking around a lot and if talking for a few minutes not sure really   . Edema    no edema     History of Present Illness: Joseph Riddle is a 59 y.o. male with a history of STEMI w/ DES LAD 11/12, ICM w/ EF 30-35% by echo 11/14, DJD, HL.   Joseph Riddle presents for post hospital follow-up and evaluation.  Since leaving the hospital, Mr. Sater has had some mild twinges of chest pain that were left lateral chest. There was no radiation. The symptoms were not clearly exertional and resolved without intervention. He has not had the symptoms that sent him to the hospital.  He has also noticed mild dyspnea on exertion. He has not been that active since going home, but if he walks for significant distances or is talking to people for a long period of time, he has to take an occasional deep breath.  He can tell that his heart is getting better circulation as in general he feels better. He has no lower extremity edema, no orthopnea and no PND.  He is eating healthier diet, and is tolerating his medications well.   Past Medical History  Diagnosis Date  . Arthritis   . STEMI (ST elevation myocardial infarction) ([place]) 10/05/2015    DES LAD, residual diagonal 90%, medical Rx  . Ischemic cardiomyopathy 09/2015    EF 30-35 percent by echo 10/07/2015  . Dyslipidemia 09/2015    Past Surgical History  Procedure Laterality Date  . Meniscus repair    . Foot surgery Left 10/2013    Dr. [name]  . Cardiac  catheterization N/A 10/05/2015    Procedure: Left Heart Cath;  Surgeon: Peter M Martinique, MD;  Location: [facility];  Service: Cardiovascular;  Laterality: N/A;  . Cardiac catheterization  10/05/2015    Procedure: Coronary Stent Intervention;  Surgeon: Peter M Martinique, MD;  Location: [facility];  Service: Cardiovascular;;    Current Outpatient Prescriptions  Medication Sig Dispense Refill  . acetaminophen (TYLENOL) 325 MG tablet Take 2 tablets (650 mg total) by mouth every 4 (four) hours as needed for headache or mild pain.    Marland Kitchen aspirin 81 MG chewable tablet Chew 1 tablet (81 mg total) by mouth daily.    Marland Kitchen atorvastatin (LIPITOR) 80 MG tablet Take 1 tablet (80 mg total) by mouth daily at 6 PM. 30 tablet 11  . diclofenac (VOLTAREN) 75 MG EC tablet TAKE ONE TABLET BY MOUTH TWICE DAILY AFTER MEALS 60 tablet 11  . doxycycline (DORYX) 100 MG EC tablet Take 50 mg by mouth 2 (two) times daily as needed.    . fish oil-omega-3 fatty acids 1000 MG capsule Take 2 g by mouth daily.    Marland Kitchen gabapentin (NEURONTIN) 300 MG capsule TAKE 1 CAPSULE AT MIDDAY AND 3 CAPSULES AT BEDTIME 120 capsule 11  .  Multiple Vitamin (MULTIVITAMIN) tablet Take 1 tablet by mouth daily.    . nitroGLYCERIN (NITROSTAT) 0.4 MG SL tablet Place 1 tablet (0.4 mg total) under the tongue every 5 (five) minutes x 3 doses as needed for chest pain. 25 tablet 2  . pantoprazole (PROTONIX) 40 MG tablet Take 1 tablet (40 mg total) by mouth daily. 30 tablet 2  . ticagrelor (BRILINTA) 90 MG TABS tablet Take 1 tablet (90 mg total) by mouth 2 (two) times daily. 60 tablet 11  . zolpidem (AMBIEN) 10 MG tablet Take 0.5-1 tablets (5-10 mg total) by mouth at bedtime as needed for sleep. 30 tablet 5   No current facility-administered medications for this visit.    Allergies:   Review of patient's allergies indicates no known allergies.    Social History:  The patient  reports that he quit smoking about 15 years ago. His smoking use included  Cigarettes. He quit after 15 years of use. He has never used smokeless tobacco. He reports that he does not drink alcohol or use illicit drugs.   Family History:  The patient's family history includes Heart disease in his father and maternal grandfather; Rheumatic fever in his mother.    ROS:  Please see the history of present illness. All other systems are reviewed and negative.    PHYSICAL EXAM: VS:  BP 98/74 mmHg  Pulse 67  Ht 5\' 10"  (1.778 m)  Wt 152 lb 3.2 oz (69.037 kg)  BMI 21.84 kg/m2 , BMI Body mass index is 21.84 kg/(m^2). GEN: Well nourished, well developed, male in no acute distress HEENT: normal for age  Neck: no JVD, no carotid bruit, no masses Cardiac: RRR; no murmur, no rubs, or gallops Respiratory:  clear to auscultation bilaterally, normal work of breathing GI: soft, nontender, nondistended, + BS MS: no deformity or atrophy; no edema; distal pulses are 2+ in all 4 extremities  Skin: warm and dry, no rash Neuro:  Strength and sensation are intact Psych: euthymic mood, full affect   EKG:  EKG is not ordered today.  Recent Labs: 10/05/2015: ALT 96*; TSH 0.397 10/07/2015: B Natriuretic Peptide 338.9* 10/08/2015: BUN 18; Creatinine, Ser 1.03; Hemoglobin 15.1; Platelets 238; Potassium 4.0; Sodium 138    Lipid Panel    Component Value Date/Time   CHOL 126 10/06/2015 0320   TRIG 52 10/06/2015 0320   HDL 35* 10/06/2015 0320   CHOLHDL 3.6 10/06/2015 0320   VLDL 10 10/06/2015 0320   LDLCALC 81 10/06/2015 0320     Wt Readings from Last 3 Encounters:  10/22/15 152 lb 3.2 oz (69.037 kg)  10/05/15 151 lb 3.8 oz (68.6 kg)  05/09/15 159 lb 3.2 oz (72.213 kg)     Other studies Reviewed: Additional studies/ records that were reviewed today include: Hospital records and previous ECGs.  ASSESSMENT AND PLAN:  1.  Left chest pain: I discussed the situation with the patient shouldn't. I advised him that he had a small vessel which could not be fixed. However, I  reassured him that it supplies a very small area of heart muscle. It will not cause him significant problems.   However, if he does feel some chest pain he should manage it with rest or nitroglycerin. I encouraged him to take sublingual nitroglycerin as needed. If one sublingual nitroglycerin relieves his chest pain, he does not need to worry about it but should note it. He understands that if more than one sublingual nitroglycerin is used he needs to either call us or  call 911. I encouraged him to attend cardiac rehabilitation the patient states he will do so. He is tolerating his medications well.  2. Dyslipidemia: His HDL was a little low and his LDL was 81. The goal for his LDL is less than 70. He is on high-dose statin at this time and is to continue this. We will check a lipid and liver function testing prior to his next office visit. He was taking omega-3 prior to admission and has not been taking this since then. I advised him that if he wished to stay off this that was okay but he could restart it at his discretion.  3. Arthritis: He has significant right knee problems and needs a knee replacement. Prior to admission, he was taking Voltaren. He has not taken this since the surgery but is struggling to walk and requires cane. He had planned to have a knee replacement in April of next year.  Advised him that with the aspirin and Brilinta, he really needed to stay off the Voltaren. I advised him he could use Voltaren gel on his knee and he could take Tylenol as needed. Steroid injections are also okay, as long as he does not have to come off the [place]. He understands that he needs to continue dual antiplatelet therapy for a year.  4. Ischemic cardiomyopathy: His EF was 30-35 percent at cath and by echo. He will get an echo in 3 months prior to his follow-up visit with Dr. [name]. He is having no symptoms or signs of volume overload.   Current medicines are reviewed at length with the patient  today.  The patient has concerns regarding medicines.  The following changes have been made:  Do not restart Voltaren, okay to stay off the omega-3's, okay to take Tylenol, add Voltaren gel  Labs/ tests ordered today include:   Orders Placed This Encounter  Procedures  . Hepatic function panel  . Lipid panel  . Amb Referral to Cardiac Rehabilitation  . ECHOCARDIOGRAM COMPLETE    Disposition:   FU with Dr. [name] in 3 months  Signed, Lenoard Aden  10/22/2015 11:44 AM    [address], [place], [place]  [address] Phone: [phone]; Fax: [phone]

## 2015-10-24 ENCOUNTER — Telehealth: Payer: Self-pay

## 2015-10-24 NOTE — Telephone Encounter (Signed)
Diclofenac gel 1% approved by [employer]. Patient and pharmacy aware.

## 2015-10-25 NOTE — Telephone Encounter (Signed)
Voltaren gel approved.

## 2015-11-07 ENCOUNTER — Encounter: Payer: BLUE CROSS/BLUE SHIELD | Admitting: Physician Assistant

## 2015-11-07 ENCOUNTER — Telehealth (HOSPITAL_COMMUNITY): Payer: Self-pay | Admitting: *Deleted

## 2015-11-07 NOTE — Telephone Encounter (Signed)
Received signed MD order. Message left on answering machine for pt to please contact.  Contact number provided. Cherre Huger, BSN

## 2015-11-11 ENCOUNTER — Ambulatory Visit (INDEPENDENT_AMBULATORY_CARE_PROVIDER_SITE_OTHER): Payer: BLUE CROSS/BLUE SHIELD | Admitting: Physician Assistant

## 2015-11-11 ENCOUNTER — Encounter: Payer: Self-pay | Admitting: Physician Assistant

## 2015-11-11 VITALS — BP 121/78 | HR 66 | Temp 97.0°F | Resp 16 | Ht 69.0 in | Wt 149.0 lb

## 2015-11-11 DIAGNOSIS — Z125 Encounter for screening for malignant neoplasm of prostate: Secondary | ICD-10-CM | POA: Diagnosis not present

## 2015-11-11 DIAGNOSIS — G4700 Insomnia, unspecified: Secondary | ICD-10-CM

## 2015-11-11 DIAGNOSIS — M25561 Pain in right knee: Secondary | ICD-10-CM

## 2015-11-11 DIAGNOSIS — G8929 Other chronic pain: Secondary | ICD-10-CM

## 2015-11-11 DIAGNOSIS — M150 Primary generalized (osteo)arthritis: Secondary | ICD-10-CM | POA: Diagnosis not present

## 2015-11-11 DIAGNOSIS — Z130 Encounter for screening for diseases of the blood and blood-forming organs and certain disorders involving the immune mechanism: Secondary | ICD-10-CM

## 2015-11-11 DIAGNOSIS — M159 Polyosteoarthritis, unspecified: Secondary | ICD-10-CM

## 2015-11-11 DIAGNOSIS — Z13228 Encounter for screening for other metabolic disorders: Secondary | ICD-10-CM

## 2015-11-11 DIAGNOSIS — Z0000 Encounter for general adult medical examination without abnormal findings: Secondary | ICD-10-CM

## 2015-11-11 LAB — CBC
HCT: 45.1 % (ref 39.0–52.0)
HEMOGLOBIN: 15.9 g/dL (ref 13.0–17.0)
MCH: 31.5 pg (ref 26.0–34.0)
MCHC: 35.3 g/dL (ref 30.0–36.0)
MCV: 89.5 fL (ref 78.0–100.0)
MPV: 9.9 fL (ref 8.6–12.4)
Platelets: 251 10*3/uL (ref 150–400)
RBC: 5.04 MIL/uL (ref 4.22–5.81)
RDW: 12.8 % (ref 11.5–15.5)
WBC: 6.5 10*3/uL (ref 4.0–10.5)

## 2015-11-11 MED ORDER — DICLOFENAC SODIUM 1 % TD GEL: 4.0000 g | Freq: Four times a day (QID) | TRANSDERMAL | Status: DC

## 2015-11-11 MED ORDER — ZOLPIDEM TARTRATE 10 MG PO TABS: 5.0000 mg | ORAL_TABLET | Freq: Every evening | ORAL | Status: DC | PRN

## 2015-11-11 MED ORDER — GABAPENTIN 300 MG PO CAPS: ORAL_CAPSULE | ORAL | Status: DC

## 2015-11-11 NOTE — Patient Instructions (Signed)
I will have your lab results within the next 10 days. I have refilled your prescriptions.    Keeping you healthy  Get these tests  Blood pressure- Have your blood pressure checked once a year by your healthcare provider.  Normal blood pressure is 120/80  Weight- Have your body mass index (BMI) calculated to screen for obesity.  BMI is a measure of body fat based on height and weight. You can also calculate your own BMI at ViewBanking.si.  Cholesterol- Have your cholesterol checked every year.  Diabetes- Have your blood sugar checked regularly if you have high blood pressure, high cholesterol, have a family history of diabetes or if you are overweight.  Screening for Colon Cancer- Colonoscopy starting at age 53.  Screening may begin sooner depending on your family history and other health conditions. Follow up colonoscopy as directed by your Gastroenterologist.  Screening for Prostate Cancer- Both blood work (PSA) and a rectal exam help screen for Prostate Cancer.  Screening begins at age 82 with African-American men and at age 88 with Caucasian men.  Screening may begin sooner depending on your family history.  Take these medicines  Aspirin- One aspirin daily can help prevent Heart disease and Stroke.  Flu shot- Every fall.  Tetanus- Every 10 years.  Zostavax- Once after the age of 70 to prevent Shingles.  Pneumonia shot- Once after the age of 30; if you are younger than 4, ask your healthcare provider if you need a Pneumonia shot.  Take these steps  Don't smoke- If you do smoke, talk to your doctor about quitting.  For tips on how to quit, go to www.smokefree.gov or call 1-800-QUIT-NOW.  Be physically active- Exercise 5 days a week for at least 30 minutes.  If you are not already physically active start slow and gradually work up to 30 minutes of moderate physical activity.  Examples of moderate activity include walking briskly, mowing the yard, dancing, swimming,  bicycling, etc.  Eat a healthy diet- Eat a variety of healthy food such as fruits, vegetables, low fat milk, low fat cheese, yogurt, lean meant, poultry, fish, beans, tofu, etc. For more information go to www.thenutritionsource.org  Drink alcohol in moderation- Limit alcohol intake to less than two drinks a day. Never drink and drive.  Dentist- Brush and floss twice daily; visit your dentist twice a year.  Depression- Your emotional health is as important as your physical health. If you're feeling down, or losing interest in things you would normally enjoy please talk to your healthcare provider.  Eye exam- Visit your eye doctor every year.  Safe sex- If you may be exposed to a sexually transmitted infection, use a condom.  Seat belts- Seat belts can save your life; always wear one.  Smoke/Carbon Monoxide detectors- These detectors need to be installed on the appropriate level of your home.  Replace batteries at least once a year.  Skin cancer- When out in the sun, cover up and use sunscreen 15 SPF or higher.  Violence- If anyone is threatening you, please tell your healthcare provider.  Living Will/ Health care power of attorney- Speak with your healthcare provider and family.

## 2015-11-11 NOTE — Progress Notes (Signed)
[facility] [address] [place] [address] [phone]  Date:  11/11/2015   Name:  Joseph Riddle   DOB:  Feb 26, 1956   MRN:  [id number]  PCP:  [name], MD    History of Present Illness:  Joseph Riddle is a 60 y.o. male with recent PMH of MI s/p LAD DES patient who presents to [facility]  For annual physical exam.  This occurred about 2 months ago.  This is followed by [facility].  Today he has no complaints or concerns.  --Diet: eating chicken potatoes, green beans.  No fat, sprite or 2.  Eat a lot of grapes and fruit.  Water intake: drinks very little water.  In favor of sprite.    --BM: Normal.  No blood in stool, diarrhea, constipation.    --Urination: normal, dysuria, hematuria, frequency  --Sleep: taking the Ambien and gabapentin which helped with his sleep--as well as the knee pain.  10 and 11   Patient Active Problem List   Diagnosis Date Noted  . Cardiomyopathy, ischemic 10/08/2015  . Hypotension 10/08/2015  . CAD S/P LAD DES 10/05/15   . Ischemic chest pain ([place])   . Dyslipidemia   . ST elevation (STEMI) myocardial infarction involving left anterior descending coronary artery ([place]) 10/05/2015  . ST elevation myocardial infarction (STEMI) involving left anterior descending (LAD) coronary artery with complication ([place]) [id number]  . Neck pain on left side 06/19/2014  . Chest pain 06/19/2014  . Palpitations 06/19/2014  . DDD (degenerative disc disease), cervical 07/13/2012  . Degenerative joint disease involving multiple joints 03/10/2012  . Insomnia 03/10/2012    Past Medical History  Diagnosis Date  . Arthritis   . STEMI (ST elevation myocardial infarction) ([place]) 10/05/2015    DES LAD, residual diagonal 90%, medical Rx  . Ischemic cardiomyopathy 09/2015    EF 30-35 percent by echo 10/07/2015  . Dyslipidemia 09/2015    Past Surgical History  Procedure Laterality Date  . Meniscus repair    . Foot surgery Left 10/2013    Dr. [name]  . Cardiac catheterization N/A 10/05/2015    Procedure: Left Heart Cath;  Surgeon: Peter M Martinique, MD;  Location: [facility];  Service: Cardiovascular;  Laterality: N/A;  . Cardiac catheterization  10/05/2015    Procedure: Coronary Stent Intervention;  Surgeon: Peter M Martinique, MD;  Location: [facility];  Service: Cardiovascular;;    Social History  Substance Use Topics  . Smoking status: Former Smoker -- 15 years    Types: Cigarettes    Quit date: 07/24/2000  . Smokeless tobacco: Never Used  . Alcohol Use: 0.0 oz/week    0 Standard drinks or equivalent per week     Comment: very occasionally - mixed drinks    Family History  Problem Relation Age of Onset  . Heart disease Maternal Grandfather   . Rheumatic fever Mother   . Heart disease Father     No Known Allergies  Medication list has been reviewed and updated.  Current Outpatient Prescriptions on File Prior to Visit  Medication Sig Dispense Refill  . acetaminophen (TYLENOL) 325 MG tablet Take 2 tablets (650 mg total) by mouth every 4 (four) hours as needed for headache or mild pain.    Marland Kitchen aspirin 81 MG chewable tablet Chew 1 tablet (81 mg total) by mouth daily.    Marland Kitchen atorvastatin (LIPITOR) 80 MG tablet Take 1 tablet (80 mg total) by mouth daily at 6  PM. 30 tablet 11  . diclofenac sodium (VOLTAREN) 1 % GEL Apply 4 g topically 4 (four) times daily. 100 g 0  . doxycycline (DORYX) 100 MG EC tablet Take 50 mg by mouth 2 (two) times daily as needed.    . gabapentin (NEURONTIN) 300 MG capsule TAKE 1 CAPSULE AT MIDDAY AND 3 CAPSULES AT BEDTIME 120 capsule 11  . Multiple Vitamin (MULTIVITAMIN) tablet Take 1 tablet by mouth daily.    . nitroGLYCERIN (NITROSTAT) 0.4 MG SL tablet Place 1 tablet (0.4 mg total) under the tongue every 5 (five) minutes x 3 doses as needed for chest pain. 25 tablet 2  . pantoprazole (PROTONIX) 40 MG tablet Take 1 tablet (40 mg total) by mouth daily. 30 tablet 2  . ticagrelor (BRILINTA)  90 MG TABS tablet Take 1 tablet (90 mg total) by mouth 2 (two) times daily. 60 tablet 11  . zolpidem (AMBIEN) 10 MG tablet Take 0.5-1 tablets (5-10 mg total) by mouth at bedtime as needed for sleep. 30 tablet 5   No current facility-administered medications on file prior to visit.    Review of Systems  Constitutional: Negative for fever and chills.  HENT: Negative for ear discharge, ear pain and sore throat.   Eyes: Negative for blurred vision and double vision.  Respiratory: Negative for cough, shortness of breath and wheezing.   Cardiovascular: Negative for chest pain, palpitations and leg swelling.  Gastrointestinal: Negative for nausea, vomiting and diarrhea.  Genitourinary: Negative for dysuria, frequency and hematuria.  Musculoskeletal: Positive for joint pain (knee pain).  Skin: Negative for itching and rash.  Neurological: Negative for dizziness and headaches.     Physical Examination: BP 121/78 mmHg  Pulse 66  Temp(Src) 97 F (36.1 C) (Oral)  Resp 16  Ht 5\' 9"  (1.753 m)  Wt 149 lb (67.586 kg)  BMI 21.99 kg/m2 Ideal Body Weight: Weight in (lb) to have BMI = 25: 168.9  Physical Exam  Constitutional: He is oriented to person, place, and time. He appears well-developed and well-nourished. No distress.  HENT:  Head: Normocephalic and atraumatic.  Right Ear: Tympanic membrane, external ear and ear canal normal.  Left Ear: Tympanic membrane, external ear and ear canal normal.  Eyes: Conjunctivae and EOM are normal. Pupils are equal, round, and reactive to light.  Cardiovascular: Normal rate and regular rhythm.  Exam reveals no friction rub.   No murmur heard. Pulmonary/Chest: Effort normal. No respiratory distress. He has no wheezes.  Abdominal: Soft. Bowel sounds are normal. He exhibits no distension and no mass. There is no tenderness.  Musculoskeletal: Normal range of motion. He exhibits no edema or tenderness.       Right knee: He exhibits deformity.  Neurological:  He is alert and oriented to person, place, and time. He displays normal reflexes.  Skin: Skin is warm and dry. He is not diaphoretic.  Psychiatric: He has a normal mood and affect. His behavior is normal.     Assessment and Plan: Joseph Riddle is a 59 y.o. male who is here today for annual physical exam. --heart care follow up with echocardiogram in February. --he will start cardiovascular therapy within the week. --I am refilling medications for knee pain and sleep at this time.  They have switched to the gel diclofenac, and I will refill this. --he wishes to have knee repair next spring, and pending cardiology clearance, this may happen for him --advised heavy water hydration.  Annual physical exam - Plan: COMPLETE METABOLIC PANEL WITH  GFR, PSA, CBC, CANCELED: POCT CBC  Screening for metabolic disorder - Plan: COMPLETE METABOLIC PANEL WITH GFR  Screening for prostate cancer - Plan: PSA  Screening for deficiency anemia - Plan: CBC, CANCELED: POCT CBC  Insomnia - Plan: zolpidem (AMBIEN) 10 MG tablet  Primary osteoarthritis involving multiple joints - Plan: gabapentin (NEURONTIN) 300 MG capsule  Chronic knee pain, right - Plan: diclofenac sodium (VOLTAREN) 1 % GEL, gabapentin (NEURONTIN) 300 MG capsule  Ivar Drape, PA-C [facility] 11/11/2015 2:53 PM

## 2015-11-12 LAB — COMPLETE METABOLIC PANEL WITH GFR
ALBUMIN: 4.4 g/dL (ref 3.6–5.1)
ALK PHOS: 58 U/L (ref 40–115)
ALT: 32 U/L (ref 9–46)
AST: 35 U/L (ref 10–35)
BUN: 15 mg/dL (ref 7–25)
CO2: 28 mmol/L (ref 20–31)
Calcium: 9.4 mg/dL (ref 8.6–10.3)
Chloride: 102 mmol/L (ref 98–110)
Creat: 0.97 mg/dL (ref 0.70–1.25)
GFR, Est African American: >89 mL/min (ref >=60)
GFR, Est Non African American: 84 mL/min (ref >=60)
GLUCOSE: 84 mg/dL (ref 65–99)
POTASSIUM: 4 mmol/L (ref 3.5–5.3)
SODIUM: 142 mmol/L (ref 135–146)
Total Bilirubin: 1 mg/dL (ref 0.2–1.2)
Total Protein: 6.9 g/dL (ref 6.1–8.1)

## 2015-11-12 LAB — PSA: PSA: 0.32 ng/mL (ref <=4.00)

## 2015-11-14 ENCOUNTER — Encounter (HOSPITAL_COMMUNITY)
Admission: RE | Admit: 2015-11-14 | Discharge: 2015-11-14 | Disposition: A | Discharge status: Home / Self Care | Payer: BLUE CROSS/BLUE SHIELD | Source: Ambulatory Visit | Attending: Internal Medicine | Admitting: Internal Medicine

## 2015-11-14 ENCOUNTER — Encounter: Payer: Self-pay | Admitting: Physician Assistant

## 2015-11-14 DIAGNOSIS — I2510 Atherosclerotic heart disease of native coronary artery without angina pectoris: Secondary | ICD-10-CM | POA: Insufficient documentation

## 2015-11-14 DIAGNOSIS — I2109 ST elevation (STEMI) myocardial infarction involving other coronary artery of anterior wall: Secondary | ICD-10-CM | POA: Insufficient documentation

## 2015-11-14 NOTE — Progress Notes (Signed)
Cardiac Rehab Medication Review by a Pharmacist  Does the patient  feel that his/her medications are working for him/her?  yes  Has the patient been experiencing any side effects to the medications prescribed?  yes  Does the patient measure his/her own blood pressure or blood glucose at home?  yes   Does the patient have any problems obtaining medications due to transportation or finances?   no  Understanding of regimen: good Understanding of indications: good Potential of compliance: good  Pharmacist comments: Pt states that he takes his blood pressure at home and writes down the values. He has a good understanding of his medications. We had a discussion about his recent stent placement and that he is taking the DAPT medications to prevent stent thrombosis. He now has a better understand of his regimen and the purpose and importance of his medications.   Joseph Riddle, PharmD Pharmacy Resident  Pager: [phone] 11/14/2015 8:23 AM

## 2015-11-20 ENCOUNTER — Encounter (HOSPITAL_COMMUNITY)
Admission: RE | Admit: 2015-11-20 | Discharge: 2015-11-20 | Disposition: A | Discharge status: Home / Self Care | Payer: BLUE CROSS/BLUE SHIELD | Source: Ambulatory Visit | Attending: Internal Medicine | Admitting: Internal Medicine

## 2015-11-20 DIAGNOSIS — I2109 ST elevation (STEMI) myocardial infarction involving other coronary artery of anterior wall: Secondary | ICD-10-CM | POA: Diagnosis not present

## 2015-11-20 DIAGNOSIS — I2510 Atherosclerotic heart disease of native coronary artery without angina pectoris: Secondary | ICD-10-CM | POA: Diagnosis present

## 2015-11-20 NOTE — Progress Notes (Signed)
Pt started exercise today at 11:15 the phase II cardiac rehab program.  Pt tolerated light exercise without difficulty. Pt used cane to assist with ambulation.  Pt need knee replacement but will have to wait one year due to antiplatelet therapy. VSS, telemetry-Sr with inverted t wave, asymptomatic.  Medication list reconciled.  Pt verbalized compliance with medications and denies barriers to compliance. PSYCHOSOCIAL ASSESSMENT:  PHQ-0. Pt exhibits positive coping skills, hopeful outlook with supportive family. Pt has grandchild in the home and second grader who keeps him pretty busy. No psychosocial needs identified at this time, no psychosocial interventions necessary.    Pt enjoys spending time with family.  At this point in his life his interests are task related to caring for the children of the home.   Pt cardiac rehab  goal is  to get the heart back to normal increase strength and endurance.  Will measure MET levels to monitor his progression toward meeting this goal .  Pt encouraged to participate in home exercise on a consistent basis to increase ability to achieve these goals.   Pt long term cardiac rehab goal is improve stress management and gain exercise knowledge and improve diet. Pt encouraged to participate in education classes and nutritional classes as able with his work schedule.  Pt oriented to exercise equipment and routine.  Understanding verbalized. Cherre Huger, BSN

## 2015-11-22 ENCOUNTER — Encounter (HOSPITAL_COMMUNITY)
Admission: RE | Admit: 2015-11-22 | Discharge: 2015-11-22 | Disposition: A | Discharge status: Home / Self Care | Payer: BLUE CROSS/BLUE SHIELD | Source: Ambulatory Visit | Attending: Internal Medicine | Admitting: Internal Medicine

## 2015-11-22 DIAGNOSIS — I2510 Atherosclerotic heart disease of native coronary artery without angina pectoris: Secondary | ICD-10-CM | POA: Diagnosis not present

## 2015-11-27 ENCOUNTER — Encounter (HOSPITAL_COMMUNITY)
Admission: RE | Admit: 2015-11-27 | Discharge: 2015-11-27 | Disposition: A | Discharge status: Home / Self Care | Payer: BLUE CROSS/BLUE SHIELD | Source: Ambulatory Visit | Attending: Internal Medicine | Admitting: Internal Medicine

## 2015-11-27 DIAGNOSIS — I2510 Atherosclerotic heart disease of native coronary artery without angina pectoris: Secondary | ICD-10-CM | POA: Diagnosis present

## 2015-11-27 DIAGNOSIS — I2109 ST elevation (STEMI) myocardial infarction involving other coronary artery of anterior wall: Secondary | ICD-10-CM | POA: Diagnosis not present

## 2015-11-27 NOTE — Progress Notes (Signed)
Reviewed home exercise with pt today.  Pt plans to walk some and to use stationary bike for arms and possibly legs for exercise.  Reviewed THR, pulse, RPE, sign and symptoms, NTG use, and when to call 911 or MD.  Pt voiced understanding. 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 Alberteen Sam, MA, ACSM RCEP

## 2015-11-29 ENCOUNTER — Encounter (HOSPITAL_COMMUNITY)
Admission: RE | Admit: 2015-11-29 | Discharge: 2015-11-29 | Disposition: A | Discharge status: Home / Self Care | Payer: BLUE CROSS/BLUE SHIELD | Source: Ambulatory Visit | Attending: Internal Medicine | Admitting: Internal Medicine

## 2015-11-29 DIAGNOSIS — I2510 Atherosclerotic heart disease of native coronary artery without angina pectoris: Secondary | ICD-10-CM | POA: Diagnosis not present

## 2015-11-29 NOTE — Progress Notes (Signed)
Pt plans to discharge early on January 11th. Due to increasing work duties during the tax season. QUALITY OF LIFE SCORE REVIEW  Pt completed Quality of Life survey as a participant in Cardiac Rehab. Scores 21.0 or below are considered low. Pt score very low in the area of health and function - 18.83. Overall 22.15, socioeconomic 27.71, physiological and spiritual 21.0, family 25.90. Patient quality of life slightly altered by physical constraints which limits his ability to perform as prior to recent cardiac illness and the need to have orthopedic surgery. Pt desires to be able to do more activities but at this time he is hindered and needs the help of a cane.  Offered emotional support and reassurance.  Will continue to monitor and intervene as necessary.  Cherre Huger, BSN

## 2015-12-02 ENCOUNTER — Encounter (HOSPITAL_COMMUNITY): Payer: BLUE CROSS/BLUE SHIELD

## 2015-12-04 ENCOUNTER — Encounter (HOSPITAL_COMMUNITY): Payer: BLUE CROSS/BLUE SHIELD

## 2015-12-04 ENCOUNTER — Telehealth (HOSPITAL_COMMUNITY): Payer: Self-pay | Admitting: *Deleted

## 2015-12-06 ENCOUNTER — Encounter (HOSPITAL_COMMUNITY): Payer: BLUE CROSS/BLUE SHIELD

## 2015-12-09 ENCOUNTER — Encounter (HOSPITAL_COMMUNITY): Payer: BLUE CROSS/BLUE SHIELD

## 2015-12-11 ENCOUNTER — Encounter (HOSPITAL_COMMUNITY): Payer: BLUE CROSS/BLUE SHIELD

## 2015-12-11 ENCOUNTER — Encounter: Payer: Self-pay | Admitting: Gastroenterology

## 2015-12-13 ENCOUNTER — Encounter (HOSPITAL_COMMUNITY): Payer: BLUE CROSS/BLUE SHIELD

## 2015-12-16 ENCOUNTER — Encounter (HOSPITAL_COMMUNITY): Payer: BLUE CROSS/BLUE SHIELD

## 2015-12-17 ENCOUNTER — Other Ambulatory Visit: Payer: Self-pay | Admitting: Cardiology

## 2015-12-17 NOTE — Telephone Encounter (Signed)
Rx(s) sent to pharmacy electronically.  

## 2015-12-18 ENCOUNTER — Encounter (HOSPITAL_COMMUNITY): Payer: BLUE CROSS/BLUE SHIELD

## 2015-12-20 ENCOUNTER — Encounter (HOSPITAL_COMMUNITY): Payer: BLUE CROSS/BLUE SHIELD

## 2015-12-23 ENCOUNTER — Encounter (HOSPITAL_COMMUNITY): Payer: BLUE CROSS/BLUE SHIELD

## 2015-12-25 ENCOUNTER — Encounter (HOSPITAL_COMMUNITY): Payer: BLUE CROSS/BLUE SHIELD

## 2015-12-27 ENCOUNTER — Encounter (HOSPITAL_COMMUNITY): Payer: BLUE CROSS/BLUE SHIELD

## 2015-12-30 ENCOUNTER — Encounter (HOSPITAL_COMMUNITY): Payer: BLUE CROSS/BLUE SHIELD

## 2016-01-01 ENCOUNTER — Other Ambulatory Visit: Payer: Self-pay

## 2016-01-01 ENCOUNTER — Encounter (HOSPITAL_COMMUNITY): Payer: BLUE CROSS/BLUE SHIELD

## 2016-01-01 ENCOUNTER — Ambulatory Visit (HOSPITAL_COMMUNITY): Payer: BLUE CROSS/BLUE SHIELD | Attending: Physician Assistant

## 2016-01-01 DIAGNOSIS — R002 Palpitations: Secondary | ICD-10-CM | POA: Diagnosis not present

## 2016-01-01 DIAGNOSIS — I959 Hypotension, unspecified: Secondary | ICD-10-CM | POA: Diagnosis not present

## 2016-01-01 DIAGNOSIS — I517 Cardiomegaly: Secondary | ICD-10-CM | POA: Insufficient documentation

## 2016-01-01 DIAGNOSIS — Z87891 Personal history of nicotine dependence: Secondary | ICD-10-CM | POA: Diagnosis not present

## 2016-01-01 DIAGNOSIS — I255 Ischemic cardiomyopathy: Secondary | ICD-10-CM | POA: Diagnosis not present

## 2016-01-01 DIAGNOSIS — E785 Hyperlipidemia, unspecified: Secondary | ICD-10-CM | POA: Insufficient documentation

## 2016-01-02 LAB — HEPATIC FUNCTION PANEL
ALK PHOS: 64 U/L (ref 40–115)
ALT: 32 U/L (ref 9–46)
AST: 33 U/L (ref 10–35)
Albumin: 4.1 g/dL (ref 3.6–5.1)
BILIRUBIN DIRECT: 0.3 mg/dL — AB (ref <=0.2)
BILIRUBIN INDIRECT: 0.6 mg/dL (ref 0.2–1.2)
BILIRUBIN TOTAL: 0.9 mg/dL (ref 0.2–1.2)
Total Protein: 6.2 g/dL (ref 6.1–8.1)

## 2016-01-02 LAB — LIPID PANEL
CHOL/HDL RATIO: 2.8 ratio (ref <=5.0)
Cholesterol: 93 mg/dL — ABNORMAL LOW (ref 125–200)
HDL: 33 mg/dL — AB (ref >=40)
LDL CALC: 46 mg/dL (ref <130)
Triglycerides: 71 mg/dL (ref <150)
VLDL: 14 mg/dL (ref <30)

## 2016-01-03 ENCOUNTER — Encounter (HOSPITAL_COMMUNITY): Payer: BLUE CROSS/BLUE SHIELD

## 2016-01-06 ENCOUNTER — Encounter (HOSPITAL_COMMUNITY): Payer: BLUE CROSS/BLUE SHIELD

## 2016-01-08 ENCOUNTER — Encounter: Payer: Self-pay | Admitting: Internal Medicine

## 2016-01-08 ENCOUNTER — Ambulatory Visit (INDEPENDENT_AMBULATORY_CARE_PROVIDER_SITE_OTHER): Payer: BLUE CROSS/BLUE SHIELD | Admitting: Internal Medicine

## 2016-01-08 ENCOUNTER — Encounter (HOSPITAL_COMMUNITY): Payer: BLUE CROSS/BLUE SHIELD

## 2016-01-08 VITALS — BP 102/64 | HR 68 | Ht 69.5 in | Wt 148.4 lb

## 2016-01-08 DIAGNOSIS — I255 Ischemic cardiomyopathy: Secondary | ICD-10-CM | POA: Diagnosis not present

## 2016-01-08 DIAGNOSIS — I2510 Atherosclerotic heart disease of native coronary artery without angina pectoris: Secondary | ICD-10-CM

## 2016-01-08 DIAGNOSIS — Z9861 Coronary angioplasty status: Secondary | ICD-10-CM | POA: Diagnosis not present

## 2016-01-08 DIAGNOSIS — E785 Hyperlipidemia, unspecified: Secondary | ICD-10-CM

## 2016-01-08 MED ORDER — ATORVASTATIN CALCIUM 40 MG PO TABS: 40.0000 mg | ORAL_TABLET | Freq: Every day | ORAL | Status: DC

## 2016-01-08 MED ORDER — LISINOPRIL 2.5 MG PO TABS: 2.5000 mg | ORAL_TABLET | Freq: Every day | ORAL | Status: DC

## 2016-01-08 NOTE — Patient Instructions (Signed)
Medication Instructions:   1. START lisinopril 2.5mg  once daily 2. DECREASE atorvastatin to 40mg  once daily  -- a new prescription has been sent to the pharmacy  Labwork:  none  Testing/Procedures:  none  Follow-Up:  Your physician wants you to follow-up in: 6 months with Dr. [name]. You will receive a reminder letter in the mail two months in advance. If you don't receive a letter, please call our office to schedule the follow-up appointment.   Any Other Special Instructions Will Be Listed Below (If Applicable).

## 2016-01-08 NOTE — Progress Notes (Signed)
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 He has had previous surgeries and had bad outcomes and says that he never wants to have surgery again but does appear to be in significant pain. Particularly in the left shoulder and left neck area. He has pain in the base of his neck that radiates to shoulder and down to the left side of his chest. This is somewhat the pain that he feels however sometimes when he is doing exercise or exerting himself he gets shortness of breath a little more discomfort in his chest. When it is hot outside he reports some palpitations. The symptoms sound somewhat atypical for cardiac chest pain however he is concerned that this could be cardiac chest pain. He reports that he was referred here for a stress test today, however he was referred for an evaluation for stress test.  Joseph Riddle returns today for follow-up of an anterior STEMI which she had in November 2016. I last saw him in 2015 when he was referred for chest pain. He underwent an exercise stress test that same day which was negative for ischemia. He supposedly did well over the next year but then presented acutely with chest pain. He says that that chest pain had gone on for several weeks and was found to have 100% occlusion proximally of the LAD. This was stented with a drug-eluting stent and his echocardiogram showed reduced LV function with EF of 30-35%. Posthospitalization he followed up with Rosaria Ferries, PA-C, who ordered another echocardiogram and that shows that there is been a slight increase in EF up to 35-40%. He also recently had lipid testing. His total cholesterol is 93 and LDL is 46. He is still on atorvastatin 80 mg daily.  This was a new medicine during his hospitalization. He reports being compliant on aspirin and Brilinta. He denies any chest pain or worsening shortness of breath. He has significant right knee pain and significant joint destruction with significant angulation of the leg. He was scheduled to get knee replacement surgery in April but had to postpone that. I instructed that he would not be able candidate for knee surgery at least until November 2017 per  PMHx:  Past Medical History  Diagnosis Date  . Arthritis   . STEMI (ST elevation myocardial infarction) ([place]) 10/05/2015    DES LAD, residual diagonal 90%, medical Rx  . Ischemic cardiomyopathy 09/2015    EF 30-35 percent by echo 10/07/2015  . Dyslipidemia 09/2015    Past Surgical History  Procedure Laterality Date  . Meniscus repair    . Foot surgery Left 10/2013    Dr. [name]  . Cardiac catheterization N/A 10/05/2015    Procedure: Left Heart Cath;  Surgeon: Peter M Martinique, MD;  Location: [facility];  Service: Cardiovascular;  Laterality: N/A;  . Cardiac catheterization  10/05/2015    Procedure: Coronary Stent Intervention;  Surgeon: Peter M Martinique, MD;  Location: [facility];  Service: Cardiovascular;;    FAMHx:  Family History  Problem Relation Age of Onset  . Heart disease Maternal Grandfather   . Rheumatic fever Mother   . Heart disease Father     SOCHx:   reports that he quit smoking about 15  years ago. His smoking use included Cigarettes. He quit after 15 years of use. He has never used smokeless tobacco. He reports that he drinks alcohol. He reports that he does not use illicit drugs.  ALLERGIES:  No Known Allergies  ROS: Pertinent items noted in HPI and remainder of comprehensive ROS otherwise negative.  HOME MEDS: Current Outpatient Prescriptions  Medication Sig Dispense Refill  . acetaminophen (TYLENOL) 325 MG tablet Take 2 tablets (650 mg total) by mouth every 4 (four) hours as needed for  headache or mild pain.    Marland Kitchen aspirin 81 MG chewable tablet Chew 1 tablet (81 mg total) by mouth daily.    Marland Kitchen atorvastatin (LIPITOR) 40 MG tablet Take 1 tablet (40 mg total) by mouth daily at 6 PM. 30 tablet 6  . diclofenac sodium (VOLTAREN) 1 % GEL Apply 4 g topically 4 (four) times daily. 100 g 0  . doxycycline (DORYX) 100 MG EC tablet Take 50 mg by mouth 2 (two) times daily as needed. Reported on 11/14/2015    . gabapentin (NEURONTIN) 300 MG capsule TAKE 1 CAPSULE AT MIDDAY AND 3 CAPSULES AT BEDTIME 120 capsule 11  . Multiple Vitamin (MULTIVITAMIN) tablet Take 1 tablet by mouth daily.    . nitroGLYCERIN (NITROSTAT) 0.4 MG SL tablet Place 1 tablet (0.4 mg total) under the tongue every 5 (five) minutes x 3 doses as needed for chest pain. 25 tablet 2  . ticagrelor (BRILINTA) 90 MG TABS tablet Take 1 tablet (90 mg total) by mouth 2 (two) times daily. 60 tablet 11  . zolpidem (AMBIEN) 10 MG tablet Take 0.5-1 tablets (5-10 mg total) by mouth at bedtime as needed for sleep. 30 tablet 5  . lisinopril (PRINIVIL,ZESTRIL) 2.5 MG tablet Take 1 tablet (2.5 mg total) by mouth daily. 30 tablet 6   No current facility-administered medications for this visit.    LABS/IMAGING: No results found for this or any previous visit (from the past 48 hour(s)). No results found.  VITALS: BP 102/64 mmHg  Pulse 68  Ht 5' 9.5" (1.765 m)  Wt 148 lb 6.4 oz (67.314 kg)  BMI 21.61 kg/m2  EXAM: General appearance: alert and no distress Neck: no carotid bruit and no JVD Lungs: clear to auscultation bilaterally Heart: regular rate and rhythm, S1, S2 normal, no murmur, click, rub or gallop Abdomen: soft, non-tender; bowel sounds normal; no masses,  no organomegaly Extremities: left shoulder droop, walks with a limp on the right knee Pulses: 2+ and symmetric Skin: Skin color, texture, turgor normal. No rashes or lesions Neurologic: Grossly normal Psych: Agitated, short-tempered,  digusted  EKG: Deferred  ASSESSMENT: 1. Coronary artery disease status post anterior STEMI in 09/2015 (s/p DES to proximal LAD) 2. Ischemic cardiomyopathy EF 35-40% 3. Low normal blood pressure 4. Significant knee OA   PLAN: 1.   Mr. Mally unfortunately suffered an anterior STEMI which was a late presentation. He had a drug-eluting stent to the LAD but does have anterior wall dysfunction and an EF which is 35-40%. He is on aspirin and Brilinta which cannot be stopped until November 2017. He is on 80 mg of Lipitor but his cholesterol is very low, and I believe he can cut that in half to 40 mg daily today. In addition, I like to start low-dose lisinopril 2.5 mg to give some benefit for heart failure. His blood pressure will likely not tolerate any higher doses than that.   Follow-up with me in 6 months.  Pixie Casino, MD, [facility]  Attending Cardiologist [place] 01/08/2016, 2:08 PM

## 2016-01-10 ENCOUNTER — Encounter (HOSPITAL_COMMUNITY): Payer: BLUE CROSS/BLUE SHIELD

## 2016-01-13 ENCOUNTER — Encounter (HOSPITAL_COMMUNITY): Payer: BLUE CROSS/BLUE SHIELD

## 2016-01-15 ENCOUNTER — Encounter (HOSPITAL_COMMUNITY): Payer: BLUE CROSS/BLUE SHIELD

## 2016-01-17 ENCOUNTER — Encounter (HOSPITAL_COMMUNITY): Payer: BLUE CROSS/BLUE SHIELD

## 2016-01-20 ENCOUNTER — Encounter (HOSPITAL_COMMUNITY): Payer: BLUE CROSS/BLUE SHIELD

## 2016-01-22 ENCOUNTER — Encounter (HOSPITAL_COMMUNITY): Payer: BLUE CROSS/BLUE SHIELD

## 2016-01-24 ENCOUNTER — Encounter (HOSPITAL_COMMUNITY): Payer: BLUE CROSS/BLUE SHIELD

## 2016-01-27 ENCOUNTER — Encounter (HOSPITAL_COMMUNITY): Payer: BLUE CROSS/BLUE SHIELD

## 2016-01-29 ENCOUNTER — Encounter (HOSPITAL_COMMUNITY): Payer: BLUE CROSS/BLUE SHIELD

## 2016-01-31 ENCOUNTER — Encounter (HOSPITAL_COMMUNITY): Payer: BLUE CROSS/BLUE SHIELD

## 2016-02-03 ENCOUNTER — Encounter (HOSPITAL_COMMUNITY): Payer: BLUE CROSS/BLUE SHIELD

## 2016-02-05 ENCOUNTER — Encounter (HOSPITAL_COMMUNITY): Payer: BLUE CROSS/BLUE SHIELD

## 2016-02-07 ENCOUNTER — Encounter (HOSPITAL_COMMUNITY): Payer: BLUE CROSS/BLUE SHIELD

## 2016-02-10 ENCOUNTER — Encounter (HOSPITAL_COMMUNITY): Payer: BLUE CROSS/BLUE SHIELD

## 2016-02-12 ENCOUNTER — Encounter (HOSPITAL_COMMUNITY): Payer: BLUE CROSS/BLUE SHIELD

## 2016-02-14 ENCOUNTER — Encounter (HOSPITAL_COMMUNITY): Payer: BLUE CROSS/BLUE SHIELD

## 2016-02-17 ENCOUNTER — Encounter (HOSPITAL_COMMUNITY): Payer: BLUE CROSS/BLUE SHIELD

## 2016-02-19 ENCOUNTER — Encounter (HOSPITAL_COMMUNITY): Payer: BLUE CROSS/BLUE SHIELD

## 2016-02-21 ENCOUNTER — Encounter (HOSPITAL_COMMUNITY): Payer: BLUE CROSS/BLUE SHIELD

## 2016-02-24 ENCOUNTER — Encounter (HOSPITAL_COMMUNITY): Payer: BLUE CROSS/BLUE SHIELD

## 2016-05-04 ENCOUNTER — Other Ambulatory Visit: Payer: Self-pay | Admitting: Physician Assistant

## 2016-05-06 ENCOUNTER — Telehealth: Payer: Self-pay

## 2016-05-06 NOTE — Telephone Encounter (Signed)
Pt was checking on the status of med refill. He did say he wanted it refilled before his PE.  Please advise when ready  [phone]

## 2016-05-07 NOTE — Telephone Encounter (Signed)
There was a separate refill req from pharm for zolpidem and this has been done already. I called pt to notify and he verified that this was the Rx he was requesting.

## 2016-05-07 NOTE — Telephone Encounter (Signed)
I have no idea what medication is requested, and his last PE was 6 months ago, so...  There is no former phone call.  We need to know what medication.

## 2016-05-25 ENCOUNTER — Other Ambulatory Visit: Payer: Self-pay | Admitting: Physician Assistant

## 2016-05-25 ENCOUNTER — Other Ambulatory Visit: Payer: Self-pay | Admitting: Internal Medicine

## 2016-05-25 MED ORDER — LISINOPRIL 2.5 MG PO TABS: 2.5000 mg | ORAL_TABLET | Freq: Every day | ORAL | Status: DC

## 2016-05-25 MED ORDER — ATORVASTATIN CALCIUM 40 MG PO TABS: 40.0000 mg | ORAL_TABLET | Freq: Every day | ORAL | Status: DC

## 2016-05-25 NOTE — Telephone Encounter (Signed)
Rx request sent to pharmacy.  

## 2016-05-27 ENCOUNTER — Other Ambulatory Visit: Payer: Self-pay

## 2016-05-27 NOTE — Telephone Encounter (Signed)
Pt would like a refill on his zolpidem (AMBIEN) 10 MG tablet [id number]. I let him know he needs an OV for this. He feels he should be able to get another refill. Please advise at [phone]

## 2016-05-28 NOTE — Telephone Encounter (Signed)
Pt last seen 11/11/15. Stephanie please advise. Pended Rx.

## 2016-05-30 MED ORDER — ZOLPIDEM TARTRATE 10 MG PO TABS: 5.0000 mg | ORAL_TABLET | Freq: Every evening | ORAL | Status: DC | PRN

## 2016-05-30 NOTE — Telephone Encounter (Signed)
spoke with pt - advised we would fax rx for Ambien to [facility] today.

## 2016-06-01 NOTE — Telephone Encounter (Signed)
Faxed

## 2016-06-29 ENCOUNTER — Encounter: Payer: Self-pay | Admitting: Internal Medicine

## 2016-06-29 ENCOUNTER — Ambulatory Visit (INDEPENDENT_AMBULATORY_CARE_PROVIDER_SITE_OTHER): Payer: BLUE CROSS/BLUE SHIELD | Admitting: Internal Medicine

## 2016-06-29 VITALS — BP 103/70 | HR 63 | Ht 70.0 in | Wt 140.2 lb

## 2016-06-29 DIAGNOSIS — Z01810 Encounter for preprocedural cardiovascular examination: Secondary | ICD-10-CM

## 2016-06-29 DIAGNOSIS — I255 Ischemic cardiomyopathy: Secondary | ICD-10-CM

## 2016-06-29 DIAGNOSIS — M25561 Pain in right knee: Secondary | ICD-10-CM

## 2016-06-29 DIAGNOSIS — E785 Hyperlipidemia, unspecified: Secondary | ICD-10-CM | POA: Diagnosis not present

## 2016-06-29 DIAGNOSIS — G8929 Other chronic pain: Secondary | ICD-10-CM | POA: Insufficient documentation

## 2016-06-29 NOTE — Patient Instructions (Signed)
Your physician has requested that you have an echocardiogram. Echocardiography is a painless test that uses sound waves to create images of your heart. It provides your doctor with information about the size and shape of your heart and how well your heart's chambers and valves are working. This procedure takes approximately one hour. There are no restrictions for this procedure.  You have been referred to Dr. Ronnie Derby (orthopedic surgery)  Your physician has recommended you make the following change in your medication: STOP Brilinta on October 04, 2016  Your physician wants you to follow-up in: 6 months with Dr. [name]. You will receive a reminder letter in the mail two months in advance. If you don't receive a letter, please call our office to schedule the follow-up appointment.  Your physician recommends that you return for lab work FASTING in 6 months, prior to your next appointment

## 2016-06-29 NOTE — Progress Notes (Signed)
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 He has had previous surgeries and had bad outcomes and says that he never wants to have surgery again but does appear to be in significant pain. Particularly in the left shoulder and left neck area. He has pain in the base of his neck that radiates to shoulder and down to the left side of his chest. This is somewhat the pain that he feels however sometimes when he is doing exercise or exerting himself he gets shortness of breath a little more discomfort in his chest. When it is hot outside he reports some palpitations. The symptoms sound somewhat atypical for cardiac chest pain however he is concerned that this could be cardiac chest pain. He reports that he was referred here for a stress test today, however he was referred for an evaluation for stress test.  [name] returns today for follow-up of an anterior STEMI which she had in November 2016. I last saw him in 2015 when he was referred for chest pain. He underwent an exercise stress test that same day which was negative for ischemia. He supposedly did well over the next year but then presented acutely with chest pain. He says that that chest pain had gone on for several weeks and was found to have 100% occlusion proximally of the LAD. This was stented with a drug-eluting stent and his echocardiogram showed reduced LV function with EF of 30-35%. Posthospitalization he followed up with Rosaria Ferries, PA-C, who ordered another echocardiogram and that shows that there is been a slight increase in EF up to 35-40%. He also recently had lipid testing. His total cholesterol is 93 and LDL is 46. He is still on atorvastatin 80 mg  daily. This was a new medicine during his hospitalization. He reports being compliant on aspirin and Brilinta. He denies any chest pain or worsening shortness of breath. He has significant right knee pain and significant joint destruction with significant angulation of the leg. He was scheduled to get knee replacement surgery in April but had to postpone that. I instructed that he would not be able candidate for knee surgery at least until November 2017 per  06/29/2016  [name] returns today for follow-up. He's had a couple of chest twinges since I last saw him for which he took nitroglycerin. This did not immediately report to relieve his symptoms. They persisted for about an hour. It is unlikely these are anginal symptoms. At his last office visit we decreased his Lipitor to 40 mg and will reassess his cholesterol at his next office visit. He is contemplating right knee surgery with Dr. Ronnie Derby. We will obtain a limited echo as it's been 6 months since his cardiac event to see if LV function has improved at all. He can discontinue his Brilinta as of 10/04/2016 and subsequently undergo surgery with intermediate risk.  PMHx:  Past Medical History:  Diagnosis Date  . Arthritis   . Dyslipidemia 09/2015  . Ischemic cardiomyopathy 09/2015   EF 30-35 percent by echo 10/07/2015  . STEMI (ST elevation myocardial infarction) ([place]) 10/05/2015   DES LAD, residual diagonal 90%, medical Rx    Past Surgical History:  Procedure Laterality Date  .  CARDIAC CATHETERIZATION N/A 10/05/2015   Procedure: Left Heart Cath;  Surgeon: Peter M Martinique, MD;  Location: [facility];  Service: Cardiovascular;  Laterality: N/A;  . CARDIAC CATHETERIZATION  10/05/2015   Procedure: Coronary Stent Intervention;  Surgeon: Peter M Martinique, MD;  Location: [facility];  Service: Cardiovascular;;  . FOOT SURGERY Left 10/2013   Dr. [name]  . MENISCUS REPAIR      FAMHx:  Family History  Problem Relation Age of  Onset  . Heart disease Maternal Grandfather   . Rheumatic fever Mother   . Heart disease Father     SOCHx:   reports that he quit smoking about 15 years ago. His smoking use included Cigarettes. He quit after 15.00 years of use. He has never used smokeless tobacco. He reports that he drinks alcohol. He reports that he does not use drugs.  ALLERGIES:  No Known Allergies  ROS: Pertinent items noted in HPI and remainder of comprehensive ROS otherwise negative.  HOME MEDS: Current Outpatient Prescriptions  Medication Sig Dispense Refill  . acetaminophen (TYLENOL) 325 MG tablet Take 2 tablets (650 mg total) by mouth every 4 (four) hours as needed for headache or mild pain.    Marland Kitchen aspirin 81 MG chewable tablet Chew 1 tablet (81 mg total) by mouth daily.    Marland Kitchen atorvastatin (LIPITOR) 40 MG tablet Take 1 tablet (40 mg total) by mouth daily at 6 PM. 30 tablet 6  . gabapentin (NEURONTIN) 300 MG capsule TAKE 1 CAPSULE AT MIDDAY AND 3 CAPSULES AT BEDTIME 120 capsule 11  . lisinopril (PRINIVIL,ZESTRIL) 2.5 MG tablet Take 1 tablet (2.5 mg total) by mouth daily. 30 tablet 6  . Multiple Vitamin (MULTIVITAMIN) tablet Take 1 tablet by mouth daily.    . nitroGLYCERIN (NITROSTAT) 0.4 MG SL tablet Place 1 tablet (0.4 mg total) under the tongue every 5 (five) minutes x 3 doses as needed for chest pain. 25 tablet 2  . ticagrelor (BRILINTA) 90 MG TABS tablet Take 1 tablet (90 mg total) by mouth 2 (two) times daily. 60 tablet 11  . zolpidem (AMBIEN) 10 MG tablet Take 0.5-1 tablets (5-10 mg total) by mouth at bedtime as needed. for sleep 30 tablet 0   No current facility-administered medications for this visit.     LABS/IMAGING: No results found for this or any previous visit (from the past 48 hour(s)). No results found.  VITALS: BP 103/70   Pulse 63   Ht 5\' 10"  (1.778 m)   Wt 140 lb 3.2 oz (63.6 kg)   BMI 20.12 kg/m   EXAM: General appearance: alert and no distress Neck: no carotid bruit and no  JVD Lungs: clear to auscultation bilaterally Heart: regular rate and rhythm, S1, S2 normal, no murmur, click, rub or gallop Abdomen: soft, non-tender; bowel sounds normal; no masses,  no organomegaly Extremities: left shoulder droop, walks with a limp on the right knee Pulses: 2+ and symmetric Skin: Skin color, texture, turgor normal. No rashes or lesions Neurologic: Grossly normal Psych: Somewhat of a flat mood, affect  EKG: Normal sinus rhythm at 63, anterolateral infarct pattern  ASSESSMENT: 1. Coronary artery disease status post anterior STEMI in 09/2015 (s/p DES to proximal LAD) 2. Ischemic cardiomyopathy EF 35-40% 3. Low normal blood pressure 4. Significant right knee OA   PLAN: 1.   Mr. Wick is doing fairly well. He continues to have knee problems. We discussed rereferral to Dr. Ronnie Derby for possible knee surgery in November. He cannot  stop his Brilinta until 10/04/2016. We'll reassess his EF by echo. He continues to have low normal blood pressure but tolerating low-dose lisinopril. There is no room to add beta blocker increase the dose of the ACE inhibitor. Plan follow-up in 6 months with a repeat lipid profile that time. He is intermediate risk for surgery but it is not risk prohibitive.  Pixie Casino, MD, [facility] Attending Cardiologist Delta C Hansini Clodfelter 06/29/2016, 9:25 AM

## 2016-07-09 ENCOUNTER — Telehealth: Payer: Self-pay | Admitting: Internal Medicine

## 2016-07-09 NOTE — Telephone Encounter (Signed)
[facility] [place]  1. Type of surgery: a major orthopaedic procedure 2. Date of surgery: in the near future 3. Surgeon: Dr. Alisa Graff, PA-C 4. Medications that need to be held & how long: none specified - patient takes Brilinta & aspirin 5. Fax and/or Phone: (p) [phone]  (f) [phone]  Patient had stent placed 10/05/2015  BMI: 20.12 HgA1C: not followed by cardiology

## 2016-07-10 NOTE — Telephone Encounter (Signed)
Cannot stop Brillinta until after 10/04/2016 due to recent drug-eluting stent placement. Will have to defer "major orthopedic procedure" until that time.   Dr. Lemmie Evens

## 2016-07-10 NOTE — Telephone Encounter (Signed)
Clearance response routed to Dr. Ronnie Derby via [facility]

## 2016-08-04 ENCOUNTER — Ambulatory Visit (HOSPITAL_COMMUNITY): Payer: BLUE CROSS/BLUE SHIELD | Attending: Cardiology

## 2016-08-04 ENCOUNTER — Other Ambulatory Visit: Payer: Self-pay

## 2016-08-04 DIAGNOSIS — I255 Ischemic cardiomyopathy: Secondary | ICD-10-CM | POA: Diagnosis not present

## 2016-08-04 DIAGNOSIS — Z87891 Personal history of nicotine dependence: Secondary | ICD-10-CM | POA: Insufficient documentation

## 2016-08-04 DIAGNOSIS — I252 Old myocardial infarction: Secondary | ICD-10-CM | POA: Insufficient documentation

## 2016-08-04 DIAGNOSIS — Z01810 Encounter for preprocedural cardiovascular examination: Secondary | ICD-10-CM

## 2016-08-04 DIAGNOSIS — E785 Hyperlipidemia, unspecified: Secondary | ICD-10-CM | POA: Diagnosis not present

## 2016-08-04 DIAGNOSIS — I5189 Other ill-defined heart diseases: Secondary | ICD-10-CM | POA: Diagnosis not present

## 2016-08-04 MED ORDER — PERFLUTREN LIPID MICROSPHERE
1.0000 mL | INTRAVENOUS | Status: AC | PRN
  Administered 2016-08-04: 2 mL via INTRAVENOUS

## 2016-08-10 ENCOUNTER — Telehealth: Payer: Self-pay | Admitting: *Deleted

## 2016-08-10 NOTE — Telephone Encounter (Signed)
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 He has an ECG that was done yesterday and he is going to bring it by the office or fax it to [place]. He reports this discomfort is not as bad as the pain with his MI. Follow up scheduled, pt wants to know what dr hilty thinks. Will forward for dr hilty's review and advise.

## 2016-08-12 NOTE — Telephone Encounter (Signed)
Spoke with pt, aware of dr hilty's recommendations.  

## 2016-08-12 NOTE — Telephone Encounter (Signed)
Will discuss at follow-up. If pain returns, would advise presentation to the ER for evaluation.  Dr. Lemmie Evens

## 2016-08-13 ENCOUNTER — Other Ambulatory Visit: Payer: Self-pay | Admitting: Orthopedic Surgery

## 2016-08-17 ENCOUNTER — Other Ambulatory Visit: Payer: Self-pay | Admitting: Cardiology

## 2016-09-09 ENCOUNTER — Encounter: Payer: Self-pay | Admitting: Internal Medicine

## 2016-09-09 ENCOUNTER — Ambulatory Visit (INDEPENDENT_AMBULATORY_CARE_PROVIDER_SITE_OTHER): Payer: BLUE CROSS/BLUE SHIELD | Admitting: Internal Medicine

## 2016-09-09 VITALS — BP 104/73 | HR 62 | Ht 69.0 in | Wt 138.8 lb

## 2016-09-09 DIAGNOSIS — G8929 Other chronic pain: Secondary | ICD-10-CM

## 2016-09-09 DIAGNOSIS — Z01810 Encounter for preprocedural cardiovascular examination: Secondary | ICD-10-CM | POA: Diagnosis not present

## 2016-09-09 DIAGNOSIS — M25561 Pain in right knee: Secondary | ICD-10-CM | POA: Diagnosis not present

## 2016-09-09 DIAGNOSIS — I255 Ischemic cardiomyopathy: Secondary | ICD-10-CM

## 2016-09-09 DIAGNOSIS — I2510 Atherosclerotic heart disease of native coronary artery without angina pectoris: Secondary | ICD-10-CM

## 2016-09-09 DIAGNOSIS — Z9861 Coronary angioplasty status: Secondary | ICD-10-CM

## 2016-09-09 NOTE — Progress Notes (Signed)
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 He has had previous surgeries and had bad outcomes and says that he never wants to have surgery again but does appear to be in significant pain. Particularly in the left shoulder and left neck area. He has pain in the base of his neck that radiates to shoulder and down to the left side of his chest. This is somewhat the pain that he feels however sometimes when he is doing exercise or exerting himself he gets shortness of breath a little more discomfort in his chest. When it is hot outside he reports some palpitations. The symptoms sound somewhat atypical for cardiac chest pain however he is concerned that this could be cardiac chest pain. He reports that he was referred here for a stress test today, however he was referred for an evaluation for stress test.  Joseph Riddle returns today for follow-up of an anterior STEMI which she had in November 2016. I last saw him in 2015 when he was referred for chest pain. He underwent an exercise stress test that same day which was negative for ischemia. He supposedly did well over the next year but then presented acutely with chest pain. He says that that chest pain had gone on for several weeks and was found to have 100% occlusion proximally of the LAD. This was stented with a drug-eluting stent and his echocardiogram showed reduced LV function with EF of 30-35%. Posthospitalization he followed up with Rosaria Ferries, PA-C, who ordered another echocardiogram and that shows that there is been a slight increase in EF up to 35-40%. He also recently had lipid testing. His total cholesterol is 93 and LDL is 46. He is still on  atorvastatin 80 mg daily. This was a new medicine during his hospitalization. He reports being compliant on aspirin and Brilinta. He denies any chest pain or worsening shortness of breath. He has significant right knee pain and significant joint destruction with significant angulation of the leg. He was scheduled to get knee replacement surgery in April but had to postpone that. I instructed that he would not be able candidate for knee surgery at least until November 2017 per  06/29/2016  Joseph Riddle returns today for follow-up. He's had a couple of chest twinges since I last saw him for which he took nitroglycerin. This did not immediately report to relieve his symptoms. They persisted for about an hour. It is unlikely these are anginal symptoms. At his last office visit we decreased his Lipitor to 40 mg and will reassess his cholesterol at his next office visit. He is contemplating right knee surgery with Dr. Ronnie Derby. We will obtain a limited echo as it's been 6 months since his cardiac event to see if LV function has improved at all. He can discontinue his Brilinta as of 10/04/2016 and subsequently undergo surgery with intermediate risk.  09/09/2016  Joseph Riddle was seen today for follow-up of his echo. This shows no change in LV function with EF around 35-40%. Given his hypotension with blood pressure in the 90s to low 100s, to been difficult to add additional medication for improved LV function. At this point we again discussed discontinuing his Brilinta as  of 10/04/2016. I felt that he could undergo surgery. When I told him this, however he seemed quite disappointed. He said "I was hoping you might say that I was not healthy enough for surgery". I did tell him that he could likely be at intermediate risk for surgery which would be associated with about a 5% chance of adverse events such as perioperative MI, heart failure or arrhythmia. He did seem to have some ambivalence about surgery and after discussing it  I mentioned that there would was forcing him to have surgery, but he does seem very anxious about that as well as his cardiac condition in general. He still reports significant fatigue.  PMHx:  Past Medical History:  Diagnosis Date  . Arthritis   . Dyslipidemia 09/2015  . Ischemic cardiomyopathy 09/2015   EF 30-35 percent by echo 10/07/2015  . STEMI (ST elevation myocardial infarction) ([place]) 10/05/2015   DES LAD, residual diagonal 90%, medical Rx    Past Surgical History:  Procedure Laterality Date  . CARDIAC CATHETERIZATION N/A 10/05/2015   Procedure: Left Heart Cath;  Surgeon: Peter M Martinique, MD;  Location: [facility];  Service: Cardiovascular;  Laterality: N/A;  . CARDIAC CATHETERIZATION  10/05/2015   Procedure: Coronary Stent Intervention;  Surgeon: Peter M Martinique, MD;  Location: [facility];  Service: Cardiovascular;;  . FOOT SURGERY Left 10/2013   Dr. [name]  . MENISCUS REPAIR      FAMHx:  Family History  Problem Relation Age of Onset  . Heart disease Maternal Grandfather   . Rheumatic fever Mother   . Heart disease Father     SOCHx:   reports that he quit smoking about 16 years ago. His smoking use included Cigarettes. He quit after 15.00 years of use. He has never used smokeless tobacco. He reports that he drinks alcohol. He reports that he does not use drugs.  ALLERGIES:  No Known Allergies  ROS: Pertinent items noted in HPI and remainder of comprehensive ROS otherwise negative.  HOME MEDS: Current Outpatient Prescriptions  Medication Sig Dispense Refill  . acetaminophen (TYLENOL) 325 MG tablet Take 2 tablets (650 mg total) by mouth every 4 (four) hours as needed for headache or mild pain.    Marland Kitchen aspirin 81 MG chewable tablet Chew 1 tablet (81 mg total) by mouth daily.    Marland Kitchen atorvastatin (LIPITOR) 40 MG tablet Take 1 tablet (40 mg total) by mouth daily at 6 PM. 30 tablet 6  . BRILINTA 90 MG TABS tablet TAKE 1 TABLET (90 MG TOTAL) BY MOUTH 2  (TWO) TIMES DAILY. 60 tablet 2  . doxycycline (VIBRAMYCIN) 50 MG capsule Take 50 mg by mouth daily.  3  . gabapentin (NEURONTIN) 300 MG capsule Take 300 mg by mouth each afternoon. Take 900 mg by mouth at bedtime.  11  . lisinopril (PRINIVIL,ZESTRIL) 2.5 MG tablet Take 1 tablet (2.5 mg total) by mouth daily. 30 tablet 6  . Multiple Vitamin (MULTIVITAMIN) tablet Take 1 tablet by mouth daily.    . nitroGLYCERIN (NITROSTAT) 0.4 MG SL tablet Place 1 tablet (0.4 mg total) under the tongue every 5 (five) minutes x 3 doses as needed for chest pain. 25 tablet 2  . tizanidine (ZANAFLEX) 2 MG capsule Take 2 mg by mouth 3 (three) times daily as needed for muscle spasms.  0  . zolpidem (AMBIEN) 10 MG tablet Take 0.5-1 tablets (5-10 mg total) by mouth at bedtime as needed. for sleep 30 tablet 0  No current facility-administered medications for this visit.     LABS/IMAGING: No results found for this or any previous visit (from the past 48 hour(s)). No results found.  VITALS: BP 104/73   Pulse 62   Ht 5\' 9"  (1.753 m)   Wt 138 lb 12.8 oz (63 kg)   BMI 20.50 kg/m   EXAM: General appearance: alert and no distress Neck: no carotid bruit and no JVD Lungs: clear to auscultation bilaterally Heart: regular rate and rhythm, S1, S2 normal, no murmur, click, rub or gallop Abdomen: soft, non-tender; bowel sounds normal; no masses,  no organomegaly Extremities: left shoulder droop, walks with a limp on the right knee Pulses: 2+ and symmetric Skin: Skin color, texture, turgor normal. No rashes or lesions Neurologic: Grossly normal Psych: Somewhat of a flat mood, affect  EKG: Normal sinus rhythm at 62, anterolateral infarct pattern  ASSESSMENT: 1. Coronary artery disease status post anterior STEMI in 09/2015 (s/p DES to proximal LAD) 2. Ischemic cardiomyopathy EF 35-40% 3. Low normal blood pressure 4. Significant right knee OA 5. Intermediate risk for upcoming knee surgery   PLAN: 1.   Joseph Riddle  can stop Brilinta permanently on 10/05/2015. He will also need to hold aspirin after that date until after surgery and then can restart it. His EF is essentially not improved. It may not be much better given his anterior ST elevation MI. Unfortunately I do not feel that we can further up titrate his medication due to low blood pressure. He says is blood pressure often is in the 90s at home. He continues to be fatigued and gets short of breath with minimal activity. I would recommend a referral to our advanced heart failure clinic for another opinion to see if additional heart failure medications could be used to help his LV function. Again, I think he could undergo knee surgery if he chooses to at an intermediate risk. He is scheduled tentatively for surgery on November 20.   Follow-up with me in 3 months.   Pixie Casino, MD, [facility] Attending Cardiologist [place] 09/09/2016, 6:46 PM

## 2016-09-09 NOTE — Patient Instructions (Addendum)
Medication Instructions:  Your physician recommends that you continue on your current medications as directed. Please refer to the Current Medication list given to you today.  Labwork: None   Testing/Procedures: None    Follow-Up: Your physician recommends that you schedule a follow-up appointment in: 3 months with Dr [name].  Any Other Special Instructions Will Be Listed Below (If Applicable).  You have been referred to Heart Failure Clinic in [place]    If you need a refill on your cardiac medications before your next appointment, please call your pharmacy.

## 2016-10-01 NOTE — Pre-Procedure Instructions (Signed)
JOSAIAH SHEILS  10/01/2016      [id number] [place], [place] - [address] [place] [address] Phone: [phone] Fax: [phone]    Your procedure is scheduled on November 20  Report to [facility] at [email or web address].  Call this number if you have problems the morning of surgery:  [phone]   Remember:  Do not eat food or drink liquids after midnight.   Take these medicines the morning of surgery with A SIP OF WATER acetaminophen (TYLENOL), gabapentin (NEURONTIN), nitroGLYCERIN (NITROSTAT) if needed, tizanidine (ZANAFLEX)  7 days prior to surgery STOP taking any Aspirin, Aleve, Naproxen, Ibuprofen, Motrin, Advil, Goody's, BC's, all herbal medications, fish oil, and all vitamins     Do not wear jewelry  Do not wear lotions, powders, or cologne, or deoderant.  Men may shave face and neck.  Do not bring valuables to the hospital.  [facility] is not responsible for any belongings or valuables.  Contacts, dentures or bridgework may not be worn into surgery.  Leave your suitcase in the car.  After surgery it may be brought to your room.  For patients admitted to the hospital, discharge time will be determined by your treatment team.  Patients discharged the day of surgery will not be allowed to drive home.    Special instructions:   [facility]- Preparing For Surgery  Before surgery, you can play an important role. Because skin is not sterile, your skin needs to be as free of germs as possible. You can reduce the number of germs on your skin by washing with CHG (chlorahexidine gluconate) Soap before surgery.  CHG is an antiseptic cleaner which kills germs and bonds with the skin to continue killing germs even after washing.  Please do not use if you have an allergy to CHG or antibacterial soaps. If your skin becomes reddened/irritated stop using the CHG.  Do not shave (including legs and underarms) for at least 48 hours  prior to first CHG shower. It is OK to shave your face.  Please follow these instructions carefully.   1. Shower the NIGHT BEFORE SURGERY and the MORNING OF SURGERY with CHG.   2. If you chose to wash your hair, wash your hair first as usual with your normal shampoo.  3. After you shampoo, rinse your hair and body thoroughly to remove the shampoo.  4. Use CHG as you would any other liquid soap. You can apply CHG directly to the skin and wash gently with a scrungie or a clean washcloth.   5. Apply the CHG Soap to your body ONLY FROM THE NECK DOWN.  Do not use on open wounds or open sores. Avoid contact with your eyes, ears, mouth and genitals (private parts). Wash genitals (private parts) with your normal soap.  6. Wash thoroughly, paying special attention to the area where your surgery will be performed.  7. Thoroughly rinse your body with warm water from the neck down.  8. DO NOT shower/wash with your normal soap after using and rinsing off the CHG Soap.  9. Pat yourself dry with a CLEAN TOWEL.   10. Wear CLEAN PAJAMAS   11. Place CLEAN SHEETS on your bed the night of your first shower and DO NOT SLEEP WITH PETS.    Day of Surgery: Do not apply any deodorants/lotions. Please wear clean clothes to the hospital/surgery center.      Please read over the following fact  sheets that you were given.

## 2016-10-02 ENCOUNTER — Telehealth: Payer: Self-pay | Admitting: Internal Medicine

## 2016-10-02 ENCOUNTER — Encounter (HOSPITAL_COMMUNITY)
Admission: RE | Admit: 2016-10-02 | Discharge: 2016-10-02 | Disposition: A | Discharge status: Home / Self Care | Payer: BLUE CROSS/BLUE SHIELD | Source: Ambulatory Visit | Attending: Orthopedic Surgery | Admitting: Orthopedic Surgery

## 2016-10-02 ENCOUNTER — Encounter (HOSPITAL_COMMUNITY): Payer: Self-pay

## 2016-10-02 ENCOUNTER — Ambulatory Visit (HOSPITAL_COMMUNITY)
Admission: RE | Admit: 2016-10-02 | Discharge: 2016-10-02 | Disposition: A | Discharge status: Home / Self Care | Payer: BLUE CROSS/BLUE SHIELD | Source: Ambulatory Visit | Attending: Cardiology | Admitting: Cardiology

## 2016-10-02 VITALS — BP 102/78 | HR 57 | Ht 69.5 in | Wt 142.0 lb

## 2016-10-02 DIAGNOSIS — I2510 Atherosclerotic heart disease of native coronary artery without angina pectoris: Secondary | ICD-10-CM

## 2016-10-02 DIAGNOSIS — Z01812 Encounter for preprocedural laboratory examination: Secondary | ICD-10-CM | POA: Insufficient documentation

## 2016-10-02 DIAGNOSIS — Z01810 Encounter for preprocedural cardiovascular examination: Secondary | ICD-10-CM | POA: Diagnosis not present

## 2016-10-02 DIAGNOSIS — I255 Ischemic cardiomyopathy: Secondary | ICD-10-CM | POA: Diagnosis not present

## 2016-10-02 DIAGNOSIS — Z9861 Coronary angioplasty status: Secondary | ICD-10-CM | POA: Diagnosis not present

## 2016-10-02 HISTORY — DX: Dyspnea, unspecified: R06.00

## 2016-10-02 LAB — COMPREHENSIVE METABOLIC PANEL
ALT: 30 U/L (ref 17–63)
ANION GAP: 6 (ref 5–15)
AST: 35 U/L (ref 15–41)
Albumin: 4 g/dL (ref 3.5–5.0)
Alkaline Phosphatase: 55 U/L (ref 38–126)
BUN: 14 mg/dL (ref 6–20)
CHLORIDE: 103 mmol/L (ref 101–111)
CO2: 29 mmol/L (ref 22–32)
Calcium: 9.3 mg/dL (ref 8.9–10.3)
Creatinine, Ser: 1 mg/dL (ref 0.61–1.24)
GFR calc Af Amer: >60 mL/min (ref >60)
GFR calc non Af Amer: >60 mL/min (ref >60)
Glucose, Bld: 95 mg/dL (ref 65–99)
Potassium: 4 mmol/L (ref 3.5–5.1)
SODIUM: 138 mmol/L (ref 135–145)
Total Bilirubin: 0.7 mg/dL (ref 0.3–1.2)
Total Protein: 6.1 g/dL — ABNORMAL LOW (ref 6.5–8.1)

## 2016-10-02 LAB — CBC WITH DIFFERENTIAL/PLATELET
Basophils Absolute: 0 10*3/uL (ref 0.0–0.1)
Basophils Relative: 1 %
EOS ABS: 0.3 10*3/uL (ref 0.0–0.7)
EOS PCT: 5 %
HCT: 41.5 % (ref 39.0–52.0)
Hemoglobin: 14.4 g/dL (ref 13.0–17.0)
LYMPHS ABS: 1.9 10*3/uL (ref 0.7–4.0)
Lymphocytes Relative: 29 %
MCH: 31.5 pg (ref 26.0–34.0)
MCHC: 34.7 g/dL (ref 30.0–36.0)
MCV: 90.8 fL (ref 78.0–100.0)
MONOS PCT: 8 %
Monocytes Absolute: 0.5 10*3/uL (ref 0.1–1.0)
Neutro Abs: 3.8 10*3/uL (ref 1.7–7.7)
Neutrophils Relative %: 57 %
PLATELETS: 242 10*3/uL (ref 150–400)
RBC: 4.57 MIL/uL (ref 4.22–5.81)
RDW: 12.2 % (ref 11.5–15.5)
WBC: 6.5 10*3/uL (ref 4.0–10.5)

## 2016-10-02 LAB — SURGICAL PCR SCREEN
MRSA, PCR: NEGATIVE
STAPHYLOCOCCUS AUREUS: NEGATIVE

## 2016-10-02 MED ORDER — TICAGRELOR 90 MG PO TABS: 90.0000 mg | ORAL_TABLET | Freq: Two times a day (BID) | ORAL | 0 refills | Status: DC

## 2016-10-02 MED ORDER — CLOPIDOGREL BISULFATE 75 MG PO TABS: 75.0000 mg | ORAL_TABLET | Freq: Every day | ORAL | 3 refills | Status: DC

## 2016-10-02 MED ORDER — CARVEDILOL 3.125 MG PO TABS: 3.1250 mg | ORAL_TABLET | Freq: Two times a day (BID) | ORAL | 3 refills | Status: DC

## 2016-10-02 NOTE — Telephone Encounter (Signed)
Spoke with Pre-admit for pt's [facility] that are calling to ask when pt should stop ASA. Per Dr Lysbeth Penner note:   Joseph Riddle can stop Brilinta permanently on 10/05/2015. He will also need to hold aspirin after that date until after surgery and then can restart it. Informed pre-op and the said ok.

## 2016-10-02 NOTE — Progress Notes (Signed)
PCP - Gwenlyn Perking Cardiologist - [place]  Chest x-ray - not needed EKG - 08/09/16 Stress Test - 2015 ECHO - 08/04/16 Cardiac Cath - 10/05/15  Will send to anesthesia for review of cardiac history and ekg  Patient is to stop brilinta and aspirin 10/04/16 per Dr [name]  Patient denies shortness of breath, fever, cough and chest pain at PAT appointment

## 2016-10-02 NOTE — Patient Instructions (Addendum)
STOP Brilinta 10/11/2016.  START Plavix 75 mg tablet once daily after surgery.  START Carvedilol (Coreg) 3.125 mg tablet as follows: Take 1.2 tablet twice daily for 4 days. Then increase to 1 whole tablet twice daily.  Follow up 6 weeks with Dr. Aundra Dubin.  Do the following things EVERYDAY: 1) Weigh yourself in the morning before breakfast. Write it down and keep it in a log. 2) Take your medicines as prescribed 3) Eat low salt foods-Limit salt (sodium) to 2000 mg per day.  4) Stay as active as you can everyday 5) Limit all fluids for the day to less than 2 liters

## 2016-10-04 NOTE — Progress Notes (Signed)
PCP: Gwenlyn Perking Cardiology: Dr. [name] HF Cardiology: Dr. Aundra Dubin  60 yo with history of CAD s/p anterior MI in 11/16 and ischemic cardiomyopathy presents for CHF clinic evaluation.  Patient was in good health until 11/16.  At that time, he had anterior STEMI treated with DES to proximal LAD.  Echo at the time showed EF 30-35%.  Since then, he has had 2 further echoes, both with EF 35-40%, most recently in 9/17.  He has severe OA left knee and is planned for left TKR with Dr Ronnie Derby in within the next month.   He feels like he has significantly "less energy" than prior to MI.  He is not short of breath walking on flat ground but says that he "wears out" easier than in the past.  He can climb a flight of stairs without problems.  He is short of breath with heavy exertion => carrying a heavy load, trying to run, etc.  He is also significantly limited by his left knee pain.  Low BP has limited medication titration.  No lightheadedness, no orthostatic symptoms.  No palpitations.   ECG (9/17): NSR, old anterior MI  Labs (2/17): LDL 46 Labs (11/17): K 4, creatinine 1.0, LFTs normal  PMH: 1. Hyperlipidemia 2. OA: right knee.  3. CAD: Anterior STEMI 11/16.  Totally occluded LAD on cath => DES to proximal LAD.   4. Chronic systolic CHF: Ischemic cardiomyopathy. - 11/16 echo: EF 30-35%  - 2/17 echo: EF 35-40% with regional WMAs.  - 9/17 echo: EF 35-40%, anteroseptal/apical akinesis.  SH: Lives in [place], works as Optometrist, married, quit smoking > 15 years ago, occasional ETOH.   FH: No premature CAD  ROS: All systems reviewed and negative except as per HPI.   Current Outpatient Prescriptions  Medication Sig Dispense Refill  . acetaminophen (TYLENOL) 325 MG tablet Take 2 tablets (650 mg total) by mouth every 4 (four) hours as needed for headache or mild pain.    Marland Kitchen aspirin 81 MG chewable tablet Chew 1 tablet (81 mg total) by mouth daily.    Marland Kitchen atorvastatin (LIPITOR) 40 MG tablet Take 1 tablet (40  mg total) by mouth daily at 6 PM. 30 tablet 6  . Coenzyme Q10 (COQ10 PO) Take 1 tablet by mouth daily.    Marland Kitchen doxycycline (VIBRAMYCIN) 50 MG capsule Take 50 mg by mouth 2 (two) times daily as needed (for sinus pressure).   3  . gabapentin (NEURONTIN) 300 MG capsule Take 300-900 mg by mouth 2 (two) times daily. Take 300 mg by mouth each afternoon. Take 900 mg by mouth at bedtime.  11  . lisinopril (PRINIVIL,ZESTRIL) 2.5 MG tablet Take 1 tablet (2.5 mg total) by mouth daily. 30 tablet 6  . Multiple Vitamin (MULTIVITAMIN) tablet Take 1 tablet by mouth daily.    . nitroGLYCERIN (NITROSTAT) 0.4 MG SL tablet Place 1 tablet (0.4 mg total) under the tongue every 5 (five) minutes x 3 doses as needed for chest pain. 25 tablet 2  . Omega-3 Fatty Acids (FISH OIL PO) Take 1,200 mg by mouth daily.    . ticagrelor (BRILINTA) 90 MG TABS tablet Take 1 tablet (90 mg total) by mouth 2 (two) times daily. Through 10/04/16 60 tablet 0  . zolpidem (AMBIEN) 10 MG tablet Take 10 mg by mouth at bedtime as needed for sleep.    . carvedilol (COREG) 3.125 MG tablet Take 1 tablet (3.125 mg total) by mouth 2 (two) times daily. 60 tablet 3  . [START ON  10/12/2016] clopidogrel (PLAVIX) 75 MG tablet Take 1 tablet (75 mg total) by mouth daily. 30 tablet 3  . tizanidine (ZANAFLEX) 2 MG capsule Take 2 mg by mouth 3 (three) times daily as needed for muscle spasms.  0   No current facility-administered medications for this encounter.    BP 102/78 (BP Location: Left Arm, Patient Position: Sitting, Cuff Size: Normal)   Pulse (!) 57   Ht 5' 9.5" (1.765 m)   Wt 142 lb (64.4 kg)   SpO2 100%   BMI 20.67 kg/m  General: NAD Neck: No JVD, no thyromegaly or thyroid nodule.  Lungs: Clear to auscultation bilaterally with normal respiratory effort. CV: Nondisplaced PMI.  Heart regular S1/S2, no S3/S4, no murmur.  No peripheral edema.  No carotid bruit.  Normal pedal pulses.  Abdomen: Soft, nontender, no hepatosplenomegaly, no distention.   Skin: Intact without lesions or rashes.  Neurologic: Alert and oriented x 3.  Psych: Normal affect. Extremities: No clubbing or cyanosis.  HEENT: Normal.   Assessment/Plan: 1. CAD: s/p anterior MI 11/16 with DES to proximal LAD.  No recurrent chest pain.   - He will continue ASA 81 and atorvastatin 40 daily.  - DAPT score = 2.  He will stop Brilinta prior to his knee surgery.  After surgery, would favor starting Plavix 75 mg daily long-term based on DAPT score and no history of bleeding.  2. Chronic systolic CHF:  Echo 0000000 with EF 35-40%.  He has been in this range since his MI.  Ischemic cardiomyopathy.  He is not volume overloaded on exam.  Low BP has limited medication titration, though he denies lightheadedness.  Main issue has been increased fatigue and dyspnea with heavy exertion since MI.  - Continue lisinopril 2.5 mg daily.  - Add Coreg 3.125 mg 1/2 tablet bid x 4 days.  If he tolerates this without lightheadedness, increase to 3.125 mg bid after 4 days.  - Would envision eventually adding low dose spironolactone in the evening.   - EF just out of range for ICD and QRS not wide.   - I would like him to do a CPX.  Will have him get his knee replaced first and will plan CPX when he is stable post-TKR.   3. Pre-operative evaluation: As Dr [name] mentions, he is at intermediate risk for post-op complications, but knee pain is quite limiting.  He will be a year out from PCI when he undergoes surgery.  Will hold Brilinta prior. As above, I am going to try to get him on low dose beta blocker.   Loralie Champagne 10/04/2016

## 2016-10-05 NOTE — Progress Notes (Signed)
Anesthesia Chart Review:  Pt is a 60 year old male scheduled for R total knee arthroplasty on 10/12/2016 with Vickey Huger, MD.   - Cardiologist is Lyman Bishop, MD, who cleared pt for surgery at intermediate risk at last office visit 09/09/16.   - HF cardiologist is Loralie Champagne, MD,  Who also cleared pt at last office visit 10/02/16.   PMH includes:  CAD (STEMI, DES to LAD 10/05/15), ischemic cardiomyopathy, hyperlipidemia.  Former smoker. BMI 21  Medications include: ASA, lipitor, carvedilol, plavix, lisinopril, brilinta.  Pt to stop brilinta 10/04/16.    Preoperative labs reviewed.    EKG 08/09/16: NSR, old anterior MI  Echo 08/04/16:  - Left ventricle: The cavity size was normal. Wall thickness was normal. Systolic function was moderately reduced. The estimated ejection fraction was in the range of 35% to 40%. There is hypokinesis of the mid-apicalanteroseptal and apical myocardium. Doppler parameters are consistent with abnormal left ventricular relaxation (grade 1 diastolic dysfunction). - Impressions: Definity used; hypokinesis of the distal anteroseptal and apical wall with overall moderalely reduced LV function; grade 1 diastolic dysfunction.  Cardiac cath 10/05/15:   Prox RCA to Mid RCA lesion, 10% stenosed.  There is moderate left ventricular systolic dysfunction.  Prox LAD lesion, 100% stenosed. Post intervention, there is a 0% residual stenosis.  Ost 1st Diag lesion, 90% stenosed. 1. Single vessel occlusive CAD 2. Moderate LV dysfunction 3. Successful stenting of the proximal LAD with a DES. There is residual 90% stenosis at the origin of the first diagonal but there is TIMI 3 flow.  If no changes, I anticipate pt can proceed with surgery as scheduled.   Willeen Cass, FNP-BC [facility]/Anesthesiology Phone: [phone] 10/05/2016 1:32 PM

## 2016-10-09 MED ORDER — TRANEXAMIC ACID 1000 MG/10ML IV SOLN
1000.0000 mg | INTRAVENOUS | Status: AC
  Administered 2016-10-12: 1000 mg via INTRAVENOUS
  Filled 2016-10-09: qty 10

## 2016-10-09 MED ORDER — BUPIVACAINE LIPOSOME 1.3 % IJ SUSP
20.0000 mL | INTRAMUSCULAR | Status: AC
  Administered 2016-10-12: 20 mL
  Filled 2016-10-09: qty 20

## 2016-10-11 MED ORDER — ACETAMINOPHEN 500 MG PO TABS
1000.0000 mg | ORAL_TABLET | Freq: Once | ORAL | Status: AC
  Administered 2016-10-12: 1000 mg via ORAL
  Filled 2016-10-11: qty 2

## 2016-10-11 MED ORDER — CHLORHEXIDINE GLUCONATE 4 % EX LIQD: 60.0000 mL | Freq: Once | CUTANEOUS | Status: DC

## 2016-10-11 MED ORDER — CEFAZOLIN SODIUM-DEXTROSE 2-4 GM/100ML-% IV SOLN
2.0000 g | INTRAVENOUS | Status: AC
  Administered 2016-10-12: 2 g via INTRAVENOUS
  Filled 2016-10-11: qty 100

## 2016-10-11 MED ORDER — DEXAMETHASONE SODIUM PHOSPHATE 10 MG/ML IJ SOLN
8.0000 mg | Freq: Once | INTRAMUSCULAR | Status: AC
  Administered 2016-10-12: 10 mg via INTRAVENOUS
  Filled 2016-10-11: qty 1

## 2016-10-11 MED ORDER — SODIUM CHLORIDE 0.9 % IV SOLN: INTRAVENOUS | Status: DC

## 2016-10-11 NOTE — Anesthesia Preprocedure Evaluation (Signed)
Anesthesia Evaluation  Patient identified by MRN, date of birth, ID band Patient awake    Reviewed: Allergy & Precautions, H&P , Patient's Chart, lab work & pertinent test results  Airway Mallampati: II  TM Distance: >3 FB Neck ROM: full    Dental no notable dental hx.    Pulmonary former smoker,    Pulmonary exam normal breath sounds clear to auscultation       Cardiovascular Exercise Tolerance: Good  Rhythm:regular Rate:Normal     Neuro/Psych    GI/Hepatic   Endo/Other    Renal/GU      Musculoskeletal   Abdominal   Peds  Hematology   Anesthesia Other Findings   Reproductive/Obstetrics                             Anesthesia Physical Anesthesia Plan  ASA: II  Anesthesia Plan: Spinal   Post-op Pain Management:    Induction:   Airway Management Planned:   Additional Equipment:   Intra-op Plan:   Post-operative Plan:   Informed Consent: I have reviewed the patients History and Physical, chart, labs and discussed the procedure including the risks, benefits and alternatives for the proposed anesthesia with the patient or authorized representative who has indicated his/her understanding and acceptance.   Dental Advisory Given  Plan Discussed with: CRNA  Anesthesia Plan Comments: (Lab work confirmed with CRNA in room. Platelets okay. Discussed spinal anesthetic, and patient consents to the procedure:  included risk of possible headache,backache, failed block, allergic reaction, and nerve injury. This patient was asked if she had any questions or concerns before the procedure started. )        Anesthesia Quick Evaluation

## 2016-10-12 ENCOUNTER — Encounter (HOSPITAL_COMMUNITY)
Admission: RE | Disposition: A | Discharge status: Home / Self Care | Payer: Self-pay | Source: Ambulatory Visit | Attending: Orthopedic Surgery

## 2016-10-12 ENCOUNTER — Encounter (HOSPITAL_COMMUNITY): Payer: Self-pay | Admitting: *Deleted

## 2016-10-12 ENCOUNTER — Inpatient Hospital Stay (HOSPITAL_COMMUNITY): Payer: BLUE CROSS/BLUE SHIELD | Admitting: Emergency Medicine

## 2016-10-12 ENCOUNTER — Inpatient Hospital Stay (HOSPITAL_COMMUNITY)
Admission: RE | Admit: 2016-10-12 | Discharge: 2016-10-13 | DRG: 470 | Disposition: A | Discharge status: Home / Self Care | Payer: BLUE CROSS/BLUE SHIELD | Source: Ambulatory Visit | Attending: Orthopedic Surgery | Admitting: Orthopedic Surgery

## 2016-10-12 ENCOUNTER — Inpatient Hospital Stay (HOSPITAL_COMMUNITY): Payer: BLUE CROSS/BLUE SHIELD | Admitting: Anesthesiology

## 2016-10-12 DIAGNOSIS — I255 Ischemic cardiomyopathy: Secondary | ICD-10-CM | POA: Diagnosis present

## 2016-10-12 DIAGNOSIS — G4700 Insomnia, unspecified: Secondary | ICD-10-CM | POA: Diagnosis present

## 2016-10-12 DIAGNOSIS — M1711 Unilateral primary osteoarthritis, right knee: Principal | ICD-10-CM | POA: Diagnosis present

## 2016-10-12 DIAGNOSIS — I252 Old myocardial infarction: Secondary | ICD-10-CM | POA: Diagnosis not present

## 2016-10-12 DIAGNOSIS — I2510 Atherosclerotic heart disease of native coronary artery without angina pectoris: Secondary | ICD-10-CM | POA: Diagnosis present

## 2016-10-12 DIAGNOSIS — E785 Hyperlipidemia, unspecified: Secondary | ICD-10-CM | POA: Diagnosis present

## 2016-10-12 DIAGNOSIS — Z96659 Presence of unspecified artificial knee joint: Secondary | ICD-10-CM

## 2016-10-12 DIAGNOSIS — M25661 Stiffness of right knee, not elsewhere classified: Secondary | ICD-10-CM

## 2016-10-12 DIAGNOSIS — M5030 Other cervical disc degeneration, unspecified cervical region: Secondary | ICD-10-CM | POA: Diagnosis present

## 2016-10-12 DIAGNOSIS — R262 Difficulty in walking, not elsewhere classified: Secondary | ICD-10-CM

## 2016-10-12 DIAGNOSIS — Z87891 Personal history of nicotine dependence: Secondary | ICD-10-CM

## 2016-10-12 HISTORY — PX: TOTAL KNEE ARTHROPLASTY: SHX125

## 2016-10-12 SURGERY — ARTHROPLASTY, KNEE, TOTAL
Anesthesia: Spinal | Site: Knee | Laterality: Right

## 2016-10-12 MED ORDER — CARVEDILOL 3.125 MG PO TABS
3.1250 mg | ORAL_TABLET | Freq: Two times a day (BID) | ORAL | Status: DC
Start: 2016-10-12 — End: 2016-10-13
  Administered 2016-10-12 – 2016-10-13 (×3): 3.125 mg via ORAL
  Filled 2016-10-12 (×3): qty 1

## 2016-10-12 MED ORDER — MIDAZOLAM HCL 2 MG/2ML IJ SOLN
2.0000 mg | Freq: Once | INTRAMUSCULAR | Status: AC
  Administered 2016-10-12: 2 mg via INTRAVENOUS

## 2016-10-12 MED ORDER — BUPIVACAINE HCL (PF) 0.25 % IJ SOLN
INTRAMUSCULAR | Status: AC
  Filled 2016-10-12: qty 30

## 2016-10-12 MED ORDER — LISINOPRIL 2.5 MG PO TABS
2.5000 mg | ORAL_TABLET | Freq: Every day | ORAL | Status: DC
  Administered 2016-10-12 – 2016-10-13 (×2): 2.5 mg via ORAL
  Filled 2016-10-12 (×2): qty 1

## 2016-10-12 MED ORDER — ROPIVACAINE HCL 7.5 MG/ML IJ SOLN
INTRAMUSCULAR | Status: DC | PRN
  Administered 2016-10-12: 20 mL via PERINEURAL

## 2016-10-12 MED ORDER — METOCLOPRAMIDE HCL 5 MG/ML IJ SOLN: 5.0000 mg | Freq: Three times a day (TID) | INTRAMUSCULAR | Status: DC | PRN

## 2016-10-12 MED ORDER — DEXMEDETOMIDINE HCL IN NACL 200 MCG/50ML IV SOLN
INTRAVENOUS | Status: AC
  Filled 2016-10-12: qty 50

## 2016-10-12 MED ORDER — METHOCARBAMOL 500 MG PO TABS
500.0000 mg | ORAL_TABLET | Freq: Four times a day (QID) | ORAL | Status: DC | PRN
  Filled 2016-10-12: qty 1

## 2016-10-12 MED ORDER — MIDAZOLAM HCL 2 MG/2ML IJ SOLN
INTRAMUSCULAR | Status: AC
  Administered 2016-10-12: 2 mg via INTRAVENOUS
  Filled 2016-10-12: qty 2

## 2016-10-12 MED ORDER — PHENOL 1.4 % MT LIQD
1.0000 | OROMUCOSAL | Status: DC | PRN
Start: 2016-10-12 — End: 2016-10-13
  Administered 2016-10-12: 1 via OROMUCOSAL
  Filled 2016-10-12: qty 177

## 2016-10-12 MED ORDER — GLYCOPYRROLATE 0.2 MG/ML IJ SOLN
INTRAMUSCULAR | Status: DC | PRN
  Administered 2016-10-12: 0.3 mg via INTRAVENOUS

## 2016-10-12 MED ORDER — EPINEPHRINE PF 1 MG/ML IJ SOLN
INTRAMUSCULAR | Status: AC
  Filled 2016-10-12: qty 1

## 2016-10-12 MED ORDER — DIPHENHYDRAMINE HCL 50 MG/ML IJ SOLN
INTRAMUSCULAR | Status: DC | PRN
  Administered 2016-10-12: 25 mg via INTRAVENOUS

## 2016-10-12 MED ORDER — FENTANYL CITRATE (PF) 100 MCG/2ML IJ SOLN
INTRAMUSCULAR | Status: AC
  Filled 2016-10-12: qty 2

## 2016-10-12 MED ORDER — ONDANSETRON HCL 4 MG/2ML IJ SOLN: 4.0000 mg | Freq: Four times a day (QID) | INTRAMUSCULAR | Status: DC | PRN

## 2016-10-12 MED ORDER — CEFAZOLIN IN D5W 1 GM/50ML IV SOLN
1.0000 g | Freq: Four times a day (QID) | INTRAVENOUS | Status: AC
  Administered 2016-10-12 (×2): 1 g via INTRAVENOUS
  Filled 2016-10-12 (×4): qty 50

## 2016-10-12 MED ORDER — ZOLPIDEM TARTRATE 5 MG PO TABS
5.0000 mg | ORAL_TABLET | Freq: Every evening | ORAL | Status: DC | PRN
  Administered 2016-10-13: 5 mg via ORAL
  Filled 2016-10-12: qty 1

## 2016-10-12 MED ORDER — NITROGLYCERIN 0.4 MG SL SUBL
0.4000 mg | SUBLINGUAL_TABLET | SUBLINGUAL | Status: DC | PRN
Start: 2016-10-12 — End: 2016-10-13

## 2016-10-12 MED ORDER — CLOPIDOGREL BISULFATE 75 MG PO TABS
75.0000 mg | ORAL_TABLET | Freq: Every day | ORAL | Status: DC
  Administered 2016-10-12 – 2016-10-13 (×2): 75 mg via ORAL
  Filled 2016-10-12 (×2): qty 1

## 2016-10-12 MED ORDER — LACTATED RINGERS IV SOLN
INTRAVENOUS | Status: DC
  Administered 2016-10-12 (×2): via INTRAVENOUS

## 2016-10-12 MED ORDER — ATORVASTATIN CALCIUM 40 MG PO TABS
40.0000 mg | ORAL_TABLET | Freq: Every day | ORAL | Status: DC
  Administered 2016-10-12: 40 mg via ORAL
  Filled 2016-10-12: qty 1

## 2016-10-12 MED ORDER — GABAPENTIN 300 MG PO CAPS
300.0000 mg | ORAL_CAPSULE | Freq: Every day | ORAL | Status: DC
  Administered 2016-10-12: 300 mg via ORAL

## 2016-10-12 MED ORDER — MENTHOL 3 MG MT LOZG: 1.0000 | LOZENGE | OROMUCOSAL | Status: DC | PRN

## 2016-10-12 MED ORDER — LIDOCAINE HCL (CARDIAC) 20 MG/ML IV SOLN
INTRAVENOUS | Status: DC | PRN
  Administered 2016-10-12: 50 mg via INTRATRACHEAL

## 2016-10-12 MED ORDER — GABAPENTIN 300 MG PO CAPS
900.0000 mg | ORAL_CAPSULE | Freq: Every day | ORAL | Status: DC
  Administered 2016-10-12 – 2016-10-13 (×2): 900 mg via ORAL
  Filled 2016-10-12 (×2): qty 3

## 2016-10-12 MED ORDER — PROPOFOL 10 MG/ML IV BOLUS
INTRAVENOUS | Status: AC
  Filled 2016-10-12: qty 20

## 2016-10-12 MED ORDER — ALUM & MAG HYDROXIDE-SIMETH 200-200-20 MG/5ML PO SUSP: 30.0000 mL | ORAL | Status: DC | PRN

## 2016-10-12 MED ORDER — SODIUM CHLORIDE 0.9 % IV SOLN
INTRAVENOUS | Status: DC
  Administered 2016-10-12: 16:00:00 via INTRAVENOUS

## 2016-10-12 MED ORDER — METHOCARBAMOL 1000 MG/10ML IJ SOLN
500.0000 mg | Freq: Four times a day (QID) | INTRAMUSCULAR | Status: DC | PRN
  Filled 2016-10-12: qty 5

## 2016-10-12 MED ORDER — ASPIRIN 81 MG PO CHEW
81.0000 mg | CHEWABLE_TABLET | Freq: Two times a day (BID) | ORAL | Status: DC
  Administered 2016-10-12 – 2016-10-13 (×2): 81 mg via ORAL
  Filled 2016-10-12 (×2): qty 1

## 2016-10-12 MED ORDER — ONDANSETRON HCL 4 MG PO TABS: 4.0000 mg | ORAL_TABLET | Freq: Four times a day (QID) | ORAL | Status: DC | PRN

## 2016-10-12 MED ORDER — TICAGRELOR 90 MG PO TABS: 90.0000 mg | ORAL_TABLET | Freq: Two times a day (BID) | ORAL | Status: DC

## 2016-10-12 MED ORDER — SENNOSIDES-DOCUSATE SODIUM 8.6-50 MG PO TABS
1.0000 | ORAL_TABLET | Freq: Every evening | ORAL | Status: DC | PRN
  Administered 2016-10-12: 1 via ORAL
  Filled 2016-10-12: qty 1

## 2016-10-12 MED ORDER — OXYCODONE HCL ER 10 MG PO T12A
10.0000 mg | EXTENDED_RELEASE_TABLET | Freq: Two times a day (BID) | ORAL | Status: DC
  Administered 2016-10-12 – 2016-10-13 (×2): 10 mg via ORAL
  Filled 2016-10-12 (×2): qty 1

## 2016-10-12 MED ORDER — BISACODYL 5 MG PO TBEC: 5.0000 mg | DELAYED_RELEASE_TABLET | Freq: Every day | ORAL | Status: DC | PRN

## 2016-10-12 MED ORDER — FENTANYL CITRATE (PF) 100 MCG/2ML IJ SOLN
INTRAMUSCULAR | Status: AC
  Administered 2016-10-12: 50 ug via INTRAVENOUS
  Filled 2016-10-12: qty 2

## 2016-10-12 MED ORDER — HYDROMORPHONE HCL 2 MG/ML IJ SOLN: 1.0000 mg | INTRAMUSCULAR | Status: DC | PRN

## 2016-10-12 MED ORDER — MIDAZOLAM HCL 2 MG/2ML IJ SOLN
INTRAMUSCULAR | Status: AC
  Filled 2016-10-12: qty 2

## 2016-10-12 MED ORDER — EPINEPHRINE PF 1 MG/ML IJ SOLN
INTRAMUSCULAR | Status: DC | PRN
  Administered 2016-10-12: 1 mg

## 2016-10-12 MED ORDER — DOCUSATE SODIUM 100 MG PO CAPS
100.0000 mg | ORAL_CAPSULE | Freq: Two times a day (BID) | ORAL | Status: DC
  Administered 2016-10-12 – 2016-10-13 (×3): 100 mg via ORAL
  Filled 2016-10-12 (×3): qty 1

## 2016-10-12 MED ORDER — FLEET ENEMA 7-19 GM/118ML RE ENEM: 1.0000 | ENEMA | Freq: Once | RECTAL | Status: DC | PRN

## 2016-10-12 MED ORDER — DEXMEDETOMIDINE HCL 200 MCG/2ML IV SOLN
INTRAVENOUS | Status: DC | PRN
  Administered 2016-10-12 (×5): 8 ug via INTRAVENOUS

## 2016-10-12 MED ORDER — ACETAMINOPHEN 500 MG PO TABS
1000.0000 mg | ORAL_TABLET | Freq: Four times a day (QID) | ORAL | Status: AC
  Administered 2016-10-12 – 2016-10-13 (×4): 1000 mg via ORAL
  Filled 2016-10-12 (×3): qty 2

## 2016-10-12 MED ORDER — SODIUM CHLORIDE 0.9 % IJ SOLN
INTRAMUSCULAR | Status: DC | PRN
  Administered 2016-10-12: 20 mL via INTRAVENOUS

## 2016-10-12 MED ORDER — OXYCODONE HCL 5 MG PO TABS
5.0000 mg | ORAL_TABLET | ORAL | Status: DC | PRN
  Administered 2016-10-12 – 2016-10-13 (×2): 10 mg via ORAL
  Filled 2016-10-12 (×3): qty 2

## 2016-10-12 MED ORDER — DIPHENHYDRAMINE HCL 12.5 MG/5ML PO ELIX: 12.5000 mg | ORAL_SOLUTION | ORAL | Status: DC | PRN

## 2016-10-12 MED ORDER — SODIUM CHLORIDE 0.9 % IR SOLN
Status: DC | PRN
  Administered 2016-10-12: 3000 mL

## 2016-10-12 MED ORDER — EPHEDRINE SULFATE 50 MG/ML IJ SOLN
INTRAMUSCULAR | Status: DC | PRN
  Administered 2016-10-12: 5 mg via INTRAVENOUS
  Administered 2016-10-12 (×2): 7.5 mg via INTRAVENOUS

## 2016-10-12 MED ORDER — ACETAMINOPHEN 325 MG PO TABS: 650.0000 mg | ORAL_TABLET | Freq: Four times a day (QID) | ORAL | Status: DC | PRN

## 2016-10-12 MED ORDER — BUPIVACAINE HCL (PF) 0.5 % IJ SOLN
INTRAMUSCULAR | Status: AC
  Filled 2016-10-12: qty 30

## 2016-10-12 MED ORDER — PROPOFOL 500 MG/50ML IV EMUL
INTRAVENOUS | Status: DC | PRN
  Administered 2016-10-12: 30 ug/kg/min via INTRAVENOUS

## 2016-10-12 MED ORDER — METOCLOPRAMIDE HCL 5 MG PO TABS: 5.0000 mg | ORAL_TABLET | Freq: Three times a day (TID) | ORAL | Status: DC | PRN

## 2016-10-12 MED ORDER — FENTANYL CITRATE (PF) 100 MCG/2ML IJ SOLN
50.0000 ug | Freq: Once | INTRAMUSCULAR | Status: AC
  Administered 2016-10-12: 50 ug via INTRAVENOUS

## 2016-10-12 MED ORDER — ACETAMINOPHEN 650 MG RE SUPP: 650.0000 mg | Freq: Four times a day (QID) | RECTAL | Status: DC | PRN

## 2016-10-12 MED ORDER — DEXAMETHASONE SODIUM PHOSPHATE 10 MG/ML IJ SOLN
10.0000 mg | Freq: Once | INTRAMUSCULAR | Status: AC
  Administered 2016-10-13: 10 mg via INTRAVENOUS
  Filled 2016-10-12: qty 1

## 2016-10-12 MED ORDER — GABAPENTIN 300 MG PO CAPS: 300.0000 mg | ORAL_CAPSULE | Freq: Two times a day (BID) | ORAL | Status: DC

## 2016-10-12 MED ORDER — CARVEDILOL 3.125 MG PO TABS
3.1250 mg | ORAL_TABLET | Freq: Once | ORAL | Status: DC
  Filled 2016-10-12: qty 1

## 2016-10-12 MED ORDER — BUPIVACAINE IN DEXTROSE 0.75-8.25 % IT SOLN
INTRATHECAL | Status: DC | PRN
  Administered 2016-10-12: 19 mL via INTRATHECAL

## 2016-10-12 MED ORDER — BUPIVACAINE HCL (PF) 0.5 % IJ SOLN
INTRAMUSCULAR | Status: DC | PRN
  Administered 2016-10-12: 30 mL

## 2016-10-12 MED ORDER — HYDROMORPHONE HCL 2 MG/ML IJ SOLN: 0.2500 mg | INTRAMUSCULAR | Status: DC | PRN

## 2016-10-12 SURGICAL SUPPLY — 59 items
BANDAGE ESMARK 6X9 LF (GAUZE/BANDAGES/DRESSINGS) ×1 IMPLANT
BLADE SAGITTAL 13X1.27X60 (BLADE) ×2 IMPLANT
BLADE SAGITTAL 13X1.27X60MM (BLADE) ×1
BLADE SAW SGTL 83.5X18.5 (BLADE) ×3 IMPLANT
BLADE SURG 10 STRL SS (BLADE) ×3 IMPLANT
BNDG CMPR 9X6 STRL LF SNTH (GAUZE/BANDAGES/DRESSINGS) ×1
BNDG ESMARK 6X9 LF (GAUZE/BANDAGES/DRESSINGS) ×3
BOWL SMART MIX CTS (DISPOSABLE) ×3 IMPLANT
CAPT KNEE TOTAL 3 ×3 IMPLANT
CEMENT BONE SIMPLEX SPEEDSET (Cement) ×6 IMPLANT
CLOSURE WOUND 1/2 X4 (GAUZE/BANDAGES/DRESSINGS) ×1
COVER SURGICAL LIGHT HANDLE (MISCELLANEOUS) ×3 IMPLANT
CUFF TOURNIQUET SINGLE 34IN LL (TOURNIQUET CUFF) ×3 IMPLANT
DRAPE HALF SHEET 40X57 (DRAPES) ×3 IMPLANT
DRAPE INCISE IOBAN 66X45 STRL (DRAPES) ×6 IMPLANT
DRAPE U-SHAPE 47X51 STRL (DRAPES) ×3 IMPLANT
DRSG AQUACEL AG ADV 3.5X10 (GAUZE/BANDAGES/DRESSINGS) ×3 IMPLANT
DRSG PAD ABDOMINAL 8X10 ST (GAUZE/BANDAGES/DRESSINGS) ×3 IMPLANT
DURAPREP 26ML APPLICATOR (WOUND CARE) ×4 IMPLANT
ELECT REM PT RETURN 9FT ADLT (ELECTROSURGICAL) ×3
ELECTRODE REM PT RTRN 9FT ADLT (ELECTROSURGICAL) ×1 IMPLANT
GLOVE BIOGEL M 7.0 STRL (GLOVE) ×4 IMPLANT
GLOVE BIOGEL PI IND STRL 7.5 (GLOVE) IMPLANT
GLOVE BIOGEL PI IND STRL 8.5 (GLOVE) ×1 IMPLANT
GLOVE BIOGEL PI INDICATOR 7.5 (GLOVE) ×2
GLOVE BIOGEL PI INDICATOR 8.5 (GLOVE) ×2
GLOVE SURG ORTHO 8.0 STRL STRW (GLOVE) ×6 IMPLANT
GOWN STRL REUS W/ TWL LRG LVL3 (GOWN DISPOSABLE) ×1 IMPLANT
GOWN STRL REUS W/ TWL XL LVL3 (GOWN DISPOSABLE) ×2 IMPLANT
GOWN STRL REUS W/TWL 2XL LVL3 (GOWN DISPOSABLE) ×3 IMPLANT
GOWN STRL REUS W/TWL LRG LVL3 (GOWN DISPOSABLE) ×3
GOWN STRL REUS W/TWL XL LVL3 (GOWN DISPOSABLE) ×3
HANDPIECE INTERPULSE COAX TIP (DISPOSABLE) ×3
HOOD PEEL AWAY FACE SHEILD DIS (HOOD) ×9 IMPLANT
KIT BASIN OR (CUSTOM PROCEDURE TRAY) ×3 IMPLANT
KIT ROOM TURNOVER OR (KITS) ×3 IMPLANT
KNEE CAPITATED TOTAL 3 IMPLANT
MANIFOLD NEPTUNE II (INSTRUMENTS) ×3 IMPLANT
NEEDLE 22X1 1/2 (OR ONLY) (NEEDLE) ×6 IMPLANT
NS IRRIG 1000ML POUR BTL (IV SOLUTION) ×3 IMPLANT
PACK TOTAL JOINT (CUSTOM PROCEDURE TRAY) ×3 IMPLANT
PAD ABD 8X10 STRL (GAUZE/BANDAGES/DRESSINGS) ×2 IMPLANT
PAD ARMBOARD 7.5X6 YLW CONV (MISCELLANEOUS) ×6 IMPLANT
SET HNDPC FAN SPRY TIP SCT (DISPOSABLE) ×1 IMPLANT
STAPLER VISISTAT 35W (STAPLE) ×3 IMPLANT
STRIP CLOSURE SKIN 1/2X4 (GAUZE/BANDAGES/DRESSINGS) ×2 IMPLANT
SUCTION FRAZIER HANDLE 10FR (MISCELLANEOUS) ×2
SUCTION TUBE FRAZIER 10FR DISP (MISCELLANEOUS) IMPLANT
SUT BONE WAX W31G (SUTURE) ×3 IMPLANT
SUT VIC AB 0 CTB1 27 (SUTURE) ×6 IMPLANT
SUT VIC AB 1 CT1 27 (SUTURE) ×6
SUT VIC AB 1 CT1 27XBRD ANBCTR (SUTURE) ×2 IMPLANT
SUT VIC AB 2-0 CT1 27 (SUTURE) ×6
SUT VIC AB 2-0 CT1 TAPERPNT 27 (SUTURE) ×2 IMPLANT
SYR 20CC LL (SYRINGE) ×6 IMPLANT
TOWEL OR 17X24 6PK STRL BLUE (TOWEL DISPOSABLE) ×3 IMPLANT
TOWEL OR 17X26 10 PK STRL BLUE (TOWEL DISPOSABLE) ×3 IMPLANT
TRAY CATH 16FR W/PLASTIC CATH (SET/KITS/TRAYS/PACK) ×2 IMPLANT
WRAP KNEE MAXI GEL POST OP (GAUZE/BANDAGES/DRESSINGS) ×3 IMPLANT

## 2016-10-12 NOTE — Evaluation (Signed)
Physical Therapy Evaluation Patient Details Name: Joseph Riddle MRN: [id number] DOB: Jan 07, 1956 Today's Date: 10/12/2016   History of Present Illness  60 yo male admitted on 10/12/16 for right TKA. PMH significant for cardiac findings including CAD and multiple catheterizations.   Clinical Impression  Pt is POD 0 and moving well with therapy. Limited pt's mobility due to decreased feeling in le's from nerve block. Pt performed bed mobility without therapist assistance and performed sit to stand with min guard for safety from EOB. Pt is motivated to get home and recover quickly. 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 Pt will benefit from being seen acutely for below deficits in order to assist with return to PLOF.     Follow Up Recommendations Home health PT    Equipment Recommendations  Rolling walker with 5" wheels;3in1 (PT)    Recommendations for Other Services       Precautions / Restrictions Precautions Precautions: Knee Precaution Comments: No pillow behind knee Restrictions Weight Bearing Restrictions: Yes RLE Weight Bearing: Weight bearing as tolerated      Mobility  Bed Mobility Overal bed mobility: Needs Assistance Bed Mobility: Supine to Sit     Supine to sit: Min guard     General bed mobility comments: Min guard to EOB for safety. Pt was at EOB before clinician had told him to move.  Transfers Overall transfer level: Needs assistance Equipment used: Rolling walker (2 wheeled) Transfers: Sit to/from Stand Sit to Stand: Min guard         General transfer comment: min guard for safety from bed to RW with cues for proper hand placement  Ambulation/Gait Ambulation/Gait assistance: Min guard Ambulation Distance (Feet): 50 Feet Assistive device: Rolling walker (2 wheeled) Gait Pattern/deviations: Step-through pattern;Decreased step length - left;Decreased stance time - right;Antalgic Gait velocity: decreased Gait velocity interpretation: Below  normal speed for age/gender General Gait Details: min antalgia noted with decreased knee flexion during swing phase on RLE  Stairs            Wheelchair Mobility    Modified Rankin (Stroke Patients Only)       Balance Overall balance assessment: Needs assistance Sitting-balance support: No upper extremity supported Sitting balance-Leahy Scale: Fair     Standing balance support: Bilateral upper extremity supported Standing balance-Leahy Scale: Poor Standing balance comment: Requires UE support due to lack of feeling in LE's.                              Pertinent Vitals/Pain Pain Assessment: 0-10 Pain Score: 1  Pain Location: right knee Pain Descriptors / Indicators: Aching Pain Intervention(s): Monitored during session;Premedicated before session;Ice applied    Home Living Family/patient expects to be discharged to:: Private residence Living Arrangements: Spouse/significant other;Children Available Help at Discharge: Family;Home health Type of Home: House Home Access: Stairs to enter Entrance Stairs-Rails: Psychiatric nurse of Steps: 4-5  (front and 8 steps into back door ) Home Layout: Two level;Able to live on main level with bedroom/bathroom;Full bath on main level Home Equipment: Cane - single point;Crutches      Prior Function Level of Independence: Independent         Comments: worked full time as Engineer, maintenance (IT). Was active walking      Hand Dominance   Dominant Hand: Right    Extremity/Trunk Assessment   Upper Extremity Assessment: Defer to OT evaluation  Lower Extremity Assessment: RLE deficits/detail RLE Deficits / Details: pt with normal post op pain and weakness. At least 3/5 ankle. 3/5 knee and hip per gross functional assessment.       Communication   Communication: No difficulties  Cognition Arousal/Alertness: Awake/alert Behavior During Therapy: WFL for tasks assessed/performed Overall Cognitive  Status: Within Functional Limits for tasks assessed                      General Comments      Exercises Total Joint Exercises Ankle Circles/Pumps: AROM;Both;20 reps;Supine Quad Sets: AROM;Right;10 reps;Supine   Assessment/Plan    PT Assessment Patient needs continued PT services  PT Problem List Decreased strength;Decreased range of motion;Decreased activity tolerance;Decreased balance;Decreased mobility;Decreased knowledge of use of DME;Pain          PT Treatment Interventions DME instruction;Gait training;Functional mobility training;Stair training;Therapeutic activities;Therapeutic exercise;Balance training;Patient/family education    PT Goals (Current goals can be found in the Care Plan section)  Acute Rehab PT Goals Patient Stated Goal: to get better and get back to work PT Goal Formulation: With patient Time For Goal Achievement: 10/19/16 Potential to Achieve Goals: Good    Frequency 7X/week   Barriers to discharge        Co-evaluation               End of Session Equipment Utilized During Treatment: Gait belt Activity Tolerance: Patient tolerated treatment well Patient left: in chair;with call bell/phone within reach;with nursing/sitter in room Nurse Communication: Mobility status         Time: 1530-1601 PT Time Calculation (min) (ACUTE ONLY): 31 min   Charges:   PT Evaluation $PT Eval Low Complexity: 1 Procedure PT Treatments $Gait Training: 8-22 mins   PT G Codes:        Scheryl Marten PT, DPT  [phone]  10/12/2016, 5:01 PM

## 2016-10-12 NOTE — Anesthesia Procedure Notes (Signed)
Spinal  Patient location during procedure: OR Staffing Anesthesiologist: Lyndle Herrlich Preanesthetic Checklist Completed: patient identified, site marked, surgical consent, pre-op evaluation, timeout performed, IV checked, risks and benefits discussed and monitors and equipment checked Spinal Block Patient position: sitting Prep: DuraPrep Patient monitoring: heart rate, cardiac monitor, continuous pulse ox and blood pressure Approach: midline Location: L3-4 Injection technique: single-shot Needle Needle type: Sprotte  Needle gauge: 24 G Needle length: 9 cm Assessment Sensory level: T6 Additional Notes Spinal Dosage in OR  Bupivicaine ml       1.9 RLD x 3 min

## 2016-10-12 NOTE — Transfer of Care (Signed)
Immediate Anesthesia Transfer of Care Note  Patient: GRANT WOODRUM  Procedure(s) Performed: Procedure(s): RIGHT TOTAL KNEE ARTHROPLASTY (Right)  Patient Location: PACU  Anesthesia Type:Regional and Spinal  Level of Consciousness: awake, alert  and oriented  Airway & Oxygen Therapy: Patient Spontanous Breathing and Patient connected to nasal cannula oxygen  Post-op Assessment: Report given to RN and Post -op Vital signs reviewed and stable  Post vital signs: Reviewed and stable  Last Vitals:  Vitals:   10/12/16 0829 10/12/16 0830  BP: (!) 83/64   Pulse: (!) 56 (!) 55  Resp: 12 16  Temp:      Last Pain:  Vitals:   10/12/16 0701  TempSrc: Oral         Complications: No apparent anesthesia complications

## 2016-10-12 NOTE — Progress Notes (Signed)
Carvedilol held due to VS not meeting parameters.

## 2016-10-12 NOTE — Progress Notes (Signed)
Orthopedic Tech Progress Note Patient Details:  Joseph Riddle 13-Dec-1955 [id number]  CPM Right Knee CPM Right Knee: On Right Knee Flexion (Degrees): 90 Right Knee Extension (Degrees): 0 Additional Comments: trapeze bar patient helper   Hildred Priest 10/12/2016, 11:45 AM Viewed order from doctor's order list

## 2016-10-12 NOTE — H&P (Signed)
PINCUS BALYEAT MRN:  [id number] DOB/SEX:  [birth year]/male  CHIEF COMPLAINT:  Painful right Knee  HISTORY: Patient is a 60 y.o. male presented with a history of pain in the right knee. Onset of symptoms was gradual starting a few years ago with gradually worsening course since that time. Patient has been treated conservatively with over-the-counter NSAIDs and activity modification. Patient currently rates pain in the knee at 10 out of 10 with activity. There is pain at night.  PAST MEDICAL HISTORY: Patient Active Problem List   Diagnosis Date Noted  . Preoperative cardiovascular examination 06/29/2016  . Chronic pain of right knee 06/29/2016  . Cardiomyopathy, ischemic 10/08/2015  . Hypotension 10/08/2015  . CAD S/P LAD DES 10/05/15   . Ischemic chest pain ([place])   . Dyslipidemia   . ST elevation (STEMI) myocardial infarction involving left anterior descending coronary artery ([place]) 10/05/2015  . ST elevation myocardial infarction (STEMI) involving left anterior descending (LAD) coronary artery with complication ([place]) [id number]  . Neck pain on left side 06/19/2014  . Chest pain 06/19/2014  . Palpitations 06/19/2014  . DDD (degenerative disc disease), cervical 07/13/2012  . Degenerative joint disease involving multiple joints 03/10/2012  . Insomnia 03/10/2012   Past Medical History:  Diagnosis Date  . Arthritis   . Dyslipidemia 09/2015  . Dyspnea   . Ischemic cardiomyopathy 09/2015   EF 30-35 percent by echo 10/07/2015  . STEMI (ST elevation myocardial infarction) ([place]) 10/05/2015   DES LAD, residual diagonal 90%, medical Rx   Past Surgical History:  Procedure Laterality Date  . CARDIAC CATHETERIZATION N/A 10/05/2015   Procedure: Left Heart Cath;  Surgeon: Peter M Martinique, MD;  Location: [facility];  Service: Cardiovascular;  Laterality: N/A;  . CARDIAC CATHETERIZATION  10/05/2015   Procedure: Coronary Stent Intervention;  Surgeon: Peter M Martinique, MD;  Location: [facility];  Service: Cardiovascular;;  . COLONOSCOPY    . FOOT SURGERY Left 10/2013   Dr. [name]  . MENISCUS REPAIR       MEDICATIONS:   No prescriptions prior to admission.    ALLERGIES:   Allergies  Allergen Reactions  . No Known Allergies     REVIEW OF SYSTEMS:  A comprehensive review of systems was negative except for: Musculoskeletal: positive for arthralgias and stiff joints   FAMILY HISTORY:   Family History  Problem Relation Age of Onset  . Heart disease Maternal Grandfather   . Rheumatic fever Mother   . Heart disease Father     SOCIAL HISTORY:   Social History  Substance Use Topics  . Smoking status: Former Smoker    Years: 15.00    Types: Cigarettes    Quit date: 07/24/2000  . Smokeless tobacco: Never Used  . Alcohol use 0.0 oz/week     Comment: very occasionally - mixed drinks     EXAMINATION:  Vital signs in last 24 hours:    There were no vitals taken for this visit.  General Appearance:    Alert, cooperative, no distress, appears stated age  Head:    Normocephalic, without obvious abnormality, atraumatic  Eyes:    PERRL, conjunctiva/corneas clear, EOM's intact, fundi    benign, both eyes       Ears:    Normal TM's and external ear canals, both ears  Nose:   Nares normal, septum midline, mucosa normal, no drainage    or sinus tenderness  Throat:   Lips, mucosa, and tongue normal; teeth and gums normal  Neck:   Supple, symmetrical, trachea midline, no adenopathy;       thyroid:  No enlargement/tenderness/nodules; no carotid   bruit or JVD  Back:     Symmetric, no curvature, ROM normal, no CVA tenderness  Lungs:     Clear to auscultation bilaterally, respirations unlabored  Chest wall:    No tenderness or deformity  Heart:    Regular rate and rhythm, S1 and S2 normal, no murmur, rub   or gallop  Abdomen:     Soft, non-tender, bowel sounds active all four quadrants,    no masses, no organomegaly  Genitalia:    Normal male without lesion,  discharge or tenderness  Rectal:    Normal tone, normal prostate, no masses or tenderness;   guaiac negative stool  Extremities:   Extremities normal, atraumatic, no cyanosis or edema  Pulses:   2+ and symmetric all extremities  Skin:   Skin color, texture, turgor normal, no rashes or lesions  Lymph nodes:   Cervical, supraclavicular, and axillary nodes normal  Neurologic:   CNII-XII intact. Normal strength, sensation and reflexes      throughout     Musculoskeletal:  ROM 0-120, Ligaments intact,  Imaging Review Plain radiographs demonstrate severe degenerative joint disease of the right knee. The overall alignment is neutral. The bone quality appears to be excellent for age and reported activity level.  Assessment/Plan: Primary osteoarthritis, right knee   The patient history, physical examination and imaging studies are consistent with advanced degenerative joint disease of the right knee. The patient has failed conservative treatment.  The clearance notes were reviewed.  After discussion with the patient it was felt that Total Knee Replacement was indicated. The procedure,  risks, and benefits of total knee arthroplasty were presented and reviewed. The risks including but not limited to aseptic loosening, infection, blood clots, vascular injury, stiffness, patella tracking problems complications among others were discussed. The patient acknowledged the explanation, agreed to proceed with the plan. Donia Ast 10/12/2016, 6:09 AM

## 2016-10-12 NOTE — Anesthesia Postprocedure Evaluation (Signed)
Anesthesia Post Note  Patient: Joseph Riddle  Procedure(s) Performed: Procedure(s) (LRB): RIGHT TOTAL KNEE ARTHROPLASTY (Right)  Patient location during evaluation: PACU Anesthesia Type: Spinal Level of consciousness: awake Pain management: satisfactory to patient Vital Signs Assessment: post-procedure vital signs reviewed and stable Respiratory status: spontaneous breathing Cardiovascular status: blood pressure returned to baseline Postop Assessment: no headache and spinal receding Anesthetic complications: no    Last Vitals:  Vitals:   10/12/16 1225 10/12/16 1240  BP: 97/72 99/68  Pulse: (!) 54 (!) 49  Resp: (!) 9 11  Temp:      Last Pain:  Vitals:   10/12/16 0701  TempSrc: Oral                 [name] EDWARD

## 2016-10-12 NOTE — Anesthesia Procedure Notes (Addendum)
Anesthesia Regional Block:  Adductor canal block  Pre-Anesthetic Checklist: ,, timeout performed, Correct Patient, Correct Site, Correct Laterality, Correct Procedure, Correct Position, site marked, Risks and benefits discussed, pre-op evaluation,  At surgeon's request and post-op pain management  Laterality: Right  Prep: chloraprep       Needles:   Needle Type: Echogenic Needle     Needle Length: 9cm 9 cm Needle Gauge: 22 and 22 G    Additional Needles:  Procedures: ultrasound guided (picture in chart) Adductor canal block Narrative:  Injection made incrementally with aspirations every 5 mL. Anesthesiologist: Lyndle Herrlich  Additional Notes: .75% Naropin 20cc

## 2016-10-13 ENCOUNTER — Encounter (HOSPITAL_COMMUNITY): Payer: Self-pay | Admitting: Orthopedic Surgery

## 2016-10-13 LAB — BASIC METABOLIC PANEL
ANION GAP: 5 (ref 5–15)
BUN: 13 mg/dL (ref 6–20)
CALCIUM: 8.8 mg/dL — AB (ref 8.9–10.3)
CHLORIDE: 106 mmol/L (ref 101–111)
CO2: 26 mmol/L (ref 22–32)
CREATININE: 0.92 mg/dL (ref 0.61–1.24)
GFR calc non Af Amer: >60 mL/min (ref >60)
Glucose, Bld: 146 mg/dL — ABNORMAL HIGH (ref 65–99)
Potassium: 3.9 mmol/L (ref 3.5–5.1)
Sodium: 137 mmol/L (ref 135–145)

## 2016-10-13 LAB — CBC
HCT: 34.5 % — ABNORMAL LOW (ref 39.0–52.0)
HEMOGLOBIN: 11.8 g/dL — AB (ref 13.0–17.0)
MCH: 30.8 pg (ref 26.0–34.0)
MCHC: 34.2 g/dL (ref 30.0–36.0)
MCV: 90.1 fL (ref 78.0–100.0)
Platelets: 234 10*3/uL (ref 150–400)
RBC: 3.83 MIL/uL — ABNORMAL LOW (ref 4.22–5.81)
RDW: 12.1 % (ref 11.5–15.5)
WBC: 20.4 10*3/uL — AB (ref 4.0–10.5)

## 2016-10-13 MED ORDER — METHOCARBAMOL 500 MG PO TABS: 500.0000 mg | ORAL_TABLET | Freq: Four times a day (QID) | ORAL | 0 refills | Status: DC | PRN

## 2016-10-13 MED ORDER — OXYCODONE HCL 5 MG PO TABS: 5.0000 mg | ORAL_TABLET | ORAL | 0 refills | Status: DC | PRN

## 2016-10-13 NOTE — Progress Notes (Signed)
Physical Therapy Treatment Patient Details Name: Joseph Riddle MRN: [id number] DOB: 14-Feb-1956 Today's Date: 10/13/2016    History of Present Illness 60 yo male admitted on 10/12/16 for right TKA. PMH significant for cardiac findings including CAD and multiple catheterizations.     PT Comments    Patient is making good progress with PT.  From a mobility standpoint anticipate patient will be ready for DC home when medically ready.     Follow Up Recommendations  Home health PT     Equipment Recommendations  Rolling walker with 5" wheels;3in1 (PT)    Recommendations for Other Services       Precautions / Restrictions Precautions Precautions: Knee Precaution Comments: No pillow behind knee Restrictions RLE Weight Bearing: Weight bearing as tolerated    Mobility  Bed Mobility Overal bed mobility: Modified Independent             General bed mobility comments: increased time  Transfers Overall transfer level: Needs assistance Equipment used: Rolling walker (2 wheeled) Transfers: Sit to/from Stand Sit to Stand: Supervision         General transfer comment: supervision for safety  Ambulation/Gait Ambulation/Gait assistance: Supervision Ambulation Distance (Feet): 300 Feet Assistive device: Rolling walker (2 wheeled) Gait Pattern/deviations: Step-through pattern;Decreased weight shift to right;Trunk flexed Gait velocity: decreased   General Gait Details: steady gait; cues for posture and increased R knee extension during stance phase; pt with symmetrical step lengths and minimal reliance on UE support   Stairs Stairs: Yes Stairs assistance: Supervision Stair Management: One rail Right;Step to pattern;Forwards Number of Stairs: 10 General stair comments: cues for sequnecing/technique and safety  Wheelchair Mobility    Modified Rankin (Stroke Patients Only)       Balance     Sitting balance-Leahy Scale: Good       Standing balance-Leahy Scale:  Fair                      Cognition Arousal/Alertness: Awake/alert Behavior During Therapy: WFL for tasks assessed/performed Overall Cognitive Status: Within Functional Limits for tasks assessed                      Exercises Total Joint Exercises Quad Sets: AROM;Right;10 reps Heel Slides: AROM;Right;10 reps Straight Leg Raises: AROM;Right;10 reps Long Arc Quad: AROM;Right;10 reps Goniometric ROM: 10-88    General Comments        Pertinent Vitals/Pain Pain Assessment: 0-10 Pain Score: 2  Pain Location: R knee Pain Descriptors / Indicators: Aching;Sore;Tightness Pain Intervention(s): Limited activity within patient's tolerance;Monitored during session;Premedicated before session;Repositioned    Home Living                      Prior Function            PT Goals (current goals can now be found in the care plan section) Acute Rehab PT Goals Patient Stated Goal: to get better and get back to work Progress towards PT goals: Progressing toward goals    Frequency    7X/week      PT Plan Current plan remains appropriate    Co-evaluation             End of Session Equipment Utilized During Treatment: Gait belt Activity Tolerance: Patient tolerated treatment well Patient left: in chair;with call bell/phone within reach     Time: 0857-0930 PT Time Calculation (min) (ACUTE ONLY): 33 min  Charges:  $Gait Training: 8-22 mins $Therapeutic Exercise: 8-22  mins                    G Codes:      Salina April, PTA Pager: [phone]   10/13/2016, 9:37 AM

## 2016-10-13 NOTE — Discharge Summary (Signed)
SPORTS MEDICINE & JOINT REPLACEMENT   Joseph Mulch, MD   Joseph Shadow, PA-C [place], [place], [place]  [address]                             [phone]  PATIENT ID: Joseph Riddle        MRN:  [id number]          DOB/AGE: [birth year] / 60 y.o.    DISCHARGE SUMMARY  ADMISSION DATE:    10/12/2016 DISCHARGE DATE:   10/13/2016   ADMISSION DIAGNOSIS: primary osteoarthritis right knee    DISCHARGE DIAGNOSIS:  primary osteoarthritis right knee    ADDITIONAL DIAGNOSIS: Active Problems:   S/P total knee replacement  Past Medical History:  Diagnosis Date  . Arthritis   . Dyslipidemia 09/2015  . Dyspnea   . Ischemic cardiomyopathy 09/2015   EF 30-35 percent by echo 10/07/2015  . STEMI (ST elevation myocardial infarction) ([facility]) 10/05/2015   DES LAD, residual diagonal 90%, medical Rx    PROCEDURE: Procedure(s): RIGHT TOTAL KNEE ARTHROPLASTY on 10/12/2016  CONSULTS:    HISTORY:  See H&P in chart  HOSPITAL COURSE:  Joseph Riddle is a 60 y.o. admitted on 10/12/2016 and found to have a diagnosis of primary osteoarthritis right knee.  After appropriate laboratory studies were obtained  they were taken to the operating room on 10/12/2016 and underwent Procedure(s): RIGHT TOTAL KNEE ARTHROPLASTY.   They were given perioperative antibiotics:  Anti-infectives    Start     Dose/Rate Route Frequency Ordered Stop   10/12/16 1500  ceFAZolin (ANCEF) IVPB 1 g/50 mL premix     1 g 100 mL/hr over 30 Minutes Intravenous Every 6 hours 10/12/16 1354 10/12/16 2031   10/12/16 0600  ceFAZolin (ANCEF) IVPB 2g/100 mL premix     2 g 200 mL/hr over 30 Minutes Intravenous On call to O.R. 10/11/16 1406 10/12/16 0836    .  Patient given tranexamic acid IV or topical and exparel intra-operatively.  Tolerated the procedure well.    POD# 1: Vital signs were stable.  Patient denied Chest pain, shortness of breath, or calf pain.  Patient was started on Lovenox 30 mg subcutaneously twice daily at  8am.  Consults to PT, OT, and care management were made.  The patient was weight bearing as tolerated.  CPM was placed on the operative leg 0-90 degrees for 6-8 hours a day. When out of the CPM, patient was placed in the foam block to achieve full extension. Incentive spirometry was taught.  Dressing was changed.       POD #2, Continued  PT for ambulation and exercise program.  IV saline locked.  O2 discontinued.    The remainder of the hospital course was dedicated to ambulation and strengthening.   The patient was discharged on 1 Day Post-Op in  Good condition.  Blood products given:none  DIAGNOSTIC STUDIES: Recent vital signs: Patient Vitals for the past 24 hrs:  BP Temp Temp src Pulse Resp SpO2  10/13/16 0651 (!) 97/56 97.8 F (36.6 C) Oral 68 - 98 %  10/12/16 2341 103/65 97.8 F (36.6 C) Oral 71 14 100 %  10/12/16 1900 97/73 97.5 F (36.4 C) Oral 67 12 100 %  10/12/16 1240 99/68 - - (!) 49 11 100 %       Recent laboratory studies:  Recent Labs  10/13/16 0456  WBC 20.4*  HGB 11.8*  HCT 34.5*  PLT 234    Recent Labs  10/13/16 0456  NA 137  K 3.9  CL 106  CO2 26  BUN 13  CREATININE 0.92  GLUCOSE 146*  CALCIUM 8.8*   No results found for: INR, PROTIME   Recent Radiographic Studies :  No results found.  DISCHARGE INSTRUCTIONS: Discharge Instructions    CPM    Complete by:  As directed    Continuous passive motion machine (CPM):      Use the CPM from 0 to 90 for 4-6 hours per day.      You may increase by 10 per day.  You may break it up into 2 or 3 sessions per day.      Use CPM for 2 weeks or until you are told to stop.   Call MD / Call 911    Complete by:  As directed    If you experience chest pain or shortness of breath, CALL 911 and be transported to the hospital emergency room.  If you develope a fever above 101 F, pus (white drainage) or increased drainage or redness at the wound, or calf pain, call your surgeon's office.   Constipation Prevention     Complete by:  As directed    Drink plenty of fluids.  Prune juice may be helpful.  You may use a stool softener, such as Colace (over the counter) 100 mg twice a day.  Use MiraLax (over the counter) for constipation as needed.   Diet - low sodium heart healthy    Complete by:  As directed    Discharge instructions    Complete by:  As directed    INSTRUCTIONS AFTER JOINT REPLACEMENT   Remove items at home which could result in a fall. This includes throw rugs or furniture in walking pathways ICE to the affected joint every three hours while awake for 30 minutes at a time, for at least the first 3-5 days, and then as needed for pain and swelling.  Continue to use ice for pain and swelling. You may notice swelling that will progress down to the foot and ankle.  This is normal after surgery.  Elevate your leg when you are not up walking on it.   Continue to use the breathing machine you got in the hospital (incentive spirometer) which will help keep your temperature down.  It is common for your temperature to cycle up and down following surgery, especially at night when you are not up moving around and exerting yourself.  The breathing machine keeps your lungs expanded and your temperature down.   DIET:  As you were doing prior to hospitalization, we recommend a well-balanced diet.  DRESSING / WOUND CARE / SHOWERING  Keep the surgical dressing until follow up.  The dressing is water proof, so you can shower without any extra covering.  IF THE DRESSING FALLS OFF or the wound gets wet inside, change the dressing with sterile gauze.  Please use good hand washing techniques before changing the dressing.  Do not use any lotions or creams on the incision until instructed by your surgeon.    ACTIVITY  Increase activity slowly as tolerated, but follow the weight bearing instructions below.   No driving for 6 weeks or until further direction given by your physician.  You cannot drive while taking  narcotics.  No lifting or carrying greater than 10 lbs. until further directed by your surgeon. Avoid periods of inactivity such as sitting longer than an hour when  not asleep. This helps prevent blood clots.  You may return to work once you are authorized by your doctor.     WEIGHT BEARING   Weight bearing as tolerated with assist device (walker, cane, etc) as directed, use it as long as suggested by your surgeon or therapist, typically at least 4-6 weeks.   EXERCISES  Results after joint replacement surgery are often greatly improved when you follow the exercise, range of motion and muscle strengthening exercises prescribed by your doctor. Safety measures are also important to protect the joint from further injury. Any time any of these exercises cause you to have increased pain or swelling, decrease what you are doing until you are comfortable again and then slowly increase them. If you have problems or questions, call your caregiver or physical therapist for advice.   Rehabilitation is important following a joint replacement. After just a few days of immobilization, the muscles of the leg can become weakened and shrink (atrophy).  These exercises are designed to build up the tone and strength of the thigh and leg muscles and to improve motion. Often times heat used for twenty to thirty minutes before working out will loosen up your tissues and help with improving the range of motion but do not use heat for the first two weeks following surgery (sometimes heat can increase post-operative swelling).   These exercises can be done on a training (exercise) mat, on the floor, on a table or on a bed. Use whatever works the best and is most comfortable for you.    Use music or television while you are exercising so that the exercises are a pleasant break in your day. This will make your life better with the exercises acting as a break in your routine that you can look forward to.   Perform all  exercises about fifteen times, three times per day or as directed.  You should exercise both the operative leg and the other leg as well.   Exercises include:   Quad Sets - Tighten up the muscle on the front of the thigh (Quad) and hold for 5-10 seconds.   Straight Leg Raises - With your knee straight (if you were given a brace, keep it on), lift the leg to 60 degrees, hold for 3 seconds, and slowly lower the leg.  Perform this exercise against resistance later as your leg gets stronger.  Leg Slides: Lying on your back, slowly slide your foot toward your buttocks, bending your knee up off the floor (only go as far as is comfortable). Then slowly slide your foot back down until your leg is flat on the floor again.  Angel Wings: Lying on your back spread your legs to the side as far apart as you can without causing discomfort.  Hamstring Strength:  Lying on your back, push your heel against the floor with your leg straight by tightening up the muscles of your buttocks.  Repeat, but this time bend your knee to a comfortable angle, and push your heel against the floor.  You may put a pillow under the heel to make it more comfortable if necessary.   A rehabilitation program following joint replacement surgery can speed recovery and prevent re-injury in the future due to weakened muscles. Contact your doctor or a physical therapist for more information on knee rehabilitation.    CONSTIPATION  Constipation is defined medically as fewer than three stools per week and severe constipation as less than one stool per week.  Even  if you have a regular bowel pattern at home, your normal regimen is likely to be disrupted due to multiple reasons following surgery.  Combination of anesthesia, postoperative narcotics, change in appetite and fluid intake all can affect your bowels.   YOU MUST use at least one of the following options; they are listed in order of increasing strength to get the job done.  They are all  available over the counter, and you may need to use some, POSSIBLY even all of these options:    Drink plenty of fluids (prune juice may be helpful) and high fiber foods Colace 100 mg by mouth twice a day  Senokot for constipation as directed and as needed Dulcolax (bisacodyl), take with full glass of water  Miralax (polyethylene glycol) once or twice a day as needed.  If you have tried all these things and are unable to have a bowel movement in the first 3-4 days after surgery call either your surgeon or your primary doctor.    If you experience loose stools or diarrhea, hold the medications until you stool forms back up.  If your symptoms do not get better within 1 week or if they get worse, check with your doctor.  If you experience "the worst abdominal pain ever" or develop nausea or vomiting, please contact the office immediately for further recommendations for treatment.   ITCHING:  If you experience itching with your medications, try taking only a single pain pill, or even half a pain pill at a time.  You can also use Benadryl over the counter for itching or also to help with sleep.   TED HOSE STOCKINGS:  Use stockings on both legs until for at least 2 weeks or as directed by physician office. They may be removed at night for sleeping.  MEDICATIONS:  See your medication summary on the "After Visit Summary" that nursing will review with you.  You may have some home medications which will be placed on hold until you complete the course of blood thinner medication.  It is important for you to complete the blood thinner medication as prescribed.  PRECAUTIONS:  If you experience chest pain or shortness of breath - call 911 immediately for transfer to the hospital emergency department.   If you develop a fever greater that 101 F, purulent drainage from wound, increased redness or drainage from wound, foul odor from the wound/dressing, or calf pain - CONTACT YOUR SURGEON.                                                    FOLLOW-UP APPOINTMENTS:  If you do not already have a post-op appointment, please call the office for an appointment to be seen by your surgeon.  Guidelines for how soon to be seen are listed in your "After Visit Summary", but are typically between 1-4 weeks after surgery.  OTHER INSTRUCTIONS:   Knee Replacement:  Do not place pillow under knee, focus on keeping the knee straight while resting. CPM instructions: 0-90 degrees, 2 hours in the morning, 2 hours in the afternoon, and 2 hours in the evening. Place foam block, curve side up under heel at all times except when in CPM or when walking.  DO NOT modify, tear, cut, or change the foam block in any way.  MAKE SURE YOU:  Understand these instructions.  Get help right away if you are not doing well or get worse.    Thank you for letting us be a part of your medical care team.  It is a privilege we respect greatly.  We hope these instructions will help you stay on track for a fast and full recovery!   Increase activity slowly as tolerated    Complete by:  As directed       DISCHARGE MEDICATIONS:     Medication List    STOP taking these medications   acetaminophen 325 MG tablet Commonly known as:  TYLENOL   tizanidine 2 MG capsule Commonly known as:  ZANAFLEX     TAKE these medications   aspirin 81 MG chewable tablet Chew 1 tablet (81 mg total) by mouth daily.   atorvastatin 40 MG tablet Commonly known as:  LIPITOR Take 1 tablet (40 mg total) by mouth daily at 6 PM.   carvedilol 3.125 MG tablet Commonly known as:  COREG Take 1 tablet (3.125 mg total) by mouth 2 (two) times daily.   clopidogrel 75 MG tablet Commonly known as:  PLAVIX Take 1 tablet (75 mg total) by mouth daily.   COQ10 PO Take 1 tablet by mouth daily.   doxycycline 50 MG capsule Commonly known as:  VIBRAMYCIN Take 50 mg by mouth 2 (two) times daily as needed (for sinus pressure).   FISH OIL PO Take 1,200 mg by mouth daily.    gabapentin 300 MG capsule Commonly known as:  NEURONTIN Take 300-900 mg by mouth 2 (two) times daily. Take 300 mg by mouth each afternoon. Take 900 mg by mouth at bedtime.   lisinopril 2.5 MG tablet Commonly known as:  PRINIVIL,ZESTRIL Take 1 tablet (2.5 mg total) by mouth daily.   methocarbamol 500 MG tablet Commonly known as:  ROBAXIN Take 1 tablet (500 mg total) by mouth every 6 (six) hours as needed for muscle spasms.   multivitamin tablet Take 1 tablet by mouth daily.   nitroGLYCERIN 0.4 MG SL tablet Commonly known as:  NITROSTAT Place 1 tablet (0.4 mg total) under the tongue every 5 (five) minutes x 3 doses as needed for chest pain.   oxyCODONE 5 MG immediate release tablet Commonly known as:  Oxy IR/ROXICODONE Take 1-2 tablets (5-10 mg total) by mouth every 3 (three) hours as needed for breakthrough pain.   zolpidem 10 MG tablet Commonly known as:  AMBIEN Take 10 mg by mouth at bedtime as needed for sleep.            Durable Medical Equipment        Start     Ordered   10/12/16 1354  DME Walker rolling  Once    Question:  Patient needs a walker to treat with the following condition  Answer:  S/P total knee replacement   10/12/16 1354   10/12/16 1354  DME 3 n 1  Once     10/12/16 1354   10/12/16 1354  DME Bedside commode  Once    Question:  Patient needs a bedside commode to treat with the following condition  Answer:  S/P total knee replacement   10/12/16 1354      FOLLOW UP VISIT:    DISPOSITION: HOME VS. SNF  CONDITION:  Good   Donia Ast 10/13/2016, 12:37 PM

## 2016-10-13 NOTE — Op Note (Signed)
TOTAL KNEE REPLACEMENT OPERATIVE NOTE:  10/12/2016  10:14 AM  PATIENT:  Joseph Riddle  60 y.o. male  PRE-OPERATIVE DIAGNOSIS:  primary osteoarthritis right knee  POST-OPERATIVE DIAGNOSIS:  primary osteoarthritis right knee  PROCEDURE:  Procedure(s): RIGHT TOTAL KNEE ARTHROPLASTY  SURGEON:  Surgeon(s): Vickey Huger, MD  PHYSICIAN ASSISTANT:  Nehemiah Massed, [facility]  ANESTHESIA:   spinal  DRAINS: Hemovac  SPECIMEN: None  COUNTS:  Correct  TOURNIQUET:   Total Tourniquet Time Documented: Thigh (Right) - [id number] minutes Total: Thigh (Right) - [id number] minutes   DICTATION:  Indication for procedure:    The patient is a 60 y.o. male who has failed conservative treatment for primary osteoarthritis right knee.  Informed consent was obtained prior to anesthesia. The risks versus benefits of the operation were explain and in a way the patient can, and did, understand.   On the implant demand matching protocol, this patient scored 10.  Therefore, this patient was not receive a polyethylene insert with vitamin E which is a high demand implant.  Description of procedure:     The patient was taken to the operating room and placed under anesthesia.  The patient was positioned in the usual fashion taking care that all body parts were adequately padded and/or protected.  I foley catheter was not placed.  A tourniquet was applied and the leg prepped and draped in the usual sterile fashion.  The extremity was exsanguinated with the esmarch and tourniquet inflated to 350 mmHg.  Pre-operative range of motion was 20-110 degrees flexion.  The knee was in 5 degree of mild valgus.  A midline incision approximately 6-7 inches long was made with a #10 blade.  A new blade was used to make a parapatellar arthrotomy going 2-3 cm into the quadriceps tendon, over the patella, and alongside the medial aspect of the patellar tendon.  A synovectomy was then performed with the #10 blade and forceps. I then elevated  the deep MCL off the medial tibial metaphysis subperiosteally around to the semimembranosus attachment.    I everted the patella and used calipers to measure patellar thickness.  I used the reamer to ream down to appropriate thickness to recreate the native thickness.  I then removed excess bone with the rongeur and sagittal saw.  I used the appropriately sized template and drilled the three lug holes.  I then put the trial in place and measured the thickness with the calipers to ensure recreation of the native thickness.  The trial was then removed and the patella subluxed and the knee brought into flexion.  A homan retractor was place to retract and protect the patella and lateral structures.  A Z-retractor was place medially to protect the medial structures.  The extra-medullary alignment system was used to make cut the tibial articular surface perpendicular to the anamotic axis of the tibia and in 3 degrees of posterior slope.  The cut surface and alignment jig was removed.  I then used the intramedullary alignment guide to make a 4 valgus cut on the distal femur.  I then marked out the epicondylar axis on the distal femur.  The posterior condylar axis measured 3 degrees.  I then used the anterior referencing sizer and measured the femur to be a size 9.  The 4-In-1 cutting block was screwed into place in external rotation matching the posterior condylar angle, making our cuts perpendicular to the epicondylar axis.  Anterior, posterior and chamfer cuts were made with the sagittal saw.  The cutting block  and cut pieces were removed.  A lamina spreader was placed in 90 degrees of flexion.  The ACL, PCL, menisci, and posterior condylar osteophytes were removed.  A 11 mm spacer blocked was found to offer good flexion and extension gap balance after minimal in degree releasing.   The scoop retractor was then placed and the femoral finishing block was pinned in place.  The small sagittal saw was used as well as  the lug drill to finish the femur.  The block and cut surfaces were removed and the medullary canal hole filled with autograft bone from the cut pieces.  The tibia was delivered forward in deep flexion and external rotation.  A size E tray was selected and pinned into place centered on the medial 1/3 of the tibial tubercle.  The reamer and keel was used to prepare the tibia through the tray.    I then trialed with the size 9 femur, size E tibia, a 11 mm insert and the 32 patella.  I had excellent flexion/extension gap balance, excellent patella tracking.  Flexion was full and beyond 120 degrees; extension was zero.  These components were chosen and the staff opened them to me on the back table while the knee was lavaged copiously and the cement mixed.  The soft tissue was infiltrated with 60cc of exparel 1.3% through a 21 gauge needle.  I cemented in the components and removed all excess cement.  The polyethylene tibial component was snapped into place and the knee placed in extension while cement was hardening.  The capsule was infilltrated with 30cc of .25% Marcaine with epinephrine.  A hemovac was place in the joint exiting superolaterally.  A pain pump was place superomedially superficial to the arthrotomy.  Once the cement was hard, the tourniquet was let down.  Hemostasis was obtained.  The arthrotomy was closed with figure-8 #1 vicryl sutures.  The deep soft tissues were closed with #0 vicryls and the subcuticular layer closed with a running #2-0 vicryl.  The skin was reapproximated and closed with skin staples.  The wound was dressed with xeroform, 4 x4's, 2 ABD sponges, a single layer of webril and a TED stocking.   The patient was then awakened, extubated, and taken to the recovery room in stable condition.  BLOOD LOSS:  300cc DRAINS: 1 hemovac, 1 pain catheter COMPLICATIONS:  None.  PLAN OF CARE: Admit to inpatient   PATIENT DISPOSITION:  PACU - hemodynamically stable.   Delay start of  Pharmacological VTE agent (>24hrs) due to surgical blood loss or risk of bleeding:  not applicable  Please fax a copy of this op note to my office at [phone] (please only include page 1 and 2 of the Case Information op note)

## 2016-10-13 NOTE — Progress Notes (Signed)
SPORTS MEDICINE AND JOINT REPLACEMENT  Lara Mulch, MD    Carlyon Shadow, PA-C [address], [place], [place]  [address]                             [phone]   PROGRESS NOTE  Subjective:  negative for Chest Pain  negative for Shortness of Breath  negative for Nausea/Vomiting   negative for Calf Pain  negative for Bowel Movement   Tolerating Diet: yes         Patient reports pain as 4 on 0-10 scale.    Objective: Vital signs in last 24 hours:   Patient Vitals for the past 24 hrs:  BP Temp Temp src Pulse Resp SpO2  10/13/16 0651 (!) 97/56 97.8 F (36.6 C) Oral 68 - 98 %  10/12/16 2341 103/65 97.8 F (36.6 C) Oral 71 14 100 %  10/12/16 1900 97/73 97.5 F (36.4 C) Oral 67 12 100 %  10/12/16 1240 99/68 - - (!) 49 11 100 %    @flow {1959:LAST@   Intake/Output from previous day:   11/20 0701 - 11/21 0700 In: 2090 [P.O.:540; I.V.:1550] Out: 675 [Urine:600]   Intake/Output this shift:   No intake/output data recorded.   Intake/Output      11/20 0701 - 11/21 0700 11/21 0701 - 11/22 0700   P.O. 540    I.V. (mL/kg) 1550 (24.1)    Total Intake(mL/kg) 2090 (32.5)    Urine (mL/kg/hr) 600 (0.4)    Blood 75 (0)    Total Output 675     Net +1415          Urine Occurrence 3 x       LABORATORY DATA:  Recent Labs  10/13/16 0456  WBC 20.4*  HGB 11.8*  HCT 34.5*  PLT 234    Recent Labs  10/13/16 0456  NA 137  K 3.9  CL 106  CO2 26  BUN 13  CREATININE 0.92  GLUCOSE 146*  CALCIUM 8.8*   No results found for: INR, PROTIME  Examination:  General appearance: alert, cooperative and no distress Extremities: extremities normal, atraumatic, no cyanosis or edema  Wound Exam: clean, dry, intact   Drainage:  None: wound tissue dry  Motor Exam: Quadriceps and Hamstrings Intact  Sensory Exam: Superficial Peroneal, Deep Peroneal and Tibial normal   Assessment:    1 Day Post-Op  Procedure(s) (LRB): RIGHT TOTAL KNEE ARTHROPLASTY (Right)  ADDITIONAL  DIAGNOSIS:  Active Problems:   S/P total knee replacement     Plan: Physical Therapy as ordered Weight Bearing as Tolerated (WBAT)  DVT Prophylaxis:  Aspirin  DISCHARGE PLAN: Home  DISCHARGE NEEDS: HHPT   Patient doing great, will D/C today         Donia Ast 10/13/2016, 12:33 PM

## 2016-10-13 NOTE — Progress Notes (Signed)
Pt ready for d/c per MD. Pt met PT/OT goals, necessary equipment will be delivered to pt's house. Discharge instructions and prescriptions reviewed with pt and his wife, all questions answered. Will be assisted to car via volunteer.

## 2016-10-26 NOTE — Addendum Note (Signed)
Addendum  created 10/26/16 1857 by Lyndle Herrlich, MD   Anesthesia Intra Blocks edited, Sign clinical note

## 2016-11-12 ENCOUNTER — Other Ambulatory Visit: Payer: Self-pay

## 2016-11-12 MED ORDER — CLOPIDOGREL BISULFATE 75 MG PO TABS: 75.0000 mg | ORAL_TABLET | Freq: Every day | ORAL | 6 refills | Status: DC

## 2016-11-18 ENCOUNTER — Other Ambulatory Visit: Payer: Self-pay | Admitting: Physician Assistant

## 2016-11-18 DIAGNOSIS — M150 Primary generalized (osteo)arthritis: Principal | ICD-10-CM

## 2016-11-18 DIAGNOSIS — G8929 Other chronic pain: Secondary | ICD-10-CM

## 2016-11-18 DIAGNOSIS — M25561 Pain in right knee: Secondary | ICD-10-CM

## 2016-11-18 DIAGNOSIS — M159 Polyosteoarthritis, unspecified: Secondary | ICD-10-CM

## 2016-11-20 ENCOUNTER — Ambulatory Visit (HOSPITAL_COMMUNITY)
Admission: RE | Admit: 2016-11-20 | Discharge: 2016-11-20 | Disposition: A | Discharge status: Home / Self Care | Payer: BLUE CROSS/BLUE SHIELD | Source: Ambulatory Visit | Attending: Cardiology | Admitting: Cardiology

## 2016-11-20 VITALS — BP 86/64 | HR 75 | Wt 138.0 lb

## 2016-11-20 DIAGNOSIS — Z96651 Presence of right artificial knee joint: Secondary | ICD-10-CM | POA: Diagnosis not present

## 2016-11-20 DIAGNOSIS — I5022 Chronic systolic (congestive) heart failure: Secondary | ICD-10-CM | POA: Diagnosis not present

## 2016-11-20 DIAGNOSIS — I2510 Atherosclerotic heart disease of native coronary artery without angina pectoris: Secondary | ICD-10-CM | POA: Diagnosis not present

## 2016-11-20 DIAGNOSIS — I255 Ischemic cardiomyopathy: Secondary | ICD-10-CM | POA: Diagnosis not present

## 2016-11-20 DIAGNOSIS — I252 Old myocardial infarction: Secondary | ICD-10-CM | POA: Diagnosis not present

## 2016-11-20 DIAGNOSIS — Z7982 Long term (current) use of aspirin: Secondary | ICD-10-CM | POA: Diagnosis not present

## 2016-11-20 DIAGNOSIS — Z79899 Other long term (current) drug therapy: Secondary | ICD-10-CM | POA: Diagnosis not present

## 2016-11-20 DIAGNOSIS — Z9861 Coronary angioplasty status: Secondary | ICD-10-CM

## 2016-11-20 DIAGNOSIS — I959 Hypotension, unspecified: Secondary | ICD-10-CM | POA: Diagnosis not present

## 2016-11-20 MED ORDER — SPIRONOLACTONE 25 MG PO TABS: 12.5000 mg | ORAL_TABLET | Freq: Every day | ORAL | 3 refills | Status: DC

## 2016-11-20 NOTE — Patient Instructions (Signed)
START Spironolactone 12.5 mg (1.2 tablet) once daily at bedtime.  Return in 2 weeks for lab work.  Follow up 6 weeks with Dr. Aundra Dubin.  Happy New Year!  Do the following things EVERYDAY: 1) Weigh yourself in the morning before breakfast. Write it down and keep it in a log. 2) Take your medicines as prescribed 3) Eat low salt foods-Limit salt (sodium) to 2000 mg per day.  4) Stay as active as you can everyday 5) Limit all fluids for the day to less than 2 liters

## 2016-11-20 NOTE — Progress Notes (Signed)
PCP: Gwenlyn Perking Cardiology: Dr. [name] HF Cardiology: Dr. Aundra Dubin  60 yo with history of CAD s/p anterior MI in 11/16 and ischemic cardiomyopathy presents for CHF clinic evaluation.  Patient was in good health until 11/16.  At that time, he had anterior STEMI treated with DES to proximal LAD.  Echo at the time showed EF 30-35%.  Since then, he has had 2 further echoes, both with EF 35-40%, most recently in 9/17.    He has severe OA R knee and is now s/p R TKR with Dr. Ronnie Derby with no complication.   He presents today for regular follow up.  At last visit coreg added. Has tolerated well so far. BP remains low chronically. Still having some pain in his right knee and having some trouble sleeping. Working with physical therapy. Hasn't had much SOB. Has been doing his exercises 30-60 minutes daily without SOB. No SOB going up steps. Getting around a lot better s/p TKR. Coreg up to 3.125 mg BID. No dizziness or lightheadedness. Denies palpitations. SBP 95-105 at home.   ECG (9/17): NSR, old anterior MI  Labs (2/17): LDL 46 Labs (11/17): K 3.9, creatinine 0.92, LFTs normal  PMH: 1. Hyperlipidemia 2. OA: right knee. Right TKR 11/17.  3. CAD: Anterior STEMI 11/16.  Totally occluded LAD on cath => DES to proximal LAD.   4. Chronic systolic CHF: Ischemic cardiomyopathy. - 11/16 echo: EF 30-35%  - 2/17 echo: EF 35-40% with regional WMAs.  - 9/17 echo: EF 35-40%, anteroseptal/apical akinesis.  SH: Lives in [place], works as Optometrist, married, quit smoking > 15 years ago, occasional ETOH.   FH: No premature CAD  ROS: All systems reviewed and negative except as per HPI.   Current Outpatient Prescriptions  Medication Sig Dispense Refill  . aspirin 81 MG chewable tablet Chew 1 tablet (81 mg total) by mouth daily.    Marland Kitchen atorvastatin (LIPITOR) 40 MG tablet Take 1 tablet (40 mg total) by mouth daily at 6 PM. 30 tablet 6  . carvedilol (COREG) 3.125 MG tablet Take 1 tablet (3.125 mg total) by mouth 2  (two) times daily. 60 tablet 3  . clopidogrel (PLAVIX) 75 MG tablet Take 1 tablet (75 mg total) by mouth daily. 30 tablet 6  . Coenzyme Q10 (COQ10 PO) Take 1 tablet by mouth daily.    Marland Kitchen doxycycline (VIBRAMYCIN) 50 MG capsule Take 50 mg by mouth 2 (two) times daily as needed (for sinus pressure).   3  . gabapentin (NEURONTIN) 300 MG capsule Take 300-900 mg by mouth 2 (two) times daily. Take 300 mg by mouth each afternoon. Take 900 mg by mouth at bedtime.  11  . lisinopril (PRINIVIL,ZESTRIL) 2.5 MG tablet Take 1 tablet (2.5 mg total) by mouth daily. 30 tablet 6  . methocarbamol (ROBAXIN) 500 MG tablet Take 1 tablet (500 mg total) by mouth every 6 (six) hours as needed for muscle spasms. 60 tablet 0  . Multiple Vitamin (MULTIVITAMIN) tablet Take 1 tablet by mouth daily.    . nitroGLYCERIN (NITROSTAT) 0.4 MG SL tablet Place 1 tablet (0.4 mg total) under the tongue every 5 (five) minutes x 3 doses as needed for chest pain. 25 tablet 2  . Omega-3 Fatty Acids (FISH OIL PO) Take 1,200 mg by mouth daily.    Marland Kitchen oxyCODONE (OXY IR/ROXICODONE) 5 MG immediate release tablet Take 1-2 tablets (5-10 mg total) by mouth every 3 (three) hours as needed for breakthrough pain. 60 tablet 0  . zolpidem (AMBIEN) 10 MG  tablet Take 10 mg by mouth at bedtime as needed for sleep.     No current facility-administered medications for this encounter.    BP (!) 86/64 (BP Location: Right Arm, Patient Position: Sitting, Cuff Size: Normal)   Pulse 75   Wt 138 lb (62.6 kg)   SpO2 100%   BMI 20.09 kg/m    Wt Readings from Last 3 Encounters:  11/20/16 138 lb (62.6 kg)  10/12/16 142 lb (64.4 kg)  10/02/16 142 lb (64.4 kg)   General: Well appearing. NAD.  Neck: No JVD, no thyromegaly or thyroid nodule.  Lungs: Clear to auscultation bilaterally with normal respiratory effort. CV: Nondisplaced PMI.  Heart regular S1/S2, no S3/S4, no murmur. No carotid bruit.  Normal pedal pulses.  Abdomen: Soft, NT, ND, no HSM. No bruits or  masses. +BS  Skin: Intact without lesions or rashes.  Neurologic: Alert and oriented x 3.  Psych: Normal affect. Extremities: No clubbing or cyanosis. Trace edema in R leg. S/p R TKR.  HEENT: Normal.   Assessment/Plan: 1. CAD: s/p anterior MI 11/16 with DES to proximal LAD.  No recurrent chest pain.   - He will continue ASA 81 and atorvastatin 40 daily.  - DAPT score = 2.  Now on Plavix 75 mg daily which I would have him continue long-term as long as he does not have bleeding issues. Stopped Brilinta prior to knee surgery. 2. Chronic systolic CHF:  Echo 0000000 with EF 35-40%.  He has been in this range since his MI.  Ischemic cardiomyopathy. Volume status stable on exam.  Soft BP limits medication titration though pt denies lightheadedness.  - Continue lisinopril 2.5 mg daily.  - Continue Coreg 3.125 mg BID.   - Add spironolactone 12.5 mg qhs. BMET 2 weeks.    - EF just out of range for ICD and QRS not wide.   - Will plan for CPX once finished with PT from TKR.   Followup in 6 wks.   Loralie Champagne 11/21/2016

## 2016-11-20 NOTE — Telephone Encounter (Signed)
Pt takes 4 per day. Refilled x 30 days with note pt past due for annual physical

## 2016-12-04 ENCOUNTER — Ambulatory Visit (HOSPITAL_COMMUNITY)
Admission: RE | Admit: 2016-12-04 | Discharge: 2016-12-04 | Disposition: A | Discharge status: Home / Self Care | Payer: BLUE CROSS/BLUE SHIELD | Source: Ambulatory Visit | Attending: Internal Medicine | Admitting: Internal Medicine

## 2016-12-04 DIAGNOSIS — I255 Ischemic cardiomyopathy: Secondary | ICD-10-CM | POA: Diagnosis not present

## 2016-12-04 LAB — BASIC METABOLIC PANEL
Anion gap: 9 (ref 5–15)
BUN: 14 mg/dL (ref 6–20)
CALCIUM: 9.3 mg/dL (ref 8.9–10.3)
CO2: 30 mmol/L (ref 22–32)
CREATININE: 0.92 mg/dL (ref 0.61–1.24)
Chloride: 99 mmol/L — ABNORMAL LOW (ref 101–111)
Glucose, Bld: 107 mg/dL — ABNORMAL HIGH (ref 65–99)
Potassium: 4.2 mmol/L (ref 3.5–5.1)
SODIUM: 138 mmol/L (ref 135–145)

## 2016-12-14 ENCOUNTER — Encounter: Payer: Self-pay | Admitting: Internal Medicine

## 2016-12-14 ENCOUNTER — Ambulatory Visit (INDEPENDENT_AMBULATORY_CARE_PROVIDER_SITE_OTHER): Payer: BLUE CROSS/BLUE SHIELD | Admitting: Internal Medicine

## 2016-12-14 VITALS — BP 76/50 | HR 70 | Ht 69.0 in | Wt 133.0 lb

## 2016-12-14 DIAGNOSIS — I5022 Chronic systolic (congestive) heart failure: Secondary | ICD-10-CM | POA: Insufficient documentation

## 2016-12-14 DIAGNOSIS — I959 Hypotension, unspecified: Secondary | ICD-10-CM | POA: Diagnosis not present

## 2016-12-14 DIAGNOSIS — R5383 Other fatigue: Secondary | ICD-10-CM | POA: Insufficient documentation

## 2016-12-14 NOTE — Patient Instructions (Signed)
Your physician has recommended you make the following change in your medication: STOP carvedilol (coreg)  Your physician wants you to follow-up in: [place] with Dr. [name]. You will receive a reminder letter in the mail two months in advance. If you don't receive a letter, please call our office to schedule the follow-up appointment.

## 2016-12-14 NOTE — Progress Notes (Signed)
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 He has had previous surgeries and had bad outcomes and says that he never wants to have surgery again but does appear to be in significant pain. Particularly in the left shoulder and left neck area. He has pain in the base of his neck that radiates to shoulder and down to the left side of his chest. This is somewhat the pain that he feels however sometimes when he is doing exercise or exerting himself he gets shortness of breath a little more discomfort in his chest. When it is hot outside he reports some palpitations. The symptoms sound somewhat atypical for cardiac chest pain however he is concerned that this could be cardiac chest pain. He reports that he was referred here for a stress test today, however he was referred for an evaluation for stress test.  Mr. Joseph Riddle returns today for follow-up of an anterior STEMI which she had in November 2016. I last saw him in 2015 when he was referred for chest pain. He underwent an exercise stress test that same day which was negative for ischemia. He supposedly did well over the next year but then presented acutely with chest pain. He says that that chest pain had gone on for several weeks and was found to have 100% occlusion proximally of the LAD. This was stented with a drug-eluting stent and his echocardiogram showed reduced LV function with EF of 30-35%. Posthospitalization he followed up with Rosaria Ferries, PA-C, who ordered another echocardiogram and that shows that there is been a slight increase in EF up to 35-40%. He also recently had lipid testing. His total cholesterol is 93 and LDL is 46. He is still on atorvastatin 80 mg daily. This was a new medicine  during his hospitalization. He reports being compliant on aspirin and Brilinta. He denies any chest pain or worsening shortness of breath. He has significant right knee pain and significant joint destruction with significant angulation of the leg. He was scheduled to get knee replacement surgery in April but had to postpone that. I instructed that he would not be able candidate for knee surgery at least until November 2017 per  06/29/2016  Joseph Riddle returns today for follow-up. He's had a couple of chest twinges since I last saw him for which he took nitroglycerin. This did not immediately report to relieve his symptoms. They persisted for about an hour. It is unlikely these are anginal symptoms. At his last office visit we decreased his Lipitor to 40 mg and will reassess his cholesterol at his next office visit. He is contemplating right knee surgery with Dr. Ronnie Derby. We will obtain a limited echo as it's been 6 months since his cardiac event to see if LV function has improved at all. He can discontinue his Brilinta as of 10/04/2016 and subsequently undergo surgery with intermediate risk.  09/09/2016  Joseph Riddle was seen today for follow-up of his echo. This shows no change in LV function with EF around 35-40%. Given his hypotension with blood pressure in the 90s to low 100s, to been difficult to add additional medication for improved LV function. At this point we again discussed discontinuing his Brilinta as of 10/04/2016. I felt that he could undergo  surgery. When I told him this, however he seemed quite disappointed. He said "I was hoping you might say that I was not healthy enough for surgery". I did tell him that he could likely be at intermediate risk for surgery which would be associated with about a 5% chance of adverse events such as perioperative MI, heart failure or arrhythmia. He did seem to have some ambivalence about surgery and after discussing it I mentioned that there would was forcing him to  have surgery, but he does seem very anxious about that as well as his cardiac condition in general. He still reports significant fatigue.  12/14/2016  Joseph Riddle returns today for follow-up. In the interim he underwent right total knee replacement. He continues to feel like this has not been helpful. He has pain in his legs and his knee. He reports working long days. He generally has little energy and fatigue. I asked him about whether his recent medicine changes of helped his shortness of breath however he says he has "never complained of shortness of breath". However it was good documented we discussed his symptoms of shortness of breath and fatigue at his last office visit. For this reason I had referred him to advanced heart failure to see if there was additional therapy that may improve his symptoms. He was seen by Dr. Aundra Dubin who started him on carvedilol as well as low-dose Aldactone, however blood pressure today was exceedingly low at 76/50. This was checked twice and he does report over the past 1-2 weeks she's been more fatigued than typical.  PMHx:  Past Medical History:  Diagnosis Date  . Arthritis   . Dyslipidemia 09/2015  . Dyspnea   . Ischemic cardiomyopathy 09/2015   EF 30-35 percent by echo 10/07/2015  . STEMI (ST elevation myocardial infarction) ([place]) 10/05/2015   DES LAD, residual diagonal 90%, medical Rx    Past Surgical History:  Procedure Laterality Date  . CARDIAC CATHETERIZATION N/A 10/05/2015   Procedure: Left Heart Cath;  Surgeon: Peter M Martinique, MD;  Location: [facility];  Service: Cardiovascular;  Laterality: N/A;  . CARDIAC CATHETERIZATION  10/05/2015   Procedure: Coronary Stent Intervention;  Surgeon: Peter M Martinique, MD;  Location: [facility];  Service: Cardiovascular;;  . COLONOSCOPY    . FOOT SURGERY Left 10/2013   Dr. [name]  . MENISCUS REPAIR    . TOTAL KNEE ARTHROPLASTY Right 10/12/2016   Procedure: RIGHT TOTAL KNEE ARTHROPLASTY;   Surgeon: Vickey Huger, MD;  Location: [facility];  Service: Orthopedics;  Laterality: Right;    FAMHx:  Family History  Problem Relation Age of Onset  . Heart disease Maternal Grandfather   . Rheumatic fever Mother   . Heart disease Father     SOCHx:   reports that he quit smoking about 16 years ago. His smoking use included Cigarettes. He quit after 15.00 years of use. He has never used smokeless tobacco. He reports that he drinks alcohol. He reports that he does not use drugs.  ALLERGIES:  Allergies  Allergen Reactions  . No Known Allergies     ROS: Pertinent items noted in HPI and remainder of comprehensive ROS otherwise negative.  HOME MEDS: Current Outpatient Prescriptions  Medication Sig Dispense Refill  . aspirin 81 MG chewable tablet Chew 1 tablet (81 mg total) by mouth daily.    Marland Kitchen atorvastatin (LIPITOR) 40 MG tablet Take 1 tablet (40 mg total) by mouth daily at 6 PM. 30 tablet 6  .  clopidogrel (PLAVIX) 75 MG tablet Take 1 tablet (75 mg total) by mouth daily. 30 tablet 6  . Coenzyme Q10 (COQ10 PO) Take 1 tablet by mouth daily.    Marland Kitchen doxycycline (VIBRAMYCIN) 50 MG capsule Take 50 mg by mouth 2 (two) times daily as needed (for sinus pressure).   3  . gabapentin (NEURONTIN) 300 MG capsule TAKE 1 CAPSULE AT MIDDAY AND 3 CAPSULES AT BEDTIME 120 capsule 0  . lisinopril (PRINIVIL,ZESTRIL) 2.5 MG tablet Take 1 tablet (2.5 mg total) by mouth daily. 30 tablet 6  . methocarbamol (ROBAXIN) 500 MG tablet Take 1 tablet (500 mg total) by mouth every 6 (six) hours as needed for muscle spasms. 60 tablet 0  . Multiple Vitamin (MULTIVITAMIN) tablet Take 1 tablet by mouth daily.    . nitroGLYCERIN (NITROSTAT) 0.4 MG SL tablet Place 1 tablet (0.4 mg total) under the tongue every 5 (five) minutes x 3 doses as needed for chest pain. 25 tablet 2  . oxyCODONE (OXY IR/ROXICODONE) 5 MG immediate release tablet Take 1-2 tablets (5-10 mg total) by mouth every 3 (three) hours as needed for breakthrough pain.  60 tablet 0  . spironolactone (ALDACTONE) 25 MG tablet Take 0.5 tablets (12.5 mg total) by mouth at bedtime. 45 tablet 3  . zolpidem (AMBIEN) 10 MG tablet Take 10 mg by mouth at bedtime as needed for sleep.     No current facility-administered medications for this visit.     LABS/IMAGING: No results found for this or any previous visit (from the past 48 hour(s)). No results found.  VITALS: BP (!) 76/50 (BP Location: Right Arm, Patient Position: Sitting, Cuff Size: Normal)   Pulse 70   Ht 5\' 9"  (1.753 m)   Wt 133 lb (60.3 kg)   SpO2 99%   BMI 19.64 kg/m   EXAM: General appearance: alert and no distress Neck: no carotid bruit and no JVD Lungs: clear to auscultation bilaterally Heart: regular rate and rhythm, S1, S2 normal, no murmur, click, rub or gallop Abdomen: soft, non-tender; bowel sounds normal; no masses,  no organomegaly Extremities: left shoulder droop, walks with a limp on the right knee Pulses: 2+ and symmetric Skin: Skin color, texture, turgor normal. No rashes or lesions Neurologic: Grossly normal Psych: Somewhat of a flat mood, affect  EKG: Normal sinus rhythm at 70, anterolateral infarct pattern  ASSESSMENT: 1. Coronary artery disease status post anterior STEMI in 09/2015 (s/p DES to proximal LAD) 2. Ischemic cardiomyopathy EF 35-40% 3. Low normal blood pressure 4. Significant right knee OA - s/p right TKR 5. ?Anxiety/depression   PLAN: 1.   Mr. Hisle continues to have a number of somatic complaints including generalized pain, fatigue and significant concern about his heart. He said today I'm not sure if my heart would allow me to be be a live much longer. I quickly discounted this and feel that perhaps some of his symptoms are related to either anxiety or depression and/or preoccupation with his cardiovascular condition. I do not feel his diet is particularly healthy. He eats very little and is fact is borderline underweight today with a BMI of 19. This could  be contributing to his hypotension. Unfortunately, while he would benefit from the addition of the beta blocker and Aldactone, blood pressure is very low today. He seems to be symptomatic and this is been a trend for the last week. We will discontinue his carvedilol today. Continue low-dose Aldactone and follow-up with heart failure is established in February. Plans for cardiopulmonary  exercise testing are in the works.  Pixie Casino, MD, [facility] Attending Cardiologist [place] C Winna Golla 12/14/2016, 9:55 AM

## 2016-12-16 ENCOUNTER — Other Ambulatory Visit: Payer: Self-pay | Admitting: Physician Assistant

## 2016-12-16 DIAGNOSIS — M25561 Pain in right knee: Secondary | ICD-10-CM

## 2016-12-16 DIAGNOSIS — M159 Polyosteoarthritis, unspecified: Secondary | ICD-10-CM

## 2016-12-16 DIAGNOSIS — M150 Primary generalized (osteo)arthritis: Principal | ICD-10-CM

## 2016-12-16 DIAGNOSIS — G8929 Other chronic pain: Secondary | ICD-10-CM

## 2016-12-19 ENCOUNTER — Other Ambulatory Visit: Payer: Self-pay | Admitting: Physician Assistant

## 2016-12-19 DIAGNOSIS — M25561 Pain in right knee: Secondary | ICD-10-CM

## 2016-12-19 DIAGNOSIS — M159 Polyosteoarthritis, unspecified: Secondary | ICD-10-CM

## 2016-12-19 DIAGNOSIS — M150 Primary generalized (osteo)arthritis: Principal | ICD-10-CM

## 2016-12-19 DIAGNOSIS — G8929 Other chronic pain: Secondary | ICD-10-CM

## 2017-01-01 ENCOUNTER — Ambulatory Visit (HOSPITAL_COMMUNITY)
Admission: RE | Admit: 2017-01-01 | Discharge: 2017-01-01 | Disposition: A | Discharge status: Home / Self Care | Payer: BLUE CROSS/BLUE SHIELD | Source: Ambulatory Visit | Attending: Cardiology | Admitting: Cardiology

## 2017-01-01 ENCOUNTER — Encounter (HOSPITAL_COMMUNITY): Payer: Self-pay

## 2017-01-01 VITALS — BP 84/62 | HR 84 | Ht 69.0 in | Wt 136.8 lb

## 2017-01-01 DIAGNOSIS — I252 Old myocardial infarction: Secondary | ICD-10-CM | POA: Diagnosis not present

## 2017-01-01 DIAGNOSIS — I2510 Atherosclerotic heart disease of native coronary artery without angina pectoris: Secondary | ICD-10-CM | POA: Diagnosis not present

## 2017-01-01 DIAGNOSIS — Z7982 Long term (current) use of aspirin: Secondary | ICD-10-CM | POA: Insufficient documentation

## 2017-01-01 DIAGNOSIS — Z9861 Coronary angioplasty status: Secondary | ICD-10-CM | POA: Diagnosis not present

## 2017-01-01 DIAGNOSIS — I5022 Chronic systolic (congestive) heart failure: Secondary | ICD-10-CM | POA: Insufficient documentation

## 2017-01-01 DIAGNOSIS — Z79899 Other long term (current) drug therapy: Secondary | ICD-10-CM | POA: Diagnosis not present

## 2017-01-01 DIAGNOSIS — Z5189 Encounter for other specified aftercare: Secondary | ICD-10-CM | POA: Insufficient documentation

## 2017-01-01 MED ORDER — SPIRONOLACTONE 25 MG PO TABS: 12.5000 mg | ORAL_TABLET | Freq: Every day | ORAL | 0 refills | Status: DC

## 2017-01-01 MED ORDER — CARVEDILOL 3.125 MG PO TABS: 1.5630 mg | ORAL_TABLET | Freq: Two times a day (BID) | ORAL | 2 refills | Status: DC

## 2017-01-01 MED ORDER — LISINOPRIL 2.5 MG PO TABS: 2.5000 mg | ORAL_TABLET | Freq: Every day | ORAL | 6 refills | Status: DC

## 2017-01-01 NOTE — Patient Instructions (Signed)
Start taking Spironolactone in the AM and lisinopril in the PM  Restart Coreg 1.563 mg (0.5 Tablet) Two times Daily  Cardio Pulmonary test has been ordered for you.   Follow up in 3 months

## 2017-01-01 NOTE — Progress Notes (Signed)
PCP: Gwenlyn Perking Cardiology: Dr. [name] HF Cardiology: Dr. Aundra Dubin  61 yo with history of CAD s/p anterior MI in 11/16 and ischemic cardiomyopathy presents for CHF clinic evaluation.  Patient was in good health until 11/16.  At that time, he had anterior STEMI treated with DES to proximal LAD.  Echo at the time showed EF 30-35%.  Since then, he has had 2 further echoes, both with EF 35-40%, most recently in 9/17.    He has severe OA R knee and is now s/p R TKR with Dr. Ronnie Derby with no complication.   He presents today for regular follow up.  When last seen by Dr [name], SBP in 70s though asymptomatic.  Coreg was stopped.  He continues on low dose lisinopril and spironolactone.  He feels generally good.  No dizziness/lightheadedness.  BP generally 90s/60s when he checks at home.  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  No dyspnea walking up a flight of steps.  Rides an exercise bike for exercise and has even tried some slow jogging.    ECG (9/17): NSR, old anterior MI  Labs (2/17): LDL 46 Labs (11/17): K 3.9, creatinine 0.92, LFTs normal Labs (1/18): K 4.2, creatinine 0.92  PMH: 1. Hyperlipidemia 2. OA: right knee. Right TKR 11/17.  3. CAD: Anterior STEMI 11/16.  Totally occluded LAD on cath => DES to proximal LAD.   4. Chronic systolic CHF: Ischemic cardiomyopathy. - 11/16 echo: EF 30-35%  - 2/17 echo: EF 35-40% with regional WMAs.  - 9/17 echo: EF 35-40%, anteroseptal/apical akinesis.  SH: Lives in [place], works as Optometrist, married, quit smoking > 15 years ago, occasional ETOH.   FH: No premature CAD  ROS: All systems reviewed and negative except as per HPI.   Current Outpatient Prescriptions  Medication Sig Dispense Refill  . aspirin 81 MG chewable tablet Chew 1 tablet (81 mg total) by mouth daily.    Marland Kitchen atorvastatin (LIPITOR) 40 MG tablet Take 1 tablet (40 mg total) by mouth daily at 6 PM. 30 tablet 6  . clopidogrel (PLAVIX) 75 MG  tablet Take 1 tablet (75 mg total) by mouth daily. 30 tablet 6  . Coenzyme Q10 (COQ10 PO) Take 1 tablet by mouth daily.    Marland Kitchen doxycycline (VIBRAMYCIN) 50 MG capsule Take 50 mg by mouth 2 (two) times daily as needed (for sinus pressure).   3  . gabapentin (NEURONTIN) 300 MG capsule TAKE 1 CAPSULE AT MIDDAY AND 3 CAPSULES AT BEDTIME 120 capsule 0  . lisinopril (PRINIVIL,ZESTRIL) 2.5 MG tablet Take 1 tablet (2.5 mg total) by mouth at bedtime. 30 tablet 6  . methocarbamol (ROBAXIN) 500 MG tablet Take 1 tablet (500 mg total) by mouth every 6 (six) hours as needed for muscle spasms. 60 tablet 0  . Multiple Vitamin (MULTIVITAMIN) tablet Take 1 tablet by mouth daily.    . nitroGLYCERIN (NITROSTAT) 0.4 MG SL tablet Place 1 tablet (0.4 mg total) under the tongue every 5 (five) minutes x 3 doses as needed for chest pain. 25 tablet 2  . oxyCODONE (OXY IR/ROXICODONE) 5 MG immediate release tablet Take 1-2 tablets (5-10 mg total) by mouth every 3 (three) hours as needed for breakthrough pain. 60 tablet 0  . spironolactone (ALDACTONE) 25 MG tablet Take 0.5 tablets (12.5 mg total) by mouth daily. Take in the AM 45 tablet 0  . zolpidem (AMBIEN) 10 MG tablet Take 10 mg by mouth at bedtime as needed for sleep.    Marland Kitchen  carvedilol (COREG) 3.125 MG tablet Take 0.5 tablets (1.563 mg total) by mouth 2 (two) times daily. 30 tablet 2   No current facility-administered medications for this encounter.    BP (!) 84/62 (BP Location: Right Arm, Patient Position: Sitting, Cuff Size: Normal)   Pulse 84   Ht 5\' 9"  (1.753 m)   Wt 136 lb 12.8 oz (62.1 kg)   SpO2 100%   BMI 20.20 kg/m    Wt Readings from Last 3 Encounters:  01/01/17 136 lb 12.8 oz (62.1 kg)  12/14/16 133 lb (60.3 kg)  11/20/16 138 lb (62.6 kg)   General: Well appearing. NAD.  Neck: No JVD, no thyromegaly or thyroid nodule.  Lungs: Clear to auscultation bilaterally with normal respiratory effort. CV: Nondisplaced PMI.  Heart regular S1/S2, no S3/S4, no murmur.  No carotid bruit.  Normal pedal pulses.  Abdomen: Soft, NT, ND, no HSM. No bruits or masses. +BS  Skin: Intact without lesions or rashes.  Neurologic: Alert and oriented x 3.  Psych: Normal affect. Extremities: No clubbing or cyanosis. Trace edema in R leg. S/p R TKR.  HEENT: Normal.   Assessment/Plan: 1. CAD: s/p anterior MI 11/16 with DES to proximal LAD.  No recurrent chest pain.   - He will continue ASA 81 and atorvastatin 40 daily.  - DAPT score = 2.  Now on Plavix 75 mg daily which I would have him continue long-term as long as he does not have bleeding issues. Stopped Brilinta prior to knee surgery. 2. Chronic systolic CHF:  Echo 0000000 with EF 35-40%.  He has been in this range since his MI.  Ischemic cardiomyopathy. Volume status stable on exam.  Soft BP limits medication titration though pt denies lightheadedness.  NYHA class II symptoms.  - Continue lisinopril 2.5 mg daily, take at night.  - Continue spironolactone 12.5 daily, take in am.   - Will try Coreg again but will use 1/2 3.125 mg tab bid.     - EF just out of range for ICD and QRS not wide.   - Will arrange for CPX, think he has recovered enough from knee surgery.    Followup in 3 months.    Loralie Champagne 01/01/2017

## 2017-01-07 ENCOUNTER — Ambulatory Visit (HOSPITAL_COMMUNITY): Payer: BLUE CROSS/BLUE SHIELD | Attending: Internal Medicine

## 2017-01-07 DIAGNOSIS — I5022 Chronic systolic (congestive) heart failure: Secondary | ICD-10-CM | POA: Insufficient documentation

## 2017-01-08 DIAGNOSIS — I5022 Chronic systolic (congestive) heart failure: Secondary | ICD-10-CM | POA: Diagnosis not present

## 2017-01-17 ENCOUNTER — Other Ambulatory Visit: Payer: Self-pay | Admitting: Internal Medicine

## 2017-01-19 NOTE — Telephone Encounter (Signed)
lisinopril (PRINIVIL,ZESTRIL) 2.5 MG tablet 30 tablet 6 01/01/2017    Sig - Route: Take 1 tablet (2.5 mg total) by mouth at bedtime. - Oral   E-Prescribing Status: Receipt confirmed by pharmacy (01/01/2017 9:25 AM EST)   Pharmacy   [facility], [place]

## 2017-03-01 ENCOUNTER — Other Ambulatory Visit: Payer: Self-pay | Admitting: Internal Medicine

## 2017-03-21 ENCOUNTER — Other Ambulatory Visit (HOSPITAL_COMMUNITY): Payer: Self-pay | Admitting: Cardiology

## 2017-03-31 ENCOUNTER — Telehealth (HOSPITAL_COMMUNITY): Payer: Self-pay

## 2017-03-31 ENCOUNTER — Encounter (HOSPITAL_COMMUNITY): Payer: Self-pay

## 2017-03-31 ENCOUNTER — Ambulatory Visit (HOSPITAL_COMMUNITY)
Admission: RE | Admit: 2017-03-31 | Discharge: 2017-03-31 | Disposition: A | Discharge status: Home / Self Care | Payer: BLUE CROSS/BLUE SHIELD | Source: Ambulatory Visit | Attending: Internal Medicine | Admitting: Internal Medicine

## 2017-03-31 VITALS — BP 90/55 | HR 60 | Ht 69.0 in | Wt 142.9 lb

## 2017-03-31 DIAGNOSIS — I252 Old myocardial infarction: Secondary | ICD-10-CM | POA: Diagnosis not present

## 2017-03-31 DIAGNOSIS — Z7902 Long term (current) use of antithrombotics/antiplatelets: Secondary | ICD-10-CM | POA: Diagnosis not present

## 2017-03-31 DIAGNOSIS — I5022 Chronic systolic (congestive) heart failure: Secondary | ICD-10-CM | POA: Diagnosis not present

## 2017-03-31 DIAGNOSIS — I2510 Atherosclerotic heart disease of native coronary artery without angina pectoris: Secondary | ICD-10-CM | POA: Diagnosis not present

## 2017-03-31 DIAGNOSIS — E785 Hyperlipidemia, unspecified: Secondary | ICD-10-CM | POA: Insufficient documentation

## 2017-03-31 DIAGNOSIS — I2582 Chronic total occlusion of coronary artery: Secondary | ICD-10-CM | POA: Insufficient documentation

## 2017-03-31 DIAGNOSIS — Z7982 Long term (current) use of aspirin: Secondary | ICD-10-CM | POA: Insufficient documentation

## 2017-03-31 DIAGNOSIS — Z79899 Other long term (current) drug therapy: Secondary | ICD-10-CM | POA: Insufficient documentation

## 2017-03-31 DIAGNOSIS — I255 Ischemic cardiomyopathy: Secondary | ICD-10-CM | POA: Diagnosis not present

## 2017-03-31 DIAGNOSIS — R079 Chest pain, unspecified: Secondary | ICD-10-CM

## 2017-03-31 DIAGNOSIS — Z9861 Coronary angioplasty status: Secondary | ICD-10-CM

## 2017-03-31 DIAGNOSIS — I209 Angina pectoris, unspecified: Secondary | ICD-10-CM

## 2017-03-31 LAB — BASIC METABOLIC PANEL
ANION GAP: 6 (ref 5–15)
BUN: 14 mg/dL (ref 6–20)
CHLORIDE: 103 mmol/L (ref 101–111)
CO2: 30 mmol/L (ref 22–32)
Calcium: 9.3 mg/dL (ref 8.9–10.3)
Creatinine, Ser: 0.94 mg/dL (ref 0.61–1.24)
GFR calc Af Amer: >60 mL/min (ref >60)
GLUCOSE: 96 mg/dL (ref 65–99)
POTASSIUM: 4.5 mmol/L (ref 3.5–5.1)
SODIUM: 139 mmol/L (ref 135–145)

## 2017-03-31 LAB — LIPID PANEL
CHOL/HDL RATIO: 2.1 ratio
Cholesterol: 92 mg/dL (ref 0–200)
HDL: 43 mg/dL (ref >40)
LDL Cholesterol: 38 mg/dL (ref 0–99)
Triglycerides: 57 mg/dL (ref <150)
VLDL: 11 mg/dL (ref 0–40)

## 2017-03-31 MED ORDER — RANITIDINE HCL 75 MG PO TABS: 75.0000 mg | ORAL_TABLET | ORAL | Status: DC | PRN

## 2017-03-31 NOTE — Telephone Encounter (Signed)
Encounter complete. 

## 2017-03-31 NOTE — Patient Instructions (Signed)
Increase Carvedilol to 3.125 mg (1 tab) Twice daily   Can get Zantac over the counter to take as needed for acid reflux  Labs today  Your physician has requested that you have en exercise stress myoview. For further information please visit [email or web address]. Please follow instruction sheet, as given.  Your physician recommends that you schedule a follow-up appointment in: 1 month

## 2017-03-31 NOTE — Progress Notes (Signed)
PCP: Gwenlyn Perking Cardiology: Dr. [name] HF Cardiology: Dr. Aundra Dubin  61 yo with history of CAD s/p anterior MI in 11/16 and ischemic cardiomyopathy presents for CHF clinic evaluation.  Patient was in good health until 11/16.  At that time, he had anterior STEMI treated with DES to proximal LAD.  Echo at the time showed EF 30-35%.  Since then, he has had 2 further echoes, both with EF 35-40%, most recently in 9/17.    He has severe OA R knee and is now s/p R TKR with Dr. Ronnie Derby with no complication.   He presents today for regular follow up.  SBP in the 90s at home.  No lightheadedness or falls.  Weight is up about 6 lbs but he has been eating more.  Tries to stay active.  No dyspnea walking on flat ground. He can climb to his 3rd floor office with only mild fatigue.  On Sunday, he ate at [employer] for lunch then went to the zoo.  While walking around the zoo, he noted mild left-sided chest aching.  It was like prior heartburn, but he thinks that NTG may have helped. On Monday, he ate [place] and gravy then went out to work in the yard.  Again, he had mild left-sided CP and took an NTG.  Again, hard to distinguish from GERD for him.    Labs (2/17): LDL 46 Labs (11/17): K 3.9, creatinine 0.92, LFTs normal Labs (1/18): K 4.2, creatinine 0.92  PMH: 1. Hyperlipidemia 2. OA: right knee. Right TKR 11/17.  3. CAD: Anterior STEMI 11/16.  Totally occluded LAD on cath => DES to proximal LAD.   4. Chronic systolic CHF: Ischemic cardiomyopathy. - 11/16 echo: EF 30-35%  - 2/17 echo: EF 35-40% with regional WMAs.  - 9/17 echo: EF 35-40%, anteroseptal/apical akinesis. - CPX (2/18): peak VO2 19.6, VE/VCO2 slope 30, RER 1.34.  Mild to moderate HF limitation.   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   FH: No premature CAD  ROS: All systems reviewed and negative except as per HPI.   Current Outpatient Prescriptions  Medication Sig Dispense Refill  .  aspirin 81 MG chewable tablet Chew 1 tablet (81 mg total) by mouth daily.    Marland Kitchen atorvastatin (LIPITOR) 40 MG tablet TAKE 1 TABLET (40 MG TOTAL) BY MOUTH DAILY AT 6 PM. 90 tablet 3  . carvedilol (COREG) 3.125 MG tablet Take 1 tablet (3.125 mg total) by mouth 2 (two) times daily. 60 tablet 2  . clopidogrel (PLAVIX) 75 MG tablet Take 1 tablet (75 mg total) by mouth daily. 30 tablet 6  . Coenzyme Q10 (COQ10 PO) Take 1 tablet by mouth daily.    Marland Kitchen gabapentin (NEURONTIN) 300 MG capsule TAKE 1 CAPSULE AT MIDDAY AND 3 CAPSULES AT BEDTIME 120 capsule 0  . lisinopril (PRINIVIL,ZESTRIL) 2.5 MG tablet Take 1 tablet (2.5 mg total) by mouth at bedtime. 30 tablet 6  . Multiple Vitamin (MULTIVITAMIN) tablet Take 1 tablet by mouth daily.    . nitroGLYCERIN (NITROSTAT) 0.4 MG SL tablet Place 1 tablet (0.4 mg total) under the tongue every 5 (five) minutes x 3 doses as needed for chest pain. 25 tablet 2  . spironolactone (ALDACTONE) 25 MG tablet Take 0.5 tablets (12.5 mg total) by mouth daily. Take in the AM 45 tablet 0  . zolpidem (AMBIEN) 10 MG tablet Take 10 mg by mouth at bedtime as needed for sleep.    Marland Kitchen doxycycline (  VIBRAMYCIN) 50 MG capsule Take 50 mg by mouth 2 (two) times daily as needed (for sinus pressure).   3  . methocarbamol (ROBAXIN) 500 MG tablet Take 1 tablet (500 mg total) by mouth every 6 (six) hours as needed for muscle spasms. (Patient not taking: Reported on 03/31/2017) 60 tablet 0  . oxyCODONE (OXY IR/ROXICODONE) 5 MG immediate release tablet Take 1-2 tablets (5-10 mg total) by mouth every 3 (three) hours as needed for breakthrough pain. (Patient not taking: Reported on 03/31/2017) 60 tablet 0  . ranitidine (ZANTAC 75) 75 MG tablet Take 1 tablet (75 mg total) by mouth as needed for heartburn.     No current facility-administered medications for this encounter.    BP (!) 90/55 (BP Location: Left Arm, Patient Position: Sitting, Cuff Size: Normal)   Pulse 60   Ht 5\' 9"  (1.753 m)   Wt 142 lb 14.1 oz  (64.8 kg)   SpO2 100%   BMI 21.10 kg/m    Wt Readings from Last 3 Encounters:  03/31/17 142 lb 14.1 oz (64.8 kg)  01/01/17 136 lb 12.8 oz (62.1 kg)  12/14/16 133 lb (60.3 kg)   General: Well appearing. NAD.  Neck: JVP 7 cm, no thyromegaly or thyroid nodule.  Lungs: CTAB CV: Nondisplaced PMI.  Heart regular S1/S2, no S3/S4, no murmur. No carotid bruit.  Normal pedal pulses.  Abdomen: Soft, NT, ND, no HSM. No bruits or masses. +BS  Skin: Intact without lesions or rashes.  Neurologic: Alert and oriented x 3.  Psych: Normal affect. Extremities: No clubbing or cyanosis. Trace edema in R leg. S/p R TKR.  HEENT: Normal.   Assessment/Plan: 1. CAD: s/p anterior MI 11/16 with DES to proximal LAD.  He has had mild chest pain on the left recently, some typical and some atypical features.   - He will continue ASA 81 and atorvastatin 40 daily.  - DAPT score = 2.  Now on Plavix 75 mg daily which I would have him continue long-term as long as he does not have bleeding issues.  - Given chest pain, I am going to arrange for ETT-Cardiolite for risk stratification.  2. Chronic systolic CHF:  Echo 5/83 with EF 35-40%.  He has been in this range since his MI.  Ischemic cardiomyopathy. Volume status stable on exam.  Soft BP limits medication titration though pt denies lightheadedness.  NYHA class II symptoms. CPX with mild to moderate limitation due to HF.  - Continue lisinopril 2.5 mg daily, take at night.  - Continue spironolactone 12.5 daily, take in am.   - Increase Coreg from 1/2 tablet 3.125 mg bid to full tablet 3.125 mg bid.     - EF just out of range for ICD and QRS not wide.  Repeat echo in 9/18.    Followup in 1 month if Cardiolite is ok.  Loralie Champagne 03/31/2017

## 2017-04-02 ENCOUNTER — Ambulatory Visit (HOSPITAL_COMMUNITY)
Admission: RE | Admit: 2017-04-02 | Discharge: 2017-04-02 | Disposition: A | Discharge status: Home / Self Care | Payer: BLUE CROSS/BLUE SHIELD | Source: Ambulatory Visit | Attending: Cardiovascular Disease | Admitting: Cardiovascular Disease

## 2017-04-02 DIAGNOSIS — I5189 Other ill-defined heart diseases: Secondary | ICD-10-CM | POA: Diagnosis not present

## 2017-04-02 DIAGNOSIS — Z87891 Personal history of nicotine dependence: Secondary | ICD-10-CM | POA: Insufficient documentation

## 2017-04-02 DIAGNOSIS — I252 Old myocardial infarction: Secondary | ICD-10-CM | POA: Diagnosis not present

## 2017-04-02 DIAGNOSIS — I2510 Atherosclerotic heart disease of native coronary artery without angina pectoris: Secondary | ICD-10-CM | POA: Diagnosis not present

## 2017-04-02 DIAGNOSIS — I5022 Chronic systolic (congestive) heart failure: Secondary | ICD-10-CM | POA: Insufficient documentation

## 2017-04-02 DIAGNOSIS — Z8249 Family history of ischemic heart disease and other diseases of the circulatory system: Secondary | ICD-10-CM | POA: Diagnosis not present

## 2017-04-02 DIAGNOSIS — R079 Chest pain, unspecified: Secondary | ICD-10-CM

## 2017-04-02 MED ORDER — TECHNETIUM TC 99M TETROFOSMIN IV KIT
31.1000 | PACK | Freq: Once | INTRAVENOUS | Status: AC | PRN
  Administered 2017-04-02: 31.1 via INTRAVENOUS
  Filled 2017-04-02: qty 32

## 2017-04-02 MED ORDER — TECHNETIUM TC 99M TETROFOSMIN IV KIT
10.3000 | PACK | Freq: Once | INTRAVENOUS | Status: AC | PRN
  Administered 2017-04-02: 10.3 via INTRAVENOUS
  Filled 2017-04-02: qty 11

## 2017-04-05 ENCOUNTER — Other Ambulatory Visit: Payer: Self-pay | Admitting: Internal Medicine

## 2017-04-05 LAB — MYOCARDIAL PERFUSION IMAGING
CHL CUP NUCLEAR SRS: 22
CHL CUP RESTING HR STRESS: 54 {beats}/min
CHL RATE OF PERCEIVED EXERTION: 18
CSEPEDS: 0 s
CSEPHR: 90 %
CSEPPHR: 142 {beats}/min
Estimated workload: 8.7 METS
Exercise duration (min): 8 min
LV sys vol: 83 mL
LVDIAVOL: 129 mL (ref 62–150)
MPHR: 157 {beats}/min
SDS: 1
SSS: 23
TID: 1.25

## 2017-04-05 NOTE — Telephone Encounter (Signed)
Rx(s) sent to pharmacy electronically.  

## 2017-04-06 ENCOUNTER — Telehealth (HOSPITAL_COMMUNITY): Payer: Self-pay | Admitting: *Deleted

## 2017-04-06 DIAGNOSIS — I429 Cardiomyopathy, unspecified: Secondary | ICD-10-CM

## 2017-04-06 NOTE — Telephone Encounter (Signed)
Notes recorded by Scarlette Calico, RN on 04/06/2017 at 2:02 PM EDT Pt aware and agreeable, he denies any further CP/discomfort, will let us know if he has any more, he is agreeable to cMRI, will get approved w/[employer] and call him to schedule

## 2017-04-06 NOTE — Telephone Encounter (Signed)
-----   Message from Larey Dresser, MD sent at 04/05/2017  9:57 AM EDT ----- There is a large perfusion defect but this suggests prior MI rather than ischemia.  There is minimal peri-infarct ischemia.  This likely represents his prior MI.  Please give him study results, see if he has had more chest pain.  If he still is having chest pain episodes, would be reasonable for relook cath.  If no further chest pain, may have just been GERD (was atypical) and would not cath.  His EF was 35% which is just on the border for ICD.  I think that he is going to need an MRI to quantify EF more exactly as given anterior scar, he is at somewhat higher risk for SCD.  Would arrange cardiac MRI for EF for ICD evaluation.

## 2017-04-07 ENCOUNTER — Telehealth: Payer: Self-pay | Admitting: Cardiology

## 2017-04-07 NOTE — Telephone Encounter (Signed)
Called patient to find out when he wanted his cardiac MRI.  Patient would like a morning appointment.  Message sent to technician.  Already have precert information.

## 2017-04-08 ENCOUNTER — Encounter: Payer: Self-pay | Admitting: Cardiology

## 2017-04-08 ENCOUNTER — Telehealth: Payer: Self-pay | Admitting: Cardiology

## 2017-04-08 NOTE — Telephone Encounter (Signed)
Called patient and gave him the date, time and location of cardiac MRI.  Patient wanted to know the cost of the MRI.  I gave him the [facility] phone number regarding the cost of the procedure.

## 2017-04-21 ENCOUNTER — Ambulatory Visit (HOSPITAL_COMMUNITY)
Admission: RE | Admit: 2017-04-21 | Discharge: 2017-04-21 | Disposition: A | Discharge status: Home / Self Care | Payer: BLUE CROSS/BLUE SHIELD | Source: Ambulatory Visit | Attending: Cardiology | Admitting: Cardiology

## 2017-04-21 DIAGNOSIS — I429 Cardiomyopathy, unspecified: Secondary | ICD-10-CM | POA: Diagnosis present

## 2017-04-21 DIAGNOSIS — I255 Ischemic cardiomyopathy: Secondary | ICD-10-CM | POA: Diagnosis not present

## 2017-04-21 MED ORDER — GADOBENATE DIMEGLUMINE 529 MG/ML IV SOLN
25.0000 mL | Freq: Once | INTRAVENOUS | Status: AC
  Administered 2017-04-21: 25 mL via INTRAVENOUS

## 2017-05-21 ENCOUNTER — Ambulatory Visit (HOSPITAL_COMMUNITY)
Admission: RE | Admit: 2017-05-21 | Discharge: 2017-05-21 | Disposition: A | Discharge status: Home / Self Care | Payer: BLUE CROSS/BLUE SHIELD | Source: Ambulatory Visit | Attending: Cardiology | Admitting: Cardiology

## 2017-05-21 ENCOUNTER — Encounter (HOSPITAL_COMMUNITY): Payer: Self-pay

## 2017-05-21 VITALS — BP 95/55 | HR 55 | Wt 143.5 lb

## 2017-05-21 DIAGNOSIS — I2510 Atherosclerotic heart disease of native coronary artery without angina pectoris: Secondary | ICD-10-CM | POA: Insufficient documentation

## 2017-05-21 DIAGNOSIS — Z9861 Coronary angioplasty status: Secondary | ICD-10-CM | POA: Diagnosis not present

## 2017-05-21 DIAGNOSIS — I209 Angina pectoris, unspecified: Secondary | ICD-10-CM | POA: Diagnosis not present

## 2017-05-21 DIAGNOSIS — I255 Ischemic cardiomyopathy: Secondary | ICD-10-CM | POA: Insufficient documentation

## 2017-05-21 DIAGNOSIS — I5022 Chronic systolic (congestive) heart failure: Secondary | ICD-10-CM | POA: Insufficient documentation

## 2017-05-21 DIAGNOSIS — I252 Old myocardial infarction: Secondary | ICD-10-CM | POA: Diagnosis not present

## 2017-05-21 DIAGNOSIS — Z87891 Personal history of nicotine dependence: Secondary | ICD-10-CM | POA: Diagnosis not present

## 2017-05-21 DIAGNOSIS — E785 Hyperlipidemia, unspecified: Secondary | ICD-10-CM | POA: Diagnosis not present

## 2017-05-21 DIAGNOSIS — Z7982 Long term (current) use of aspirin: Secondary | ICD-10-CM | POA: Diagnosis not present

## 2017-05-21 DIAGNOSIS — Z7902 Long term (current) use of antithrombotics/antiplatelets: Secondary | ICD-10-CM | POA: Diagnosis not present

## 2017-05-21 MED ORDER — SPIRONOLACTONE 25 MG PO TABS: 25.0000 mg | ORAL_TABLET | Freq: Every day | ORAL | 1 refills | Status: DC

## 2017-05-21 MED ORDER — TRAMADOL HCL 50 MG PO TABS: 50.0000 mg | ORAL_TABLET | Freq: Two times a day (BID) | ORAL | 0 refills | Status: DC | PRN

## 2017-05-21 NOTE — Patient Instructions (Signed)
Increase Spironolactone to 25 mg daily  OK to take Tramadol for pain, we have given you a 1 month supply, further refills will need to come from your primary care doctor  Labs in 10 days  We will contact you in 4 months to schedule your next appointment.

## 2017-05-22 NOTE — Progress Notes (Signed)
PCP: Gwenlyn Perking Cardiology: Dr. [name] HF Cardiology: Dr. Aundra Dubin  61 yo with history of CAD s/p anterior MI in 11/16 and ischemic cardiomyopathy presents for CHF clinic evaluation.  Patient was in good health until 11/16.  At that time, he had anterior STEMI treated with DES to proximal LAD.  Echo at the time showed EF 30-35%.  Since then, he has had 2 further echoes, both with EF 35-40%, most recently in 9/17.    He has severe OA R knee and is now s/p R TKR with Dr. Ronnie Derby with no complication.   He noted atypical chest pain in 5/18.  Cardiolite showed infarction without significant ischemia.  Cardiac MRI in 5/18 showed EF 44% with peri-apical scar.   He presents today for regular follow up.  SBP in the 90s at home.  No lightheadedness or falls.  Weight is stable.  No dyspnea walking on flat ground. He can climb to his 3rd floor office with only mild fatigue.  Short of breath with heavy lifting.  No further chest pain.   Labs (2/17): LDL 46 Labs (11/17): K 3.9, creatinine 0.92, LFTs normal Labs (1/18): K 4.2, creatinine 0.92 Labs (5/18): K 4.5, creatinine 0.94, LDL 38, HDL 43  PMH: 1. Hyperlipidemia 2. OA: right knee. Right TKR 11/17.  3. CAD: Anterior STEMI 11/16.  Totally occluded LAD on cath => DES to proximal LAD.   - Cardiolite (5/18): EF 35%, large LAD territory infarction with no ischemia.  4. Chronic systolic CHF: Ischemic cardiomyopathy. - 11/16 echo: EF 30-35%  - 2/17 echo: EF 35-40% with regional WMAs.  - 9/17 echo: EF 35-40%, anteroseptal/apical akinesis. - CPX (2/18): peak VO2 19.6, VE/VCO2 slope 30, RER 1.34.  Mild to moderate HF limitation.  - Cardiac MRI (5/18): EF 44%, mid anteroseptal, mid anterior, and peri-apical scar/akinesis, LGE pattern not suggestive of viability.  Mild RV dilation with normal systolic function.   SH: Lives in [place], works as Optometrist, married, quit smoking > 15 years ago, occasional ETOH.   FH: No premature CAD  ROS: All systems reviewed  and negative except as per HPI.   Current Outpatient Prescriptions  Medication Sig Dispense Refill  . aspirin 81 MG chewable tablet Chew 1 tablet (81 mg total) by mouth daily.    Marland Kitchen atorvastatin (LIPITOR) 40 MG tablet TAKE 1 TABLET (40 MG TOTAL) BY MOUTH DAILY AT 6 PM. 90 tablet 3  . carvedilol (COREG) 3.125 MG tablet Take 1 tablet (3.125 mg total) by mouth 2 (two) times daily. 60 tablet 2  . clopidogrel (PLAVIX) 75 MG tablet Take 1 tablet (75 mg total) by mouth daily. 30 tablet 6  . Coenzyme Q10 (COQ10 PO) Take 1 tablet by mouth daily.    Marland Kitchen gabapentin (NEURONTIN) 300 MG capsule TAKE 1 CAPSULE AT MIDDAY AND 3 CAPSULES AT BEDTIME 120 capsule 0  . lisinopril (PRINIVIL,ZESTRIL) 2.5 MG tablet Take 1 tablet (2.5 mg total) by mouth at bedtime. 30 tablet 6  . Multiple Vitamin (MULTIVITAMIN) tablet Take 1 tablet by mouth daily.    . ranitidine (ZANTAC 75) 75 MG tablet Take 1 tablet (75 mg total) by mouth as needed for heartburn.    . spironolactone (ALDACTONE) 25 MG tablet Take 1 tablet (25 mg total) by mouth daily. Take in the AM 90 tablet 1  . zolpidem (AMBIEN) 10 MG tablet Take 10 mg by mouth at bedtime as needed for sleep.    Marland Kitchen doxycycline (VIBRAMYCIN) 50 MG capsule Take 50 mg by mouth 2 (  two) times daily as needed (for sinus pressure).   3  . methocarbamol (ROBAXIN) 500 MG tablet Take 1 tablet (500 mg total) by mouth every 6 (six) hours as needed for muscle spasms. (Patient not taking: Reported on 03/31/2017) 60 tablet 0  . NITROSTAT 0.4 MG SL tablet PLACE 1 TABLET (0.4 MG TOTAL) UNDER THE TONGUE EVERY 5 (FIVE) MINUTES X 3 DOSES AS NEEDED FOR CHEST (Patient not taking: Reported on 05/21/2017) 25 tablet 2  . oxyCODONE (OXY IR/ROXICODONE) 5 MG immediate release tablet Take 1-2 tablets (5-10 mg total) by mouth every 3 (three) hours as needed for breakthrough pain. (Patient not taking: Reported on 03/31/2017) 60 tablet 0  . traMADol (ULTRAM) 50 MG tablet Take 1 tablet (50 mg total) by mouth every 12 (twelve)  hours as needed. 30 tablet 0   No current facility-administered medications for this encounter.    BP (!) 95/55   Pulse (!) 55   Wt 143 lb 8 oz (65.1 kg)   SpO2 100%   BMI 21.19 kg/m    Wt Readings from Last 3 Encounters:  05/21/17 143 lb 8 oz (65.1 kg)  04/02/17 142 lb (64.4 kg)  03/31/17 142 lb 14.1 oz (64.8 kg)   General: Well appearing. NAD.  Neck: JVP not elevated, no thyromegaly or thyroid nodule.  Lungs: Clear to auscultation bilaterally.  CV: Nondisplaced PMI.  Heart regular S1/S2, no S3/S4, no murmur. No carotid bruit.  Normal pedal pulses.  Abdomen: Soft, NT, ND, no HSM. No bruits or masses. +BS  Skin: Intact without lesions or rashes.  Neurologic: Alert and oriented x 3.  Psych: Normal affect. Extremities: No clubbing or cyanosis. Trace edema in R leg. S/p R TKR.  HEENT: Normal.   Assessment/Plan: 1. CAD: s/p anterior MI 11/16 with DES to proximal LAD.  Cardiolite in 5/18 without significant ischemia. - He will continue ASA 81 and atorvastatin 40 daily.  - DAPT score = 2.  Now on Plavix 75 mg daily which I would have him continue long-term as long as he does not have bleeding issues.  2. Chronic systolic CHF:  Echo 7/42 with EF 35-40%.  He has been in this range since his MI.  Ischemic cardiomyopathy. Volume status stable on exam.  Soft BP limits medication titration though pt denies lightheadedness.  NYHA class II symptoms. CPX with mild to moderate limitation due to HF.  Cardiac MRI with peri-apical scar and EF 44%, discussed this with patient today.  - Continue lisinopril 2.5 mg daily, take at night.  - I will have him increase spironolactone to 25 mg daily.  BMET 10 days.    - Continue Coreg 3.125 mg bid.     - EF out of range for ICD.    Followup in 4 months  Loralie Champagne 05/22/2017

## 2017-05-29 ENCOUNTER — Other Ambulatory Visit: Payer: Self-pay | Admitting: Cardiology

## 2017-06-11 ENCOUNTER — Other Ambulatory Visit (HOSPITAL_COMMUNITY): Payer: Self-pay | Admitting: Cardiology

## 2017-07-07 ENCOUNTER — Other Ambulatory Visit (HOSPITAL_COMMUNITY): Payer: Self-pay | Admitting: Cardiology

## 2017-07-20 ENCOUNTER — Other Ambulatory Visit (HOSPITAL_COMMUNITY): Payer: Self-pay | Admitting: Internal Medicine

## 2017-08-26 ENCOUNTER — Other Ambulatory Visit: Payer: Self-pay | Admitting: Internal Medicine

## 2017-08-31 ENCOUNTER — Other Ambulatory Visit (HOSPITAL_COMMUNITY): Payer: Self-pay | Admitting: Cardiology

## 2017-11-09 ENCOUNTER — Other Ambulatory Visit (HOSPITAL_COMMUNITY): Payer: Self-pay | Admitting: Cardiology

## 2017-11-19 ENCOUNTER — Other Ambulatory Visit (HOSPITAL_COMMUNITY): Payer: Self-pay | Admitting: Internal Medicine

## 2017-12-22 ENCOUNTER — Ambulatory Visit (HOSPITAL_COMMUNITY)
Admission: RE | Admit: 2017-12-22 | Discharge: 2017-12-22 | Disposition: A | Discharge status: Home / Self Care | Payer: BLUE CROSS/BLUE SHIELD | Source: Ambulatory Visit | Attending: Cardiology | Admitting: Cardiology

## 2017-12-22 ENCOUNTER — Encounter (HOSPITAL_COMMUNITY): Payer: Self-pay | Admitting: Cardiology

## 2017-12-22 VITALS — BP 89/61 | HR 62 | Wt 145.0 lb

## 2017-12-22 DIAGNOSIS — Z9861 Coronary angioplasty status: Secondary | ICD-10-CM | POA: Diagnosis not present

## 2017-12-22 DIAGNOSIS — Z9889 Other specified postprocedural states: Secondary | ICD-10-CM | POA: Diagnosis not present

## 2017-12-22 DIAGNOSIS — Z79899 Other long term (current) drug therapy: Secondary | ICD-10-CM | POA: Diagnosis not present

## 2017-12-22 DIAGNOSIS — I2510 Atherosclerotic heart disease of native coronary artery without angina pectoris: Secondary | ICD-10-CM

## 2017-12-22 DIAGNOSIS — I5022 Chronic systolic (congestive) heart failure: Secondary | ICD-10-CM | POA: Diagnosis not present

## 2017-12-22 DIAGNOSIS — Z7902 Long term (current) use of antithrombotics/antiplatelets: Secondary | ICD-10-CM | POA: Diagnosis not present

## 2017-12-22 DIAGNOSIS — Z96651 Presence of right artificial knee joint: Secondary | ICD-10-CM | POA: Diagnosis not present

## 2017-12-22 DIAGNOSIS — Z87891 Personal history of nicotine dependence: Secondary | ICD-10-CM | POA: Diagnosis not present

## 2017-12-22 DIAGNOSIS — I255 Ischemic cardiomyopathy: Secondary | ICD-10-CM | POA: Diagnosis not present

## 2017-12-22 DIAGNOSIS — I252 Old myocardial infarction: Secondary | ICD-10-CM | POA: Insufficient documentation

## 2017-12-22 DIAGNOSIS — I2582 Chronic total occlusion of coronary artery: Secondary | ICD-10-CM | POA: Diagnosis not present

## 2017-12-22 DIAGNOSIS — Z7982 Long term (current) use of aspirin: Secondary | ICD-10-CM | POA: Insufficient documentation

## 2017-12-22 DIAGNOSIS — E785 Hyperlipidemia, unspecified: Secondary | ICD-10-CM | POA: Insufficient documentation

## 2017-12-22 LAB — BASIC METABOLIC PANEL
ANION GAP: 8 (ref 5–15)
BUN: 13 mg/dL (ref 6–20)
CHLORIDE: 105 mmol/L (ref 101–111)
CO2: 26 mmol/L (ref 22–32)
Calcium: 9.2 mg/dL (ref 8.9–10.3)
Creatinine, Ser: 0.89 mg/dL (ref 0.61–1.24)
GFR calc Af Amer: >60 mL/min (ref >60)
Glucose, Bld: 84 mg/dL (ref 65–99)
POTASSIUM: 4.2 mmol/L (ref 3.5–5.1)
SODIUM: 139 mmol/L (ref 135–145)

## 2017-12-22 LAB — LIPID PANEL
CHOL/HDL RATIO: 2.7 ratio
CHOLESTEROL: 106 mg/dL (ref 0–200)
HDL: 40 mg/dL — AB (ref >40)
LDL Cholesterol: 54 mg/dL (ref 0–99)
Triglycerides: 60 mg/dL (ref <150)
VLDL: 12 mg/dL (ref 0–40)

## 2017-12-22 NOTE — Progress Notes (Signed)
PCP: Dr. Coletta Memos Cardiology: Dr. [name] HF Cardiology: Dr. Aundra Dubin  62 yo with history of CAD s/p anterior MI in 11/16 and ischemic cardiomyopathy presents for CHF clinic evaluation.  Patient was in good health until 11/16.  At that time, he had anterior STEMI treated with DES to proximal LAD.  Echo at the time showed EF 30-35%.  Since then, he has had 2 further echoes, both with EF 35-40%, most recently in 9/17.    He has severe OA R knee and is now s/p R TKR with Dr. Ronnie Derby with no complication.   He noted atypical chest pain in 5/18.  Cardiolite showed infarction without significant ischemia.  Cardiac MRI in 5/18 showed EF 44% with peri-apical scar.   He presents today for followup of CAD and CHF.  Weight is stable.  BP continues to run low, generally SBP in 90s.  No lightheadedness or falls.  No exertional chest pain.  He had some left upper chest pain in 12/18 that was not exertional and may have been brought on by stress.  No recurrence for a number of weeks now.  He is not short of breath with exertion, no problems walking up 2 flights of stairs at work.  Stamina not as good as it was in the past.   No BRBPR/melena.   ECG (personally reviewed): NSR, LAFB, old ASMI.   Labs (2/17): LDL 46 Labs (11/17): K 3.9, creatinine 0.92, LFTs normal Labs (1/18): K 4.2, creatinine 0.92 Labs (5/18): K 4.5, creatinine 0.94, LDL 38, HDL 43 Labs (8/18): K 4.1, creatinine .0.91  PMH: 1. Hyperlipidemia 2. OA: right knee. Right TKR 11/17.  3. CAD: Anterior STEMI 11/16.  Totally occluded LAD on cath => DES to proximal LAD.   - Cardiolite (5/18): EF 35%, large LAD territory infarction with no ischemia.  4. Chronic systolic CHF: Ischemic cardiomyopathy. - 11/16 echo: EF 30-35%  - 2/17 echo: EF 35-40% with regional WMAs.  - 9/17 echo: EF 35-40%, anteroseptal/apical akinesis. - CPX (2/18): peak VO2 19.6, VE/VCO2 slope 30, RER 1.34.  Mild to moderate HF limitation.  - Cardiac MRI (5/18): EF 44%, mid  anteroseptal, mid anterior, and peri-apical scar/akinesis, LGE pattern not suggestive of viability.  Mild RV dilation with normal systolic function.   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   FH: No premature CAD  ROS: All systems reviewed and negative except as per HPI.   Current Outpatient Medications  Medication Sig Dispense Refill  . aspirin 81 MG chewable tablet Chew 1 tablet (81 mg total) by mouth daily.    Marland Kitchen atorvastatin (LIPITOR) 40 MG tablet TAKE 1 TABLET (40 MG TOTAL) BY MOUTH DAILY AT 6 PM. 90 tablet 3  . carvedilol (COREG) 3.125 MG tablet TAKE 1 TABLET (3.125 MG TOTAL) BY MOUTH 2 (TWO) TIMES DAILY. 60 tablet 2  . clopidogrel (PLAVIX) 75 MG tablet TAKE 1 TABLET BY MOUTH EVERY DAY 30 tablet 11  . Coenzyme Q10 (COQ10 PO) Take 1 tablet by mouth daily.    Marland Kitchen doxycycline (VIBRAMYCIN) 50 MG capsule Take 50 mg by mouth 2 (two) times daily as needed (for sinus pressure).   3  . gabapentin (NEURONTIN) 300 MG capsule TAKE 1 CAPSULE AT MIDDAY AND 3 CAPSULES AT BEDTIME 120 capsule 0  . lisinopril (PRINIVIL,ZESTRIL) 2.5 MG tablet TAKE 1 TABLET (2.5 MG TOTAL) BY MOUTH AT BEDTIME. 30 tablet 6  . methocarbamol (ROBAXIN) 500 MG tablet Take 1 tablet (500  mg total) by mouth every 6 (six) hours as needed for muscle spasms. 60 tablet 0  . Multiple Vitamin (MULTIVITAMIN) tablet Take 1 tablet by mouth daily.    Marland Kitchen NITROSTAT 0.4 MG SL tablet PLACE 1 TABLET (0.4 MG TOTAL) UNDER THE TONGUE EVERY 5 (FIVE) MINUTES X 3 DOSES AS NEEDED FOR CHEST 25 tablet 2  . oxyCODONE (OXY IR/ROXICODONE) 5 MG immediate release tablet Take 1-2 tablets (5-10 mg total) by mouth every 3 (three) hours as needed for breakthrough pain. 60 tablet 0  . ranitidine (ZANTAC 75) 75 MG tablet Take 1 tablet (75 mg total) by mouth as needed for heartburn.    . spironolactone (ALDACTONE) 25 MG tablet TAKE 1 TABLET BY MOUTH EVERY MORNING. 90 tablet 1  . traMADol (ULTRAM) 50 MG tablet  Take 1 tablet (50 mg total) by mouth every 12 (twelve) hours as needed. 30 tablet 0  . zolpidem (AMBIEN) 10 MG tablet Take 10 mg by mouth at bedtime as needed for sleep.     No current facility-administered medications for this encounter.    BP (!) 89/61 (BP Location: Left Arm, Patient Position: Sitting, Cuff Size: Normal)   Pulse 62   Wt 145 lb (65.8 kg)   SpO2 100%   BMI 21.41 kg/m    Wt Readings from Last 3 Encounters:  12/22/17 145 lb (65.8 kg)  05/21/17 143 lb 8 oz (65.1 kg)  04/02/17 142 lb (64.4 kg)   General: NAD Neck: No JVD, no thyromegaly or thyroid nodule.  Lungs: Clear to auscultation bilaterally with normal respiratory effort. CV: Nondisplaced PMI.  Heart regular S1/S2, no S3/S4, no murmur.  No peripheral edema.  No carotid bruit.  Normal pedal pulses.  Abdomen: Soft, nontender, no hepatosplenomegaly, no distention.  Skin: Intact without lesions or rashes.  Neurologic: Alert and oriented x 3.  Psych: Normal affect. Extremities: No clubbing or cyanosis.  HEENT: Normal.   Assessment/Plan: 1. CAD: s/p anterior MI 11/16 with DES to proximal LAD.  Cardiolite in 5/18 without significant ischemia. No exertional chest pain. He had some atypical chest pain last month but no problems recently.  - He will continue ASA 81 and atorvastatin 40 daily.  - Now on Plavix 75 mg daily which I would have him continue long-term as long as he does not have bleeding issues.  - Would hold off on repeat stress test unless he has further chest pain.   2. Chronic systolic CHF:  Echo 6/14 with EF 35-40%.  He has been in this range since his MI.  Ischemic cardiomyopathy.  Prior CPX with mild to moderate limitation due to HF.  Cardiac MRI with peri-apical scar and EF 44%. Volume status stable on exam.  Soft BP limits medication titration though pt denies lightheadedness.  NYHA class II symptoms. - Continue lisinopril 2.5 mg daily, take at night.  - Continue spironolactone 25 mg daily.     -  Continue Coreg 3.125 mg bid.     - EF out of range for ICD.   - Will get repeat echo in 5/19.   - BMET today.  3. Hyperlipidemia: Check lipids today.   Followup in 4 months with echo.   Loralie Champagne 12/22/2017

## 2017-12-22 NOTE — Patient Instructions (Addendum)
Labs drawn today (if we do not call you, then your lab work was stable)   Your physician has requested that you have an echocardiogram. Echocardiography is a painless test that uses sound waves to create images of your heart. It provides your doctor with information about the size and shape of your heart and how well your heart's chambers and valves are working. This procedure takes approximately one hour. There are no restrictions for this procedure. (This will be for May 2019)    Your physician recommends that you schedule a follow-up appointment in: 6 months with Dr. Aundra Dubin  (we will call you)

## 2017-12-23 ENCOUNTER — Encounter (HOSPITAL_COMMUNITY): Payer: Self-pay

## 2018-01-16 ENCOUNTER — Other Ambulatory Visit (HOSPITAL_COMMUNITY): Payer: Self-pay | Admitting: Internal Medicine

## 2018-02-01 ENCOUNTER — Telehealth (HOSPITAL_COMMUNITY): Payer: Self-pay | Admitting: *Deleted

## 2018-02-01 NOTE — Telephone Encounter (Signed)
Patient walked into clinic complaining of side & upper back pain.  Stated it was nothing series but that he would come by and have [place] look at it.  Leona Carry with scheduling came and told me patient was wanting to be seen.  I advised her to have him call his PCP as this was not cardiac related.    Patient told her that he didn't have a PCP and she then directed him to an urgent care or ER as this is not cardiac related.

## 2018-02-03 ENCOUNTER — Emergency Department (HOSPITAL_COMMUNITY)
Admission: EM | Admit: 2018-02-03 | Discharge: 2018-02-04 | Disposition: A | Discharge status: Home / Self Care | Payer: BLUE CROSS/BLUE SHIELD | Attending: Emergency Medicine | Admitting: Emergency Medicine

## 2018-02-03 ENCOUNTER — Other Ambulatory Visit: Payer: Self-pay

## 2018-02-03 DIAGNOSIS — Z79899 Other long term (current) drug therapy: Secondary | ICD-10-CM | POA: Diagnosis not present

## 2018-02-03 DIAGNOSIS — Z7902 Long term (current) use of antithrombotics/antiplatelets: Secondary | ICD-10-CM | POA: Insufficient documentation

## 2018-02-03 DIAGNOSIS — Z87891 Personal history of nicotine dependence: Secondary | ICD-10-CM | POA: Insufficient documentation

## 2018-02-03 DIAGNOSIS — Z96651 Presence of right artificial knee joint: Secondary | ICD-10-CM | POA: Insufficient documentation

## 2018-02-03 DIAGNOSIS — I2510 Atherosclerotic heart disease of native coronary artery without angina pectoris: Secondary | ICD-10-CM | POA: Diagnosis not present

## 2018-02-03 DIAGNOSIS — I5022 Chronic systolic (congestive) heart failure: Secondary | ICD-10-CM | POA: Diagnosis not present

## 2018-02-03 DIAGNOSIS — R072 Precordial pain: Secondary | ICD-10-CM | POA: Insufficient documentation

## 2018-02-03 DIAGNOSIS — Z8679 Personal history of other diseases of the circulatory system: Secondary | ICD-10-CM | POA: Insufficient documentation

## 2018-02-03 DIAGNOSIS — Z955 Presence of coronary angioplasty implant and graft: Secondary | ICD-10-CM | POA: Diagnosis not present

## 2018-02-03 DIAGNOSIS — Z7982 Long term (current) use of aspirin: Secondary | ICD-10-CM | POA: Diagnosis not present

## 2018-02-03 DIAGNOSIS — R079 Chest pain, unspecified: Secondary | ICD-10-CM | POA: Diagnosis present

## 2018-02-03 HISTORY — DX: Atherosclerotic heart disease of native coronary artery without angina pectoris: I25.10

## 2018-02-04 ENCOUNTER — Encounter (HOSPITAL_COMMUNITY): Payer: Self-pay | Admitting: Emergency Medicine

## 2018-02-04 ENCOUNTER — Emergency Department (HOSPITAL_COMMUNITY): Payer: BLUE CROSS/BLUE SHIELD

## 2018-02-04 DIAGNOSIS — R072 Precordial pain: Secondary | ICD-10-CM

## 2018-02-04 LAB — CBC
HEMATOCRIT: 39.1 % (ref 39.0–52.0)
Hemoglobin: 13.5 g/dL (ref 13.0–17.0)
MCH: 31.1 pg (ref 26.0–34.0)
MCHC: 34.5 g/dL (ref 30.0–36.0)
MCV: 90.1 fL (ref 78.0–100.0)
PLATELETS: 275 10*3/uL (ref 150–400)
RBC: 4.34 MIL/uL (ref 4.22–5.81)
RDW: 11.8 % (ref 11.5–15.5)
WBC: 7.8 10*3/uL (ref 4.0–10.5)

## 2018-02-04 LAB — BASIC METABOLIC PANEL
Anion gap: 7 (ref 5–15)
BUN: 18 mg/dL (ref 6–20)
CHLORIDE: 104 mmol/L (ref 101–111)
CO2: 26 mmol/L (ref 22–32)
Calcium: 9.2 mg/dL (ref 8.9–10.3)
Creatinine, Ser: 0.95 mg/dL (ref 0.61–1.24)
Glucose, Bld: 119 mg/dL — ABNORMAL HIGH (ref 65–99)
POTASSIUM: 4.2 mmol/L (ref 3.5–5.1)
SODIUM: 137 mmol/L (ref 135–145)

## 2018-02-04 LAB — PROTIME-INR
INR: 0.99
PROTHROMBIN TIME: 13 s (ref 11.4–15.2)

## 2018-02-04 LAB — I-STAT TROPONIN, ED
Troponin i, poc: 0 ng/mL (ref 0.00–0.08)
Troponin i, poc: 0 ng/mL (ref 0.00–0.08)

## 2018-02-04 MED ORDER — IOPAMIDOL (ISOVUE-370) INJECTION 76%
INTRAVENOUS | Status: AC
  Administered 2018-02-04: 100 mL
  Filled 2018-02-04: qty 100

## 2018-02-04 MED ORDER — IOPAMIDOL (ISOVUE-370) INJECTION 76%
INTRAVENOUS | Status: AC
  Filled 2018-02-04: qty 100

## 2018-02-04 MED ORDER — KETOROLAC TROMETHAMINE 30 MG/ML IJ SOLN
30.0000 mg | Freq: Once | INTRAMUSCULAR | Status: AC
  Administered 2018-02-04: 30 mg via INTRAVENOUS
  Filled 2018-02-04: qty 1

## 2018-02-04 MED ORDER — ASPIRIN 81 MG PO CHEW
324.0000 mg | CHEWABLE_TABLET | Freq: Once | ORAL | Status: AC
  Administered 2018-02-04: 324 mg via ORAL
  Filled 2018-02-04: qty 4

## 2018-02-04 MED ORDER — TRAMADOL HCL 50 MG PO TABS
100.0000 mg | ORAL_TABLET | Freq: Once | ORAL | Status: AC
  Administered 2018-02-04: 100 mg via ORAL
  Filled 2018-02-04: qty 2

## 2018-02-04 MED ORDER — CLOPIDOGREL BISULFATE 75 MG PO TABS
75.0000 mg | ORAL_TABLET | Freq: Once | ORAL | Status: AC
  Administered 2018-02-04: 75 mg via ORAL
  Filled 2018-02-04: qty 1

## 2018-02-04 MED ORDER — TRAMADOL HCL 50 MG PO TABS: 100.0000 mg | ORAL_TABLET | Freq: Four times a day (QID) | ORAL | 0 refills | Status: DC | PRN

## 2018-02-04 NOTE — ED Notes (Signed)
ED Provider at bedside. 

## 2018-02-04 NOTE — ED Notes (Signed)
Cardiology at bedside.

## 2018-02-04 NOTE — ED Notes (Signed)
Pt reports he is awaiting eval from Dr Aundra Dubin per cardiology.

## 2018-02-04 NOTE — ED Notes (Signed)
Pt pacing in waiting room holding chest.

## 2018-02-04 NOTE — ED Provider Notes (Signed)
[facility]ERGENCY DEPARTMENT Provider Note   CSN: [id number] Arrival date & time: 02/03/18  2339     History   Chief Complaint Chief Complaint  Patient presents with  . Chest Pain    HPI Joseph Riddle is a 62 y.o. male.  HPI Patient has been getting episodes of left sided chest pain that have come and gone several times over the past 5 days.  First episode was on Sunday.  He reports the pain is sharp and on the left side of his chest.  He also experiences pain up behind his shoulder blade.  He reports on Sunday he went home and rested and felt better.  He had started with sedentary activity.  He did not have recurrence for another 1-2 days.  He then had a similar episode and again had fairly sharp pain on the left side of the chest and into the shoulder.  He has felt shortness of breath with the symptoms.  No nausea vomiting or sweating.  Patient reports he did go to his cardiologist's office on Monday but was not seen actually by the cardiologist.  He reports staff at the office advised him that it did not sound cardiac and he should follow-up with his PCP or the emergency department.  She reports he did do a follow-up with his PCP and initially they did not find any specific problems.  Last night however pain came back and was again quite severe in the left side of the chest.  Patient has known history of coronary artery disease with an LAD stent placed in 2016.  Patient takes daily aspirin and Plavix. Past Medical History:  Diagnosis Date  . Arthritis   . Coronary artery disease   . Dyslipidemia 09/2015  . Dyspnea   . Ischemic cardiomyopathy 09/2015   EF 30-35 percent by echo 10/07/2015  . STEMI (ST elevation myocardial infarction) ([place]) 10/05/2015   DES LAD, residual diagonal 90%, medical Rx    Patient Active Problem List   Diagnosis Date Noted  . Chronic systolic CHF (congestive heart failure) ([place]) 12/14/2016  . Other fatigue 12/14/2016  . S/P total knee  replacement 10/12/2016  . Preoperative cardiovascular examination 06/29/2016  . Chronic pain of right knee 06/29/2016  . Cardiomyopathy, ischemic 10/08/2015  . Hypotension 10/08/2015  . CAD S/P LAD DES 10/05/15   . Ischemic chest pain   . Dyslipidemia   . ST elevation (STEMI) myocardial infarction involving left anterior descending coronary artery ([place]) 10/05/2015  . ST elevation myocardial infarction (STEMI) involving left anterior descending (LAD) coronary artery with complication ([place]) 03/50/0938  . Neck pain on left side 06/19/2014  . Chest pain 06/19/2014  . Palpitations 06/19/2014  . DDD (degenerative disc disease), cervical 07/13/2012  . Degenerative joint disease involving multiple joints 03/10/2012  . Insomnia 03/10/2012    Past Surgical History:  Procedure Laterality Date  . CARDIAC CATHETERIZATION N/A 10/05/2015   Procedure: Left Heart Cath;  Surgeon: Peter M Martinique, MD;  Location: [facility];  Service: Cardiovascular;  Laterality: N/A;  . CARDIAC CATHETERIZATION  10/05/2015   Procedure: Coronary Stent Intervention;  Surgeon: Peter M Martinique, MD;  Location: [facility];  Service: Cardiovascular;;  . COLONOSCOPY    . CORONARY STENT PLACEMENT    . FOOT SURGERY Left 10/2013   Dr. [name]  . MENISCUS REPAIR    . TOTAL KNEE ARTHROPLASTY Right 10/12/2016   Procedure: RIGHT TOTAL KNEE ARTHROPLASTY;  Surgeon: Vickey Huger, MD;  Location: [facility];  Service: Orthopedics;  Laterality: Right;       Home Medications    Prior to Admission medications   Medication Sig Start Date End Date Taking? Authorizing Provider  acetaminophen (TYLENOL) 500 MG tablet Take 1,000 mg by mouth every 6 (six) hours as needed for mild pain.   Yes [provider]  aspirin 81 MG chewable tablet Chew 1 tablet (81 mg total) by mouth daily. 10/08/15  Yes Kilroy, Luke K, PA-C  atorvastatin (LIPITOR) 40 MG tablet TAKE 1 TABLET (40 MG TOTAL) BY MOUTH DAILY AT 6 PM. 03/01/17  Yes  Hilty, Nadean Corwin, MD  carvedilol (COREG) 3.125 MG tablet TAKE 1 TABLET (3.125 MG TOTAL) BY MOUTH 2 (TWO) TIMES DAILY. 11/19/17 02/17/18 Yes Larey Dresser, MD  clopidogrel (PLAVIX) 75 MG tablet TAKE 1 TABLET BY MOUTH EVERY DAY 08/30/17  Yes Larey Dresser, MD  Coenzyme Q10 (COQ10 PO) Take 1 tablet by mouth daily.   Yes [provider]  doxycycline (VIBRAMYCIN) 50 MG capsule Take 50 mg by mouth 2 (two) times daily as needed (for sinus pressure).  09/07/16  Yes [provider]  gabapentin (NEURONTIN) 300 MG capsule TAKE 1 CAPSULE AT MIDDAY AND 3 CAPSULES AT BEDTIME 11/20/16  Yes English, Stephanie D, PA  ibuprofen (ADVIL,MOTRIN) 200 MG tablet Take 400 mg by mouth every 6 (six) hours as needed for moderate pain.   Yes [provider]  lisinopril (PRINIVIL,ZESTRIL) 2.5 MG tablet TAKE 1 TABLET BY MOUTH AT BEDTIME. 01/18/18  Yes Larey Dresser, MD  Multiple Vitamin (MULTIVITAMIN) tablet Take 1 tablet by mouth daily.   Yes [provider]  NITROSTAT 0.4 MG SL tablet PLACE 1 TABLET (0.4 MG TOTAL) UNDER THE TONGUE EVERY 5 (FIVE) MINUTES X 3 DOSES AS NEEDED FOR CHEST 04/05/17  Yes Hilty, Nadean Corwin, MD  oxymetazoline (MUCINEX NASAL SPRAY FULL FORCE) 0.05 % nasal spray Place 2 sprays into both nostrils as needed for congestion.   Yes [provider]  sodium chloride (MURO 128) 5 % ophthalmic ointment Place 1 application into both eyes at bedtime.   Yes [provider]  spironolactone (ALDACTONE) 25 MG tablet TAKE 1 TABLET BY MOUTH EVERY MORNING. 11/09/17  Yes Bensimhon, Shaune Pascal, MD  zolpidem (AMBIEN) 10 MG tablet Take 10 mg by mouth at bedtime as needed for sleep.   Yes [provider]  methocarbamol (ROBAXIN) 500 MG tablet Take 1 tablet (500 mg total) by mouth every 6 (six) hours as needed for muscle spasms. Patient not taking: Reported on 02/04/2018 10/13/16   Donia Ast, PA  oxyCODONE (OXY IR/ROXICODONE) 5 MG immediate release tablet  Take 1-2 tablets (5-10 mg total) by mouth every 3 (three) hours as needed for breakthrough pain. Patient not taking: Reported on 02/04/2018 10/13/16   Donia Ast, PA  ranitidine (ZANTAC 75) 75 MG tablet Take 1 tablet (75 mg total) by mouth as needed for heartburn. Patient not taking: Reported on 02/04/2018 03/31/17   Larey Dresser, MD  traMADol (ULTRAM) 50 MG tablet Take 1 tablet (50 mg total) by mouth every 12 (twelve) hours as needed. Patient not taking: Reported on 02/04/2018 05/21/17   Larey Dresser, MD  traMADol Veatrice Bourbon) 50 MG tablet Take 2 tablets (100 mg total) by mouth every 6 (six) hours as needed. 1 to 2 tablets every 6 hours as needed for pain 02/04/18   Charlesetta Shanks, MD    Family History Family History  Problem Relation Age of  Onset  . Heart disease Maternal Grandfather   . Rheumatic fever Mother   . Heart disease Father     Social History Social History   Tobacco Use  . Smoking status: Former Smoker    Years: 15.00    Types: Cigarettes    Last attempt to quit: 07/24/2000    Years since quitting: 17.5  . Smokeless tobacco: Never Used  Substance Use Topics  . Alcohol use: Yes    Alcohol/week: 0.0 oz    Comment: very occasionally - mixed drinks  . Drug use: No     Allergies   No known allergies   Review of Systems Review of Systems 10 Systems reviewed and are negative for acute change except as noted in the HPI.   Physical Exam Updated Vital Signs BP 110/72   Pulse 65   Temp 98.7 F (37.1 C) (Oral)   Resp 16   SpO2 99%   Physical Exam  Constitutional: He is oriented to person, place, and time. He appears well-developed and well-nourished. No distress.  She does report pain at this time.  He is however alert with no respiratory distress and generally well appearance.  HENT:  Head: Normocephalic and atraumatic.  Mouth/Throat: Oropharynx is clear and moist.  Eyes: Conjunctivae and EOM are normal.  Neck: Neck supple.  Cardiovascular: Normal  rate, regular rhythm, normal heart sounds and intact distal pulses.  No murmur heard. Pulmonary/Chest: Effort normal and breath sounds normal. No respiratory distress.  Chest wall normal visual inspection and palpation.  Pain is not reproducible.  I cannot appreciate any rash.  Abdominal: Soft. He exhibits no distension. There is no tenderness. There is no guarding.  Musculoskeletal: Normal range of motion. He exhibits no edema or tenderness.  Neurological: He is alert and oriented to person, place, and time. No cranial nerve deficit. He exhibits normal muscle tone. Coordination normal.  Skin: Skin is warm and dry.  Psychiatric: He has a normal mood and affect.  Nursing note and vitals reviewed.    ED Treatments / Results  Labs (all labs ordered are listed, but only abnormal results are displayed) Labs Reviewed  BASIC METABOLIC PANEL - Abnormal; Notable for the following components:      Result Value   Glucose, Bld 119 (*)    All other components within normal limits  CBC  PROTIME-INR  I-STAT TROPONIN, ED  I-STAT TROPONIN, ED    EKG  EKG Interpretation  Date/Time:  Thursday February 03 2018 23:48:28 EDT Ventricular Rate:  67 PR Interval:  148 QRS Duration: 76 QT Interval:  362 QTC Calculation: 382 R Axis:   157 Text Interpretation:  Normal sinus rhythm Low voltage QRS Anterolateral infarct , age undetermined Abnormal ECG agree. no sig interval change c/w previous Confirmed by Charlesetta Shanks [phone]) on 02/04/2018 7:43:39 AM       Radiology Dg Chest 2 View  Result Date: 02/04/2018 CLINICAL DATA:  Chest pain EXAM: CHEST - 2 VIEW COMPARISON:  None. FINDINGS: The heart size and mediastinal contours are within normal limits. Both lungs are clear. The visualized skeletal structures are unremarkable. IMPRESSION: No active cardiopulmonary disease. Electronically Signed   By: Ulyses Jarred M.D.   On: 02/04/2018 01:33   Ct Angio Chest Pe W/cm &/or Wo Cm  Result Date:  02/04/2018 CLINICAL DATA:  Short of breath left chest pain.  Pleurisy EXAM: CT ANGIOGRAPHY CHEST WITH CONTRAST TECHNIQUE: Multidetector CT imaging of the chest was performed using the standard protocol during bolus administration of  intravenous contrast. Multiplanar CT image reconstructions and MIPs were obtained to evaluate the vascular anatomy. CONTRAST:  136mL ISOVUE-370 IOPAMIDOL (ISOVUE-370) INJECTION 76% COMPARISON:  Chest two-view 02/04/2018 FINDINGS: Cardiovascular: Negative for pulmonary embolism. Pulmonary arteries normal in caliber. Minimal calcification aortic arch. No aneurysm or dissection. Heart size normal. LAD coronary stent. Mediastinum/Nodes: Negative for mass or adenopathy Lungs/Pleura: Pleural based nodule right major fissure with triangular margins most consistent with a benign lesion such as a lymph node. This measures approximately 7 mm. Negative for infiltrate effusion or mass. Upper Abdomen: Layering gallstones in the gallbladder. No gallbladder wall thickening. Musculoskeletal: Negative Review of the MIP images confirms the above findings. IMPRESSION: Negative for pulmonary embolism.  No acute abnormality in the chest. Cholelithiasis Electronically Signed   By: Franchot Gallo M.D.   On: 02/04/2018 10:35    Procedures Procedures (including critical care time)  Medications Ordered in ED Medications  iopamidol (ISOVUE-370) 76 % injection (not administered)  ketorolac (TORADOL) 30 MG/ML injection 30 mg (30 mg Intravenous Given 02/04/18 0938)  aspirin chewable tablet 324 mg (324 mg Oral Given 02/04/18 0937)  clopidogrel (PLAVIX) tablet 75 mg (75 mg Oral Given 02/04/18 0938)  iopamidol (ISOVUE-370) 76 % injection (100 mLs  Contrast Given 02/04/18 1012)  traMADol (ULTRAM) tablet 100 mg (100 mg Oral Given 02/04/18 1212)     Initial Impression / Assessment and Plan / ED Course  I have reviewed the triage vital signs and the nursing notes.  Pertinent labs & imaging results that were  available during my care of the patient were reviewed by me and considered in my medical decision making (see chart for details).    Consult:(12:05) Dr. Radford Pax cardiology.  Final Clinical Impressions(s) / ED Diagnoses   Final diagnoses:  Precordial chest pain  History of coronary artery disease  Patient has sharp anterior left-sided chest pain.  This has been persistently coming and going for over 5 days.  Cardiac enzymes are negative for MI.  EKG was obtained while patient was having pain and no ischemic changes.  CT PE study negative for acute pertinent findings.  Cardiology has consulted due to the fact patient has history of LAD stent and do not feel this is cardiac at this time.  Patient is stable for discharge.  Plan will be to treat with tramadol for pleurisy\chest wall pain.  Patient is to follow-up with PCP.  ED Discharge Orders        Ordered    traMADol (ULTRAM) 50 MG tablet  Every 6 hours PRN     02/04/18 1556       Charlesetta Shanks, MD 02/04/18 1600

## 2018-02-04 NOTE — Consult Note (Addendum)
Cardiology Consult    Patient ID: Joseph Riddle MRN: [id number], DOB/AGE: 1956-02-14   Admit date: 02/03/2018 Date of Consult: 02/04/2018  Primary Physician: Barrie Lyme, FNP Primary Cardiologist: Dr. McLean/Dr. [name] Requesting Provider: Dr. Johnney Killian Reason for Consultation: Chest pain  Joseph Riddle is a 62 y.o. male who is being seen today for the evaluation of chest pain at the request of chest pain.   Patient Profile    62 yo male with PMH of CAD s/p MI ('52) with PCI/DES to the pLAD, residual 90% in 1st diag, Chronic systolic HF with EF 90-24%, HL who presented with left sided sharp chest pain.   Past Medical History   Past Medical History:  Diagnosis Date  . Arthritis   . Coronary artery disease   . Dyslipidemia 09/2015  . Dyspnea   . Ischemic cardiomyopathy 09/2015   EF 30-35 percent by echo 10/07/2015  . STEMI (ST elevation myocardial infarction) ([place]) 10/05/2015   DES LAD, residual diagonal 90%, medical Rx    Past Surgical History:  Procedure Laterality Date  . CARDIAC CATHETERIZATION N/A 10/05/2015   Procedure: Left Heart Cath;  Surgeon: Peter M Martinique, MD;  Location: [facility];  Service: Cardiovascular;  Laterality: N/A;  . CARDIAC CATHETERIZATION  10/05/2015   Procedure: Coronary Stent Intervention;  Surgeon: Peter M Martinique, MD;  Location: [facility];  Service: Cardiovascular;;  . COLONOSCOPY    . CORONARY STENT PLACEMENT    . FOOT SURGERY Left 10/2013   Dr. [name]  . MENISCUS REPAIR    . TOTAL KNEE ARTHROPLASTY Right 10/12/2016   Procedure: RIGHT TOTAL KNEE ARTHROPLASTY;  Surgeon: Vickey Huger, MD;  Location: [facility];  Service: Orthopedics;  Laterality: Right;     Allergies  Allergies  Allergen Reactions  . No Known Allergies     History of Present Illness    Mr. Joseph Riddle is a 62 yo male with PMH of CAD s/p MI ('16) with PCI/DES to the pLAD, residual 90% in 1st diag, Chronic systolic HF with EF 09-73%, HL. Has severe OA in his  right knee and is s/p TKR with Dr. Ronnie Derby in 2017. Reported atypical chest pain in 5/18 and had a stress test showing prior infarct without ischemia. He was referred to Dr. Aundra Dubin by Dr. [name] for his ICM. Sen for a cardiac MRI showing his EF was 44% with peri-apical scar.   He was last seen in the office on 12/22/17 for follow up. Noted medical therapy has been limited as his blood pressure runs in the upper 53G systolic. Reported some atypical chest pain back in 12/18. Though reported no exertional symptoms and was actually walking several flights of stairs a day while at work without symptoms. He was continued on ASA, plavix, statin, BB, ACEi and spiro. Last LDL (12/22/17) 54.   Reports he has been in his usual state of health until Sunday. 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 Sunday developed left sided upper back pain with radiation into his shoulder. Also had some sharp pain in his left rib area. Felt like this may have been related to him sitting for long periods of time. Had recurrent episode on Monday and again on Tuesday. Walked into the clinic with symptoms on Tuesday which sounds noncardiac in nature and told to follow up with PCP. He did on Wednesday, had labs that were normal (through [facility]). Had recurrent episode on Thursday with more sharp  pain in the left side of his chest/upper ribs. Took total of 4 SL nitro with not much improvement. Became concerned and presented to the ED. In relation to his previous MI, these symptoms are not very similar in nature. With MI in 2016 he did have significant chest pain, along with exertional symptoms and nausea.   In the ED his labs showed stable electrolytes, POC trop neg x2, Hgb 13.5. EKG showed with old anterolateral infarct. CXR neg. He was given tramadol and Toradol in the ED without total relief of symptoms. Did have a CT angio chest which was negative for PE but noted cholelithiasis.   Inpatient Medications    . iopamidol         Family History    Family History  Problem Relation Age of Onset  . Heart disease Maternal Grandfather   . Rheumatic fever Mother   . Heart disease Father     Social History    Social History   Socioeconomic History  . Marital status: Married    Spouse name: Not on file  . Number of children: 2  . Years of education: Not on file  . Highest education level: Not on file  Social Needs  . Financial resource strain: Not on file  . Food insecurity - worry: Not on file  . Food insecurity - inability: Not on file  . Transportation needs - medical: Not on file  . Transportation needs - non-medical: Not on file  Occupational History  . Occupation: Engineer, maintenance (IT)  Tobacco Use  . Smoking status: Former Smoker    Years: 15.00    Types: Cigarettes    Last attempt to quit: 07/24/2000    Years since quitting: 17.5  . Smokeless tobacco: Never Used  Substance and Sexual Activity  . Alcohol use: Yes    Alcohol/week: 0.0 oz    Comment: very occasionally - mixed drinks  . Drug use: No  . Sexual activity: No  Other Topics Concern  . Not on file  Social History Narrative   Married. Education: [employer]. Pt does not exercise.     Review of Systems    See HPI  All other systems reviewed and are otherwise negative except as noted above.  Physical Exam    Blood pressure 96/65, pulse 68, temperature 98.7 F (37.1 C), temperature source Oral, resp. rate 11, SpO2 97 %.  General: Pleasant, NAD Anxious.  Psych: Normal affect. Neuro: Alert and oriented X 3. Moves all extremities spontaneously. HEENT: Normal anicteric Neck: Supple without bruits or JVD. Lungs:  Resp regular and unlabored, CTA. No wheezes  Heart: RRR no s3, s4, or murmurs. Abdomen: Soft, non-tender, non-distended, BS + x 4.  Extremities: No clubbing, cyanosis or edema. DP/PT/Radials 2+ and equal bilaterally.  Labs    Troponin [facility] Test) Recent Labs    02/04/18 0952  TROPIPOC 0.00   No results for input(s):  CKTOTAL, CKMB, TROPONINI in the last 72 hours. Lab Results  Component Value Date   WBC 7.8 02/04/2018   HGB 13.5 02/04/2018   HCT 39.1 02/04/2018   MCV 90.1 02/04/2018   PLT 275 02/04/2018    Recent Labs  Lab 02/04/18 0011  NA 137  K 4.2  CL 104  CO2 26  BUN 18  CREATININE 0.95  CALCIUM 9.2  GLUCOSE 119*   Lab Results  Component Value Date   CHOL 106 12/22/2017   HDL 40 (L) 12/22/2017   LDLCALC 54 12/22/2017   TRIG 60 12/22/2017  No results found for: [facility]   Radiology Studies    Dg Chest 2 View  Result Date: 02/04/2018 CLINICAL DATA:  Chest pain EXAM: CHEST - 2 VIEW COMPARISON:  None. FINDINGS: The heart size and mediastinal contours are within normal limits. Both lungs are clear. The visualized skeletal structures are unremarkable. IMPRESSION: No active cardiopulmonary disease. Electronically Signed   By: Ulyses Jarred M.D.   On: 02/04/2018 01:33   Ct Angio Chest Pe W/cm &/or Wo Cm  Result Date: 02/04/2018 CLINICAL DATA:  Short of breath left chest pain.  Pleurisy EXAM: CT ANGIOGRAPHY CHEST WITH CONTRAST TECHNIQUE: Multidetector CT imaging of the chest was performed using the standard protocol during bolus administration of intravenous contrast. Multiplanar CT image reconstructions and MIPs were obtained to evaluate the vascular anatomy. CONTRAST:  14mL ISOVUE-370 IOPAMIDOL (ISOVUE-370) INJECTION 76% COMPARISON:  Chest two-view 02/04/2018 FINDINGS: Cardiovascular: Negative for pulmonary embolism. Pulmonary arteries normal in caliber. Minimal calcification aortic arch. No aneurysm or dissection. Heart size normal. LAD coronary stent. Mediastinum/Nodes: Negative for mass or adenopathy Lungs/Pleura: Pleural based nodule right major fissure with triangular margins most consistent with a benign lesion such as a lymph node. This measures approximately 7 mm. Negative for infiltrate effusion or mass. Upper Abdomen: Layering gallstones in the gallbladder. No gallbladder wall  thickening. Musculoskeletal: Negative Review of the MIP images confirms the above findings. IMPRESSION: Negative for pulmonary embolism.  No acute abnormality in the chest. Cholelithiasis Electronically Signed   By: Franchot Gallo M.D.   On: 02/04/2018 10:35    ECG & Cardiac Imaging    EKG:  The EKG was personally reviewed and demonstrates SR with old anterolateral infarct  Echo: 08/04/16  Study Conclusions  - Left ventricle: The cavity size was normal. Wall thickness was   normal. Systolic function was moderately reduced. The estimated   ejection fraction was in the range of 35% to 40%. There is   hypokinesis of the mid-apicalanteroseptal and apical myocardium.   Doppler parameters are consistent with abnormal left ventricular   relaxation (grade 1 diastolic dysfunction).  Impressions:  - Definity used; hypokinesis of the distal anteroseptal and apical   wall with overall moderalely reduced LV function; grade 1   diastolic dysfunction.   Assessment & Plan    62 yo male with PMH of CAD s/p MI ('52) with PCI/DES to the pLAD, residual 90% in 1st diag, Chronic systolic HF with EF 29-52%, HL who presented with left sided sharp chest pain.  1. Atypical chest pain: Symptoms started with upper back pain, then shoulder and sharp left sided chest/rib pain on Sunday. Attempted SL nitro last night without much relief. Denies any recent strenuous activity. EKG is non ischemic, and troponin is neg x2. Does have known residual CAD with 90% in diag, but hasn't had any exertional symptoms. Continue on ASA, plavix. Low suspicion this is cardiac in nature.   2. Chronic systolic HF: Known EF of 84-13% on last echo 9/17, but cardiac MRI 5/18 showed improvement to 44%. No HF symptoms reported, and weights have been stable. On Coreg, lisinopril, spiro  3. HL: on statin, last LDL in 1/19 was 54.   Barnet Pall, NP-C Pager [phone] 02/04/2018, 1:41 PM  Patient seen and examined with  the above-signed Advanced Practice Provider and/or Housestaff. I personally reviewed laboratory data, imaging studies and relevant notes. I independently examined the patient and formulated the important aspects of the plan. I have edited the note to reflect any of my changes or  salient points. I have personally discussed the plan with the patient and/or family.  Chest and side pain very atypical. ECG and troponins without evidence of ischemia. Doubt pain is ischemic in nature. Can f/u as outpatient. Continue ASA and statin.   Glori Bickers, MD  3:46 PM

## 2018-02-04 NOTE — ED Notes (Signed)
Pt reports pain now 2/10 however approx 5 mins ago was an 8/10. Pain intermittent and waxes and wanes

## 2018-02-04 NOTE — ED Notes (Signed)
Cardiology at the bedside.

## 2018-02-04 NOTE — ED Triage Notes (Signed)
Patient reports left chest pain with SOB onset Sunday , denies nausea or diaphoresis , his cardiologist is Dr. Trey Paula , history of CAD/coranary stent .

## 2018-02-06 ENCOUNTER — Other Ambulatory Visit (HOSPITAL_COMMUNITY): Payer: Self-pay | Admitting: Cardiology

## 2018-02-07 ENCOUNTER — Other Ambulatory Visit: Payer: Self-pay | Admitting: Internal Medicine

## 2018-03-28 ENCOUNTER — Other Ambulatory Visit: Payer: Self-pay

## 2018-03-28 ENCOUNTER — Ambulatory Visit (HOSPITAL_COMMUNITY): Payer: BLUE CROSS/BLUE SHIELD | Attending: Cardiovascular Disease

## 2018-03-28 DIAGNOSIS — Z87891 Personal history of nicotine dependence: Secondary | ICD-10-CM | POA: Diagnosis not present

## 2018-03-28 DIAGNOSIS — I422 Other hypertrophic cardiomyopathy: Secondary | ICD-10-CM | POA: Diagnosis not present

## 2018-03-28 DIAGNOSIS — E785 Hyperlipidemia, unspecified: Secondary | ICD-10-CM | POA: Diagnosis not present

## 2018-03-28 DIAGNOSIS — I517 Cardiomegaly: Secondary | ICD-10-CM | POA: Diagnosis not present

## 2018-03-28 DIAGNOSIS — R002 Palpitations: Secondary | ICD-10-CM | POA: Insufficient documentation

## 2018-03-28 DIAGNOSIS — I252 Old myocardial infarction: Secondary | ICD-10-CM | POA: Diagnosis not present

## 2018-03-28 DIAGNOSIS — I2510 Atherosclerotic heart disease of native coronary artery without angina pectoris: Secondary | ICD-10-CM | POA: Insufficient documentation

## 2018-03-28 DIAGNOSIS — I5022 Chronic systolic (congestive) heart failure: Secondary | ICD-10-CM | POA: Diagnosis not present

## 2018-03-28 DIAGNOSIS — I959 Hypotension, unspecified: Secondary | ICD-10-CM | POA: Insufficient documentation

## 2018-03-28 MED ORDER — PERFLUTREN LIPID MICROSPHERE
1.0000 mL | INTRAVENOUS | Status: AC | PRN
  Administered 2018-03-28: 1 mL via INTRAVENOUS

## 2018-05-13 ENCOUNTER — Other Ambulatory Visit (HOSPITAL_COMMUNITY): Payer: Self-pay | Admitting: Internal Medicine

## 2018-05-15 ENCOUNTER — Other Ambulatory Visit (HOSPITAL_COMMUNITY): Payer: Self-pay | Admitting: Internal Medicine

## 2018-05-30 ENCOUNTER — Other Ambulatory Visit: Payer: Self-pay | Admitting: Internal Medicine

## 2018-06-06 ENCOUNTER — Encounter (HOSPITAL_COMMUNITY): Payer: Self-pay | Admitting: Cardiology

## 2018-06-06 ENCOUNTER — Ambulatory Visit (HOSPITAL_COMMUNITY)
Admission: RE | Admit: 2018-06-06 | Discharge: 2018-06-06 | Disposition: A | Discharge status: Home / Self Care | Payer: BLUE CROSS/BLUE SHIELD | Source: Ambulatory Visit | Attending: Cardiology | Admitting: Cardiology

## 2018-06-06 VITALS — BP 98/64 | HR 56 | Wt 148.1 lb

## 2018-06-06 DIAGNOSIS — Z7982 Long term (current) use of aspirin: Secondary | ICD-10-CM | POA: Insufficient documentation

## 2018-06-06 DIAGNOSIS — I5022 Chronic systolic (congestive) heart failure: Secondary | ICD-10-CM | POA: Diagnosis not present

## 2018-06-06 DIAGNOSIS — Z7983 Long term (current) use of bisphosphonates: Secondary | ICD-10-CM | POA: Diagnosis not present

## 2018-06-06 DIAGNOSIS — Z9861 Coronary angioplasty status: Secondary | ICD-10-CM

## 2018-06-06 DIAGNOSIS — Z7902 Long term (current) use of antithrombotics/antiplatelets: Secondary | ICD-10-CM | POA: Insufficient documentation

## 2018-06-06 DIAGNOSIS — Z955 Presence of coronary angioplasty implant and graft: Secondary | ICD-10-CM | POA: Insufficient documentation

## 2018-06-06 DIAGNOSIS — I252 Old myocardial infarction: Secondary | ICD-10-CM | POA: Diagnosis not present

## 2018-06-06 DIAGNOSIS — E785 Hyperlipidemia, unspecified: Secondary | ICD-10-CM | POA: Diagnosis not present

## 2018-06-06 DIAGNOSIS — Z79899 Other long term (current) drug therapy: Secondary | ICD-10-CM | POA: Insufficient documentation

## 2018-06-06 DIAGNOSIS — I2510 Atherosclerotic heart disease of native coronary artery without angina pectoris: Secondary | ICD-10-CM | POA: Insufficient documentation

## 2018-06-06 DIAGNOSIS — Z09 Encounter for follow-up examination after completed treatment for conditions other than malignant neoplasm: Secondary | ICD-10-CM | POA: Insufficient documentation

## 2018-06-06 DIAGNOSIS — Z79891 Long term (current) use of opiate analgesic: Secondary | ICD-10-CM | POA: Diagnosis not present

## 2018-06-06 NOTE — Progress Notes (Signed)
PCP: Dr. Coletta Memos Cardiology: Dr. [name] HF Cardiology: Dr. Aundra Dubin  62 y.o. with history of CAD s/p anterior MI in 11/16 and ischemic cardiomyopathy presents for CHF clinic evaluation.  Patient was in good health until 11/16.  At that time, he had anterior STEMI treated with DES to proximal LAD.  Echo at the time showed EF 30-35%.  Since then, he has had 2 further echoes, both with EF 35-40%, most recently in 9/17.    He has severe OA R knee and is now s/p R TKR with Dr. Ronnie Derby with no complication.   He noted atypical chest pain in 5/18.  Cardiolite showed infarction without significant ischemia.  Cardiac MRI in 5/18 showed EF 44% with peri-apical scar. Echo in 5/19 showed EF 35-40% with wall motion abnormalities.   He presents today for followup of CAD and CHF.  Weight is stable.  BP continues to run low, generally SBP in 90s-100s.  No lightheadedness or falls.  No chest pain. He still fatigues easily.  Can climb 2 flights of stairs without problems, no dyspnea.    Labs (2/17): LDL 46 Labs (11/17): K 3.9, creatinine 0.92, LFTs normal Labs (1/18): K 4.2, creatinine 0.92 Labs (5/18): K 4.5, creatinine 0.94, LDL 38, HDL 43 Labs (8/18): K 4.1, creatinine 0.91 Labs (7/19): LDL 42, K 4.1, creatinine 0.97  PMH: 1. Hyperlipidemia 2. OA: right knee. Right TKR 11/17.  3. CAD: Anterior STEMI 11/16.  Totally occluded LAD on cath => DES to proximal LAD.   - Cardiolite (5/18): EF 35%, large LAD territory infarction with no ischemia.  4. Chronic systolic CHF: Ischemic cardiomyopathy. - 11/16 echo: EF 30-35%  - 2/17 echo: EF 35-40% with regional WMAs.  - 9/17 echo: EF 35-40%, anteroseptal/apical akinesis. - CPX (2/18): peak VO2 19.6, VE/VCO2 slope 30, RER 1.34.  Mild to moderate HF limitation.  - Cardiac MRI (5/18): EF 44%, mid anteroseptal, mid anterior, and peri-apical scar/akinesis, LGE pattern not suggestive of viability.  Mild RV dilation with normal systolic function.  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   FH: No premature CAD  ROS: All systems reviewed and negative except as per HPI.   Current Outpatient Medications  Medication Sig Dispense Refill  . acetaminophen (TYLENOL) 500 MG tablet Take 1,000 mg by mouth every 6 (six) hours as needed for mild pain.    Marland Kitchen aspirin 81 MG chewable tablet Chew 1 tablet (81 mg total) by mouth daily.    Marland Kitchen atorvastatin (LIPITOR) 40 MG tablet TAKE 1 TABLET (40 MG TOTAL) BY MOUTH DAILY AT 6 PM. PLEASE SCHEDULE APPOINTMENT FOR FUTURE REFILLS 90 tablet 0  . carvedilol (COREG) 3.125 MG tablet TAKE 1 TABLET BY MOUTH TWICE A DAY 60 tablet 5  . clopidogrel (PLAVIX) 75 MG tablet TAKE 1 TABLET BY MOUTH EVERY DAY 30 tablet 11  . Coenzyme Q10 (COQ10 PO) Take 1 tablet by mouth daily.    Marland Kitchen gabapentin (NEURONTIN) 300 MG capsule TAKE 1 CAPSULE AT MIDDAY AND 3 CAPSULES AT BEDTIME 120 capsule 0  . ibuprofen (ADVIL,MOTRIN) 200 MG tablet Take 400 mg by mouth every 6 (six) hours as needed for moderate pain.    Marland Kitchen lisinopril (PRINIVIL,ZESTRIL) 2.5 MG tablet TAKE 1 TABLET BY MOUTH AT BEDTIME. 30 tablet 6  . Multiple Vitamin (MULTIVITAMIN) tablet Take 1 tablet by mouth daily.    Marland Kitchen NITROSTAT 0.4 MG SL tablet  PLACE 1 TABLET (0.4 MG TOTAL) UNDER THE TONGUE EVERY 5 (FIVE) MINUTES X 3 DOSES AS NEEDED FOR CHEST 25 tablet 2  . oxymetazoline (MUCINEX NASAL SPRAY FULL FORCE) 0.05 % nasal spray Place 2 sprays into both nostrils as needed for congestion.    Marland Kitchen zolpidem (AMBIEN) 10 MG tablet Take 10 mg by mouth at bedtime as needed for sleep.    Marland Kitchen doxycycline (VIBRAMYCIN) 50 MG capsule Take 50 mg by mouth 2 (two) times daily as needed (for sinus pressure).   3  . methocarbamol (ROBAXIN) 500 MG tablet Take 1 tablet (500 mg total) by mouth every 6 (six) hours as needed for muscle spasms. (Patient not taking: Reported on 02/04/2018) 60 tablet 0  . ranitidine (ZANTAC  75) 75 MG tablet Take 1 tablet (75 mg total) by mouth as needed for heartburn. (Patient not taking: Reported on 02/04/2018)    . sodium chloride (MURO 128) 5 % ophthalmic ointment Place 1 application into both eyes at bedtime.    Marland Kitchen spironolactone (ALDACTONE) 25 MG tablet TAKE 1 TABLET BY MOUTH EVERY DAY IN THE MORNING 90 tablet 1  . traMADol (ULTRAM) 50 MG tablet Take 2 tablets (100 mg total) by mouth every 6 (six) hours as needed. 1 to 2 tablets every 6 hours as needed for pain (Patient not taking: Reported on 06/06/2018) 20 tablet 0   No current facility-administered medications for this encounter.    BP 98/64   Pulse (!) 56   Wt 148 lb 1.9 oz (67.2 kg)   SpO2 98%   BMI 21.87 kg/m    Wt Readings from Last 3 Encounters:  06/06/18 148 lb 1.9 oz (67.2 kg)  12/22/17 145 lb (65.8 kg)  05/21/17 143 lb 8 oz (65.1 kg)   General: NAD Neck: No JVD, no thyromegaly or thyroid nodule.  Lungs: Clear to auscultation bilaterally with normal respiratory effort. CV: Nondisplaced PMI.  Heart regular S1/S2, no S3/S4, no murmur.  No peripheral edema.  No carotid bruit.  Normal pedal pulses.  Abdomen: Soft, nontender, no hepatosplenomegaly, no distention.  Skin: Intact without lesions or rashes.  Neurologic: Alert and oriented x 3.  Psych: Normal affect. Extremities: No clubbing or cyanosis.  HEENT: Normal.   Assessment/Plan: 1. CAD: s/p anterior MI 11/16 with DES to proximal LAD.  Cardiolite in 5/18 without significant ischemia. No exertional chest pain.  - He will continue ASA 81 and atorvastatin 40 daily.  - Now on Plavix 75 mg daily which I would have him continue long-term as long as he does not have bleeding issues.  2. Chronic systolic CHF:  Echo 3/84 with EF 35-40%.  He has been in this range since his MI.  Ischemic cardiomyopathy.  Prior CPX with mild to moderate limitation due to HF.  Cardiac MRI with peri-apical scar and EF 44%. Echo in 5/19 with EF 35-40%.  Volume status stable on exam.  NYHA  class II symptoms.  BP remains soft.  - Increase lisinopril to 2.5 mg bid.  BMET 10 days.  - Continue spironolactone 25 mg daily.     - Continue Coreg 3.125 mg bid.     - EF out of range for ICD.   3. Hyperlipidemia: Good lipids in 7/19.    Followup in 6 months .   Loralie Champagne 06/06/2018

## 2018-06-06 NOTE — Patient Instructions (Signed)
Increase Lisinopril 2.5 mg (1 tab), twice a day  Your physician recommends that you return for lab work in: 2 weeks Rx given  Your physician recommends that you schedule a follow-up appointment in: 6 months with Dr. Aundra Dubin  Please Call an Schedule Appointment Call in November

## 2018-06-27 ENCOUNTER — Other Ambulatory Visit (HOSPITAL_COMMUNITY): Payer: Self-pay | Admitting: Cardiology

## 2018-12-07 ENCOUNTER — Encounter (HOSPITAL_COMMUNITY): Payer: Self-pay | Admitting: Cardiology

## 2018-12-07 ENCOUNTER — Ambulatory Visit (HOSPITAL_COMMUNITY)
Admission: RE | Admit: 2018-12-07 | Discharge: 2018-12-07 | Disposition: A | Discharge status: Home / Self Care | Payer: BLUE CROSS/BLUE SHIELD | Source: Ambulatory Visit | Attending: Cardiology | Admitting: Cardiology

## 2018-12-07 VITALS — BP 100/68 | HR 65 | Wt 148.6 lb

## 2018-12-07 DIAGNOSIS — Z9861 Coronary angioplasty status: Secondary | ICD-10-CM

## 2018-12-07 DIAGNOSIS — I5022 Chronic systolic (congestive) heart failure: Secondary | ICD-10-CM | POA: Diagnosis present

## 2018-12-07 DIAGNOSIS — I2510 Atherosclerotic heart disease of native coronary artery without angina pectoris: Secondary | ICD-10-CM | POA: Diagnosis not present

## 2018-12-07 DIAGNOSIS — R9431 Abnormal electrocardiogram [ECG] [EKG]: Secondary | ICD-10-CM | POA: Diagnosis not present

## 2018-12-07 LAB — BASIC METABOLIC PANEL
ANION GAP: 8 (ref 5–15)
BUN: 14 mg/dL (ref 8–23)
CALCIUM: 9.3 mg/dL (ref 8.9–10.3)
CHLORIDE: 102 mmol/L (ref 98–111)
CO2: 27 mmol/L (ref 22–32)
Creatinine, Ser: 0.91 mg/dL (ref 0.61–1.24)
GFR calc non Af Amer: >60 mL/min (ref >60)
Glucose, Bld: 88 mg/dL (ref 70–99)
Potassium: 4.2 mmol/L (ref 3.5–5.1)
SODIUM: 137 mmol/L (ref 135–145)

## 2018-12-07 LAB — LIPID PANEL
Cholesterol: 100 mg/dL (ref 0–200)
HDL: 42 mg/dL (ref >40)
LDL Cholesterol: 41 mg/dL (ref 0–99)
Total CHOL/HDL Ratio: 2.4 RATIO
Triglycerides: 87 mg/dL (ref <150)
VLDL: 17 mg/dL (ref 0–40)

## 2018-12-07 LAB — CBC
HCT: 42.3 % (ref 39.0–52.0)
Hemoglobin: 14.8 g/dL (ref 13.0–17.0)
MCH: 32.2 pg (ref 26.0–34.0)
MCHC: 35 g/dL (ref 30.0–36.0)
MCV: 92.2 fL (ref 80.0–100.0)
Platelets: 334 10*3/uL (ref 150–400)
RBC: 4.59 MIL/uL (ref 4.22–5.81)
RDW: 11.4 % — ABNORMAL LOW (ref 11.5–15.5)
WBC: 8.4 10*3/uL (ref 4.0–10.5)
nRBC: 0 % (ref 0.0–0.2)

## 2018-12-07 MED ORDER — LISINOPRIL 2.5 MG PO TABS: 2.5000 mg | ORAL_TABLET | Freq: Two times a day (BID) | ORAL | 6 refills | Status: DC

## 2018-12-07 MED ORDER — SPIRONOLACTONE 25 MG PO TABS: 25.0000 mg | ORAL_TABLET | Freq: Every day | ORAL | 1 refills | Status: DC

## 2018-12-07 NOTE — Patient Instructions (Addendum)
INCREASE Lisinopril to 2.5mg  TWICE A DAY  DECREASE Spironoloctone to 25mg  DAILY  Labs were drawn today. We will call you to discuss any ABNORMAL labs. No news is good news.   Follow-up with lab work in 10 days.  The clip was used today to measure the fluid/lung ratio in your chest.   Your physician recommends that you schedule a follow-up appointment in May, 2020 with an ECHO.  Your physician has requested that you have an echocardiogram. Echocardiography is a painless test that uses sound waves to create images of your heart. It provides your doctor with information about the size and shape of your heart and how well your heart's chambers and valves are working. This procedure takes approximately one hour. There are no restrictions for this procedure.  A Cardiopulmonary exercise test has been scheduled.

## 2018-12-07 NOTE — Progress Notes (Signed)
ReDS Vest - 12/07/18 1400      ReDS Vest   Estimated volume prior to reading  Low    Fitting Posture  Sitting    Height Marker  Tall    Ruler Value  31    Center Strip  Aligned    ReDS Value  23

## 2018-12-08 NOTE — Progress Notes (Signed)
PCP: Dr. Coletta Memos Cardiology: Dr. [name] HF Cardiology: Dr. Aundra Dubin  63 y.o. with history of CAD s/p anterior MI in 11/16 and ischemic cardiomyopathy presents for CHF clinic evaluation.  Patient was in good health until 11/16.  At that time, he had anterior STEMI treated with DES to proximal LAD.  Echo at the time showed EF 30-35%.  Since then, he has had 2 further echoes, both with EF 35-40%, most recently in 9/17.    He has severe OA R knee and is now s/p R TKR with Dr. Ronnie Derby with no complication.   He noted atypical chest pain in 5/18.  Cardiolite showed infarction without significant ischemia.  Cardiac MRI in 5/18 showed EF 44% with peri-apical scar. Echo in 5/19 showed EF 35-40% with wall motion abnormalities.   He presents today for followup of CAD and CHF.  Weight is up 3 lbs.  He is short of breath after walking up 3 flights of stairs or doing heavy yardwork. He feels like his stamina is poor. Generally, symptoms seem stable.  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    REDS clip 23%  ECG: NSR, old anterior MI  Labs (2/17): LDL 46 Labs (11/17): K 3.9, creatinine 0.92, LFTs normal Labs (1/18): K 4.2, creatinine 0.92 Labs (5/18): K 4.5, creatinine 0.94, LDL 38, HDL 43 Labs (8/18): K 4.1, creatinine 0.91 Labs (7/19): LDL 42, K 4.1, creatinine 0.97  PMH: 1. Hyperlipidemia 2. OA: right knee. Right TKR 11/17.  3. CAD: Anterior STEMI 11/16.  Totally occluded LAD on cath => DES to proximal LAD.   - Cardiolite (5/18): EF 35%, large LAD territory infarction with no ischemia.  4. Chronic systolic CHF: Ischemic cardiomyopathy. - 11/16 echo: EF 30-35%  - 2/17 echo: EF 35-40% with regional WMAs.  - 9/17 echo: EF 35-40%, anteroseptal/apical akinesis. - CPX (2/18): peak VO2 19.6, VE/VCO2 slope 30, RER 1.34.  Mild to moderate HF limitation.  - Cardiac MRI (5/18): EF 44%, mid anteroseptal, mid anterior, and peri-apical scar/akinesis, LGE pattern not  suggestive of viability.  Mild RV dilation with normal systolic function.  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   FH: No premature CAD  ROS: All systems reviewed and negative except as per HPI.   Current Outpatient Medications  Medication Sig Dispense Refill  . acetaminophen (TYLENOL) 500 MG tablet Take 1,000 mg by mouth every 6 (six) hours as needed for mild pain.    Marland Kitchen aspirin 81 MG chewable tablet Chew 1 tablet (81 mg total) by mouth daily.    Marland Kitchen atorvastatin (LIPITOR) 40 MG tablet TAKE 1 TABLET (40 MG TOTAL) BY MOUTH DAILY AT 6 PM. PLEASE SCHEDULE APPOINTMENT FOR FUTURE REFILLS 90 tablet 0  . carvedilol (COREG) 3.125 MG tablet TAKE 1 TABLET BY MOUTH TWICE A DAY 60 tablet 5  . clopidogrel (PLAVIX) 75 MG tablet TAKE 1 TABLET BY MOUTH EVERY DAY 30 tablet 11  . Coenzyme Q10 (COQ10 PO) Take 1 tablet by mouth daily.    Marland Kitchen doxycycline (VIBRAMYCIN) 50 MG capsule Take 50 mg by mouth 2 (two) times daily as needed (for sinus pressure).   3  . gabapentin (NEURONTIN) 300 MG capsule TAKE 1 CAPSULE AT MIDDAY AND 3 CAPSULES AT BEDTIME 120 capsule 0  . lisinopril (PRINIVIL,ZESTRIL) 2.5 MG tablet Take 1 tablet (2.5 mg  total) by mouth 2 (two) times daily. 30 tablet 6  . Multiple Vitamin (MULTIVITAMIN) tablet Take 1 tablet by mouth daily.    Marland Kitchen NITROSTAT 0.4 MG SL tablet PLACE 1 TABLET (0.4 MG TOTAL) UNDER THE TONGUE EVERY 5 (FIVE) MINUTES X 3 DOSES AS NEEDED FOR CHEST 25 tablet 2  . oxymetazoline (MUCINEX NASAL SPRAY FULL FORCE) 0.05 % nasal spray Place 2 sprays into both nostrils as needed for congestion.    . sodium chloride (MURO 128) 5 % ophthalmic ointment Place 1 application into both eyes at bedtime.    Marland Kitchen spironolactone (ALDACTONE) 25 MG tablet Take 1 tablet (25 mg total) by mouth daily. 90 tablet 1  . zolpidem (AMBIEN) 10 MG tablet Take 10 mg by mouth at bedtime as needed for  sleep.    Marland Kitchen ibuprofen (ADVIL,MOTRIN) 200 MG tablet Take 400 mg by mouth every 6 (six) hours as needed for moderate pain.    . methocarbamol (ROBAXIN) 500 MG tablet Take 1 tablet (500 mg total) by mouth every 6 (six) hours as needed for muscle spasms. (Patient not taking: Reported on 12/07/2018) 60 tablet 0  . ranitidine (ZANTAC 75) 75 MG tablet Take 1 tablet (75 mg total) by mouth as needed for heartburn. (Patient not taking: Reported on 02/04/2018)    . traMADol (ULTRAM) 50 MG tablet Take 2 tablets (100 mg total) by mouth every 6 (six) hours as needed. 1 to 2 tablets every 6 hours as needed for pain (Patient not taking: Reported on 06/06/2018) 20 tablet 0   No current facility-administered medications for this encounter.    BP 100/68   Pulse 65   Wt 67.4 kg (148 lb 9.6 oz)   SpO2 98%   BMI 21.94 kg/m    Wt Readings from Last 3 Encounters:  12/07/18 67.4 kg (148 lb 9.6 oz)  06/06/18 67.2 kg (148 lb 1.9 oz)  12/22/17 65.8 kg (145 lb)   General: NAD Neck: No JVD, no thyromegaly or thyroid nodule.  Lungs: Clear to auscultation bilaterally with normal respiratory effort. CV: Nondisplaced PMI.  Heart regular S1/S2, no S3/S4, no murmur.  No peripheral edema.  No carotid bruit.  Normal pedal pulses.  Abdomen: Soft, nontender, no hepatosplenomegaly, no distention.  Skin: Intact without lesions or rashes.  Neurologic: Alert and oriented x 3.  Psych: Normal affect. Extremities: No clubbing or cyanosis.  HEENT: Normal.   Assessment/Plan: 1. CAD: s/p anterior MI 11/16 with DES to proximal LAD.  Cardiolite in 5/18 without significant ischemia. No exertional chest pain.  - He will continue ASA 81 and atorvastatin 40 daily.  - Now on Plavix 75 mg daily which I would have him continue long-term as long as he does not have bleeding issues.  2. Chronic systolic CHF:  Echo 0/08 with EF 35-40%.  He has been in this range since his MI.  Ischemic cardiomyopathy.  Prior CPX with mild to moderate limitation  due to HF. Cardiac MRI with peri-apical scar and EF 44%. Echo in 5/19 with EF 35-40%.  Volume status stable on exam and by REDS clip.  NYHA class II symptoms but feels like stamina is very poor.  BP historically soft, higher than usual today.   - Increase lisinopril to 2.5 mg bid.  BMET today and in 10 days.  - He only needs to take spironolactone 25 mg daily, can decrease from bid.     - Continue Coreg 3.125 mg bid.     - EF out of range for  ICD.   - Repeat echo in 5/19.  - With ongoing complaints of dyspnea and poor stamina, I will repeat CPX.  I think that depression may play a role in his symptoms as well.  3. Hyperlipidemia: Check lipids today.    Followup in 5/19 with echo.   Loralie Champagne 12/08/2018

## 2018-12-19 ENCOUNTER — Other Ambulatory Visit (HOSPITAL_COMMUNITY): Payer: BLUE CROSS/BLUE SHIELD

## 2018-12-20 ENCOUNTER — Ambulatory Visit (HOSPITAL_COMMUNITY): Payer: BLUE CROSS/BLUE SHIELD

## 2018-12-20 ENCOUNTER — Ambulatory Visit (HOSPITAL_COMMUNITY)
Admission: RE | Admit: 2018-12-20 | Discharge: 2018-12-20 | Disposition: A | Discharge status: Home / Self Care | Payer: BLUE CROSS/BLUE SHIELD | Source: Ambulatory Visit | Attending: Internal Medicine | Admitting: Internal Medicine

## 2018-12-20 DIAGNOSIS — I5022 Chronic systolic (congestive) heart failure: Secondary | ICD-10-CM | POA: Insufficient documentation

## 2018-12-20 LAB — BASIC METABOLIC PANEL
Anion gap: 9 (ref 5–15)
BUN: 14 mg/dL (ref 8–23)
CO2: 25 mmol/L (ref 22–32)
CREATININE: 1.16 mg/dL (ref 0.61–1.24)
Calcium: 9.1 mg/dL (ref 8.9–10.3)
Chloride: 103 mmol/L (ref 98–111)
GFR calc Af Amer: >60 mL/min (ref >60)
GFR calc non Af Amer: >60 mL/min (ref >60)
Glucose, Bld: 184 mg/dL — ABNORMAL HIGH (ref 70–99)
Potassium: 4.1 mmol/L (ref 3.5–5.1)
Sodium: 137 mmol/L (ref 135–145)

## 2018-12-21 ENCOUNTER — Other Ambulatory Visit (HOSPITAL_COMMUNITY): Payer: Self-pay | Admitting: *Deleted

## 2018-12-21 DIAGNOSIS — I5022 Chronic systolic (congestive) heart failure: Secondary | ICD-10-CM

## 2018-12-22 ENCOUNTER — Encounter (HOSPITAL_COMMUNITY): Payer: BLUE CROSS/BLUE SHIELD

## 2018-12-22 ENCOUNTER — Telehealth (HOSPITAL_COMMUNITY): Payer: Self-pay

## 2018-12-22 NOTE — Telephone Encounter (Signed)
-----   Message from Larey Dresser, MD sent at 12/21/2018  1:41 PM EST ----- On a very mild HF limitation. Overall pretty good and no change from prior.

## 2018-12-22 NOTE — Telephone Encounter (Signed)
Pt aware of results and appreciative.  

## 2019-03-18 ENCOUNTER — Other Ambulatory Visit (HOSPITAL_COMMUNITY): Payer: Self-pay | Admitting: Cardiology

## 2019-04-04 ENCOUNTER — Encounter (HOSPITAL_COMMUNITY): Payer: BLUE CROSS/BLUE SHIELD | Admitting: Cardiology

## 2019-04-04 ENCOUNTER — Ambulatory Visit (HOSPITAL_COMMUNITY): Payer: BLUE CROSS/BLUE SHIELD

## 2019-06-07 ENCOUNTER — Other Ambulatory Visit: Payer: Self-pay

## 2019-06-07 ENCOUNTER — Ambulatory Visit (HOSPITAL_COMMUNITY)
Admission: RE | Admit: 2019-06-07 | Discharge: 2019-06-07 | Disposition: A | Discharge status: Home / Self Care | Payer: BC Managed Care – PPO | Source: Ambulatory Visit | Attending: Nurse Practitioner | Admitting: Nurse Practitioner

## 2019-06-07 ENCOUNTER — Encounter (HOSPITAL_COMMUNITY): Payer: Self-pay | Admitting: Cardiology

## 2019-06-07 ENCOUNTER — Ambulatory Visit (HOSPITAL_BASED_OUTPATIENT_CLINIC_OR_DEPARTMENT_OTHER)
Admission: RE | Admit: 2019-06-07 | Discharge: 2019-06-07 | Disposition: A | Discharge status: Home / Self Care | Payer: BC Managed Care – PPO | Source: Ambulatory Visit | Attending: Cardiology | Admitting: Cardiology

## 2019-06-07 VITALS — BP 103/68 | HR 56 | Wt 150.2 lb

## 2019-06-07 DIAGNOSIS — Z87891 Personal history of nicotine dependence: Secondary | ICD-10-CM | POA: Insufficient documentation

## 2019-06-07 DIAGNOSIS — I252 Old myocardial infarction: Secondary | ICD-10-CM | POA: Diagnosis not present

## 2019-06-07 DIAGNOSIS — Z7902 Long term (current) use of antithrombotics/antiplatelets: Secondary | ICD-10-CM | POA: Diagnosis not present

## 2019-06-07 DIAGNOSIS — Z955 Presence of coronary angioplasty implant and graft: Secondary | ICD-10-CM | POA: Insufficient documentation

## 2019-06-07 DIAGNOSIS — I2510 Atherosclerotic heart disease of native coronary artery without angina pectoris: Secondary | ICD-10-CM | POA: Diagnosis not present

## 2019-06-07 DIAGNOSIS — I255 Ischemic cardiomyopathy: Secondary | ICD-10-CM | POA: Diagnosis not present

## 2019-06-07 DIAGNOSIS — I5022 Chronic systolic (congestive) heart failure: Secondary | ICD-10-CM

## 2019-06-07 DIAGNOSIS — Z79899 Other long term (current) drug therapy: Secondary | ICD-10-CM | POA: Diagnosis not present

## 2019-06-07 DIAGNOSIS — Z7982 Long term (current) use of aspirin: Secondary | ICD-10-CM | POA: Insufficient documentation

## 2019-06-07 DIAGNOSIS — E785 Hyperlipidemia, unspecified: Secondary | ICD-10-CM | POA: Insufficient documentation

## 2019-06-07 NOTE — Patient Instructions (Signed)
EKG was completed.   Your physician wants you to follow-up in: 6 Months. You will receive a reminder letter in the mail two months in advance. If you don't receive a letter, please call our office to schedule the follow-up appointment.  At the [facility], you and your health needs are our priority. As part of our continuing mission to provide you with exceptional heart care, we have created designated Provider Care Teams. These Care Teams include your primary Cardiologist (physician) and Advanced Practice Providers (APPs- Physician Assistants and Nurse Practitioners) who all work together to provide you with the care you need, when you need it.   You may see any of the following providers on your designated Care Team at your next follow up: Marland Kitchen Dr Glori Bickers . Dr Loralie Champagne . Darrick Grinder, NP

## 2019-06-07 NOTE — Progress Notes (Signed)
  Echocardiogram 2D Echocardiogram has been performed.  Dav[place] Fanara L Androw 06/07/2019, 1:52 PM

## 2019-06-08 NOTE — Progress Notes (Signed)
PCP: Dr. Coletta Memos Cardiology: Dr. [name] HF Cardiology: Dr. Aundra Dubin  63 y.o. with history of CAD s/p anterior MI in 11/16 and ischemic cardiomyopathy presents for CHF clinic evaluation.  Patient was in good health until 11/16.  At that time, he had anterior STEMI treated with DES to proximal LAD.  Echo at the time showed EF 30-35%.  Since then, he has had 2 further echoes, both with EF 35-40%, most recently in 9/17.    He has severe OA R knee and is now s/p R TKR with Dr. Ronnie Derby with no complication.   He noted atypical chest pain in 5/18.  Cardiolite showed infarction without significant ischemia.  Cardiac MRI in 5/18 showed EF 44% with peri-apical scar. Echo in 5/19 showed EF 35-40% with wall motion abnormalities. CPX in 1/19 showed very mild HF limitation.    Echo was done today and reviewed, EF 40-45% with peri-apical akinesis.   He presents today for followup of CAD and CHF.  Weight is stable.  No significant dyspnea with exertion.  He is tired after walking up 3 flights of stairs in a row.  No lightheadedness since he cut back lisinopril to 2.5 mg once daily (had orthostatic symptoms with lisinopril 2.5 mg bid).  Rare atypical chest pain.   ECG: NSR, low voltage, LAFB, old ASMI  Labs (2/17): LDL 46 Labs (11/17): K 3.9, creatinine 0.92, LFTs normal Labs (1/18): K 4.2, creatinine 0.92 Labs (5/18): K 4.5, creatinine 0.94, LDL 38, HDL 43 Labs (8/18): K 4.1, creatinine 0.91 Labs (7/19): LDL 42, K 4.1, creatinine 0.97 Labs (7/20): K 4, creatinine 0.9, LDL 42, hgb 14.1  PMH: 1. Hyperlipidemia 2. OA: right knee. Right TKR 11/17.  3. CAD: Anterior STEMI 11/16.  Totally occluded LAD on cath => DES to proximal LAD.   - Cardiolite (5/18): EF 35%, large LAD territory infarction with no ischemia.  4. Chronic systolic CHF: Ischemic cardiomyopathy. - 11/16 echo: EF 30-35%  - 2/17 echo: EF 35-40% with regional WMAs.  - 9/17 echo: EF 35-40%, anteroseptal/apical akinesis. - CPX (2/18): peak VO2 19.6,  VE/VCO2 slope 30, RER 1.34.  Mild to moderate HF limitation.  - Cardiac MRI (5/18): EF 44%, mid anteroseptal, mid anterior, and peri-apical scar/akinesis, LGE pattern not suggestive of viability.  Mild RV dilation with normal systolic function.  - Echo (5/19): EF 35-40%, septal apical and anterior hypokinesis - CPX (1/20): Very mild HF limitation.  VO2 max 20.6, VE/VCO2 slope 31, RER 1.35, PFTs normal.  - Echo (7/20): EF 40-45%, peri-apical akinesis, normal RV.   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   FH: No premature CAD  ROS: All systems reviewed and negative except as per HPI.   Current Outpatient Medications  Medication Sig Dispense Refill  . acetaminophen (TYLENOL) 500 MG tablet Take 1,000 mg by mouth every 6 (six) hours as needed for mild pain.    Marland Kitchen aspirin 81 MG chewable tablet Chew 1 tablet (81 mg total) by mouth daily.    Marland Kitchen atorvastatin (LIPITOR) 40 MG tablet TAKE 1 TABLET (40 MG TOTAL) BY MOUTH DAILY AT 6 PM. PLEASE SCHEDULE APPOINTMENT FOR FUTURE REFILLS 90 tablet 0  . carvedilol (COREG) 3.125 MG tablet TAKE 1 TABLET BY MOUTH TWICE A DAY 60 tablet 5  . clopidogrel (PLAVIX) 75 MG tablet TAKE 1 TABLET BY MOUTH EVERY DAY 30 tablet 11  . Coenzyme Q10 (COQ10 PO) Take 1 tablet by mouth daily.    Marland Kitchen  doxycycline (VIBRAMYCIN) 50 MG capsule Take 50 mg by mouth 2 (two) times daily as needed (for sinus pressure).   3  . gabapentin (NEURONTIN) 300 MG capsule TAKE 1 CAPSULE AT MIDDAY AND 3 CAPSULES AT BEDTIME 120 capsule 0  . lisinopril (ZESTRIL) 2.5 MG tablet TAKE 1 TABLET BY MOUTH TWICE A DAY 180 tablet 1  . Multiple Vitamin (MULTIVITAMIN) tablet Take 1 tablet by mouth daily.    Marland Kitchen NITROSTAT 0.4 MG SL tablet PLACE 1 TABLET (0.4 MG TOTAL) UNDER THE TONGUE EVERY 5 (FIVE) MINUTES X 3 DOSES AS NEEDED FOR CHEST 25 tablet 2  . oxymetazoline (MUCINEX NASAL SPRAY FULL FORCE) 0.05 % nasal spray Place 2 sprays into both nostrils as needed for  congestion.    . sodium chloride (MURO 128) 5 % ophthalmic ointment Place 1 application into both eyes at bedtime.    Marland Kitchen spironolactone (ALDACTONE) 25 MG tablet Take 1 tablet (25 mg total) by mouth daily. 90 tablet 1  . zolpidem (AMBIEN) 10 MG tablet Take 10 mg by mouth at bedtime as needed for sleep.    Marland Kitchen ibuprofen (ADVIL,MOTRIN) 200 MG tablet Take 400 mg by mouth every 6 (six) hours as needed for moderate pain.    . methocarbamol (ROBAXIN) 500 MG tablet Take 1 tablet (500 mg total) by mouth every 6 (six) hours as needed for muscle spasms. (Patient not taking: Reported on 12/07/2018) 60 tablet 0  . ranitidine (ZANTAC 75) 75 MG tablet Take 1 tablet (75 mg total) by mouth as needed for heartburn. (Patient not taking: Reported on 02/04/2018)    . traMADol (ULTRAM) 50 MG tablet Take 2 tablets (100 mg total) by mouth every 6 (six) hours as needed. 1 to 2 tablets every 6 hours as needed for pain (Patient not taking: Reported on 06/06/2018) 20 tablet 0   No current facility-administered medications for this encounter.    BP 103/68   Pulse (!) 56   Wt 68.1 kg (150 lb 3.2 oz)   SpO2 100%   BMI 22.18 kg/m    Wt Readings from Last 3 Encounters:  06/07/19 68.1 kg (150 lb 3.2 oz)  12/07/18 67.4 kg (148 lb 9.6 oz)  06/06/18 67.2 kg (148 lb 1.9 oz)   General: NAD Neck: No JVD, no thyromegaly or thyroid nodule.  Lungs: Clear to auscultation bilaterally with normal respiratory effort. CV: Nondisplaced PMI.  Heart regular S1/S2, no S3/S4, no murmur.  No peripheral edema.  No carotid bruit.  Normal pedal pulses.  Abdomen: Soft, nontender, no hepatosplenomegaly, no distention.  Skin: Intact without lesions or rashes.  Neurologic: Alert and oriented x 3.  Psych: Normal affect. Extremities: No clubbing or cyanosis.  HEENT: Normal.   Assessment/Plan: 1. CAD: s/p anterior MI 11/16 with DES to proximal LAD.  Cardiolite in 5/18 without significant ischemia. No exertional chest pain.  - He will continue ASA 81  and atorvastatin 40 daily.  - Now on Plavix 75 mg daily which I would have him continue long-term as long as he does not have bleeding issues.  2. Chronic systolic CHF:  Echo 0/96 with EF 35-40%.  He has been in this range since his MI.  Ischemic cardiomyopathy.  Prior CPX with mild to moderate limitation due to HF. Cardiac MRI with peri-apical scar and EF 44%. Echo in 5/19 with EF 35-40%.  CPX showed minimal HF limitation, normal PFTs.  Echo today showed EF 40-45%, inferior/inferolateral akinesis.  NYHA class II symptoms, he is not volume overloaded.  I  do not think that he can tolerate increasing his meds.  - Continue lisinopril 2.5 mg daily.  - Continue spironolactone 25 mg daily.  - Continue Coreg 3.125 mg bid.     - EF out of range for ICD.   - Work to increase exercise.  3. Hyperlipidemia: Good lipids in 7/20.  No changes.     Followup in 3 months.   Loralie Champagne 06/08/2019

## 2019-06-10 ENCOUNTER — Other Ambulatory Visit: Payer: Self-pay | Admitting: Family Medicine

## 2019-06-10 DIAGNOSIS — Z136 Encounter for screening for cardiovascular disorders: Secondary | ICD-10-CM

## 2019-06-21 ENCOUNTER — Other Ambulatory Visit: Payer: Self-pay

## 2019-06-21 ENCOUNTER — Ambulatory Visit
Admission: RE | Admit: 2019-06-21 | Discharge: 2019-06-21 | Disposition: A | Discharge status: Home / Self Care | Payer: BC Managed Care – PPO | Source: Ambulatory Visit | Attending: Family Medicine | Admitting: Family Medicine

## 2019-06-21 DIAGNOSIS — Z136 Encounter for screening for cardiovascular disorders: Secondary | ICD-10-CM

## 2019-09-12 ENCOUNTER — Other Ambulatory Visit (HOSPITAL_COMMUNITY): Payer: Self-pay | Admitting: Cardiology

## 2019-12-15 ENCOUNTER — Other Ambulatory Visit: Payer: Self-pay

## 2019-12-15 ENCOUNTER — Ambulatory Visit (HOSPITAL_COMMUNITY)
Admission: RE | Admit: 2019-12-15 | Discharge: 2019-12-15 | Disposition: A | Discharge status: Home / Self Care | Payer: BC Managed Care – PPO | Source: Ambulatory Visit | Attending: Cardiology | Admitting: Cardiology

## 2019-12-15 ENCOUNTER — Encounter (HOSPITAL_COMMUNITY): Payer: Self-pay | Admitting: Cardiology

## 2019-12-15 VITALS — BP 88/63 | HR 61 | Wt 149.8 lb

## 2019-12-15 DIAGNOSIS — I2510 Atherosclerotic heart disease of native coronary artery without angina pectoris: Secondary | ICD-10-CM | POA: Diagnosis not present

## 2019-12-15 DIAGNOSIS — I255 Ischemic cardiomyopathy: Secondary | ICD-10-CM | POA: Insufficient documentation

## 2019-12-15 DIAGNOSIS — Z7982 Long term (current) use of aspirin: Secondary | ICD-10-CM | POA: Diagnosis not present

## 2019-12-15 DIAGNOSIS — Z79899 Other long term (current) drug therapy: Secondary | ICD-10-CM | POA: Insufficient documentation

## 2019-12-15 DIAGNOSIS — Z9861 Coronary angioplasty status: Secondary | ICD-10-CM | POA: Diagnosis not present

## 2019-12-15 DIAGNOSIS — Z7902 Long term (current) use of antithrombotics/antiplatelets: Secondary | ICD-10-CM | POA: Insufficient documentation

## 2019-12-15 DIAGNOSIS — I252 Old myocardial infarction: Secondary | ICD-10-CM | POA: Insufficient documentation

## 2019-12-15 DIAGNOSIS — M1711 Unilateral primary osteoarthritis, right knee: Secondary | ICD-10-CM | POA: Diagnosis not present

## 2019-12-15 DIAGNOSIS — E785 Hyperlipidemia, unspecified: Secondary | ICD-10-CM | POA: Insufficient documentation

## 2019-12-15 DIAGNOSIS — Z96651 Presence of right artificial knee joint: Secondary | ICD-10-CM | POA: Diagnosis not present

## 2019-12-15 DIAGNOSIS — Z955 Presence of coronary angioplasty implant and graft: Secondary | ICD-10-CM | POA: Diagnosis not present

## 2019-12-15 DIAGNOSIS — I5022 Chronic systolic (congestive) heart failure: Secondary | ICD-10-CM | POA: Diagnosis not present

## 2019-12-15 LAB — BASIC METABOLIC PANEL
Anion gap: 8 (ref 5–15)
BUN: 12 mg/dL (ref 8–23)
CO2: 26 mmol/L (ref 22–32)
Calcium: 9.2 mg/dL (ref 8.9–10.3)
Chloride: 105 mmol/L (ref 98–111)
Creatinine, Ser: 0.87 mg/dL (ref 0.61–1.24)
GFR calc Af Amer: >60 mL/min (ref >60)
GFR calc non Af Amer: >60 mL/min (ref >60)
Glucose, Bld: 100 mg/dL — ABNORMAL HIGH (ref 70–99)
Potassium: 4.7 mmol/L (ref 3.5–5.1)
Sodium: 139 mmol/L (ref 135–145)

## 2019-12-15 LAB — CBC
HCT: 44.3 % (ref 39.0–52.0)
Hemoglobin: 15 g/dL (ref 13.0–17.0)
MCH: 31.3 pg (ref 26.0–34.0)
MCHC: 33.9 g/dL (ref 30.0–36.0)
MCV: 92.5 fL (ref 80.0–100.0)
Platelets: 298 10*3/uL (ref 150–400)
RBC: 4.79 MIL/uL (ref 4.22–5.81)
RDW: 11.3 % — ABNORMAL LOW (ref 11.5–15.5)
WBC: 6.8 10*3/uL (ref 4.0–10.5)
nRBC: 0 % (ref 0.0–0.2)

## 2019-12-15 LAB — LIPID PANEL
Cholesterol: 105 mg/dL (ref 0–200)
HDL: 42 mg/dL (ref >40)
LDL Cholesterol: 48 mg/dL (ref 0–99)
Total CHOL/HDL Ratio: 2.5 RATIO
Triglycerides: 75 mg/dL (ref <150)
VLDL: 15 mg/dL (ref 0–40)

## 2019-12-15 NOTE — Patient Instructions (Addendum)
No medication changes today!  Please call our office if you experience chest pain or any other heart symptoms.  At that time we can arrange for you to have a stress test.   Labs today We will only contact you if something comes back abnormal or we need to make some changes. Otherwise no news is good news!    Your physician recommends that you schedule a follow-up appointment in: 6 months. We will contact you in a few months to schedule your next appointment. Please call our office if you do not get this call.   Please call office at [phone] option [phone] if you have any questions or concerns.    At the [facility], you and your health needs are our priority. As part of our continuing mission to provide you with exceptional heart care, we have created designated Provider Care Teams. These Care Teams include your primary Cardiologist (physician) and Advanced Practice Providers (APPs- Physician Assistants and Nurse Practitioners) who all work together to provide you with the care you need, when you need it.   You may see any of the following providers on your designated Care Team at your next follow up: Marland Kitchen Dr Glori Bickers . Dr Loralie Champagne . Darrick Grinder, NP . Lyda Jester, PA . Audry Riles, PharmD   Please be sure to bring in all your medications bottles to every appointment.

## 2019-12-17 NOTE — Progress Notes (Signed)
PCP: Dr. Coletta Memos Cardiology: Dr. [name] HF Cardiology: Dr. Aundra Dubin  64 y.o. with history of CAD s/p anterior MI in 11/16 and ischemic cardiomyopathy presents for CHF clinic evaluation.  Patient was in good health until 11/16.  At that time, he had anterior STEMI treated with DES to proximal LAD.  Echo at the time showed EF 30-35%.  Since then, he has had 2 further echoes, both with EF 35-40%, most recently in 9/17.    He has severe OA R knee and is now s/p R TKR with Dr. Ronnie Derby with no complication.   He noted atypical chest pain in 5/18.  Cardiolite showed infarction without significant ischemia.  Cardiac MRI in 5/18 showed EF 44% with peri-apical scar. Echo in 5/19 showed EF 35-40% with wall motion abnormalities. CPX in 1/19 showed very mild HF limitation.    Echo in 7/20 showed EF 40-45% with peri-apical akinesis.   He presents today for followup of CAD and CHF.  No lightheadedness since he cut back lisinopril to 2.5 mg once daily (had orthostatic symptoms with lisinopril 2.5 mg bid).  He is short of breath if he climbs up 3 flights of stairs.  No exertional chest pain.  He has been having episodes of left lateral chest pain after eating and sometimes with emotional stress.     ECG: NSR, old anterior MI, LAFB  Labs (2/17): LDL 46 Labs (11/17): K 3.9, creatinine 0.92, LFTs normal Labs (1/18): K 4.2, creatinine 0.92 Labs (5/18): K 4.5, creatinine 0.94, LDL 38, HDL 43 Labs (8/18): K 4.1, creatinine 0.91 Labs (7/19): LDL 42, K 4.1, creatinine 0.97 Labs (7/20): K 4, creatinine 0.9, LDL 42, hgb 14.1   PMH: 1. Hyperlipidemia 2. OA: right knee. Right TKR 11/17.  3. CAD: Anterior STEMI 11/16.  Totally occluded LAD on cath => DES to proximal LAD.   - Cardiolite (5/18): EF 35%, large LAD territory infarction with no ischemia.  4. Chronic systolic CHF: Ischemic cardiomyopathy. - 11/16 echo: EF 30-35%  - 2/17 echo: EF 35-40% with regional WMAs.  - 9/17 echo: EF 35-40%, anteroseptal/apical  akinesis. - CPX (2/18): peak VO2 19.6, VE/VCO2 slope 30, RER 1.34.  Mild to moderate HF limitation.  - Cardiac MRI (5/18): EF 44%, mid anteroseptal, mid anterior, and peri-apical scar/akinesis, LGE pattern not suggestive of viability.  Mild RV dilation with normal systolic function.  - Echo (5/19): EF 35-40%, septal apical and anterior hypokinesis - CPX (1/20): Very mild HF limitation.  VO2 max 20.6, VE/VCO2 slope 31, RER 1.35, PFTs normal.  - Echo (7/20): EF 40-45%, peri-apical akinesis, normal RV.   SH: Lives in [place], works as Optometrist, married, quit smoking > 15 years ago, occasional ETOH.   FH: No premature CAD  ROS: All systems reviewed and negative except as per HPI.   Current Outpatient Medications  Medication Sig Dispense Refill  . acetaminophen (TYLENOL) 500 MG tablet Take 1,000 mg by mouth every 6 (six) hours as needed for mild pain.    Marland Kitchen aspirin 81 MG chewable tablet Chew 1 tablet (81 mg total) by mouth daily.    Marland Kitchen atorvastatin (LIPITOR) 40 MG tablet TAKE 1 TABLET (40 MG TOTAL) BY MOUTH DAILY AT 6 PM. PLEASE SCHEDULE APPOINTMENT FOR FUTURE REFILLS 90 tablet 0  . carvedilol (COREG) 3.125 MG tablet TAKE 1 TABLET BY MOUTH TWICE A DAY 60 tablet 5  . clopidogrel (PLAVIX) 75 MG tablet TAKE 1 TABLET BY MOUTH EVERY DAY 30 tablet 11  . Coenzyme Q10 (COQ10 PO) Take  1 tablet by mouth daily.    Marland Kitchen doxycycline (VIBRAMYCIN) 50 MG capsule Take 50 mg by mouth 2 (two) times daily as needed (for sinus pressure).   3  . gabapentin (NEURONTIN) 300 MG capsule TAKE 1 CAPSULE AT MIDDAY AND 3 CAPSULES AT BEDTIME 120 capsule 0  . ibuprofen (ADVIL,MOTRIN) 200 MG tablet Take 400 mg by mouth every 6 (six) hours as needed for moderate pain.    Marland Kitchen lisinopril (ZESTRIL) 2.5 MG tablet Take 2.5 mg by mouth daily.    . Multiple Vitamin (MULTIVITAMIN) tablet Take 1 tablet by mouth daily.    Marland Kitchen NITROSTAT 0.4 MG SL tablet PLACE 1 TABLET (0.4 MG TOTAL) UNDER THE TONGUE EVERY 5 (FIVE) MINUTES X 3 DOSES AS NEEDED FOR  CHEST 25 tablet 2  . oxymetazoline (MUCINEX NASAL SPRAY FULL FORCE) 0.05 % nasal spray Place 2 sprays into both nostrils as needed for congestion.    . ranitidine (ZANTAC 75) 75 MG tablet Take 1 tablet (75 mg total) by mouth as needed for heartburn.    . sodium chloride (MURO 128) 5 % ophthalmic ointment Place 1 application into both eyes at bedtime.    Marland Kitchen spironolactone (ALDACTONE) 25 MG tablet Take 1 tablet (25 mg total) by mouth daily. 90 tablet 1  . zolpidem (AMBIEN) 10 MG tablet Take 10 mg by mouth at bedtime.      No current facility-administered medications for this encounter.   BP (!) 88/63   Pulse 61   Wt 67.9 kg (149 lb 12.8 oz)   SpO2 100%   BMI 22.12 kg/m    Wt Readings from Last 3 Encounters:  12/15/19 67.9 kg (149 lb 12.8 oz)  06/07/19 68.1 kg (150 lb 3.2 oz)  12/07/18 67.4 kg (148 lb 9.6 oz)   General: NAD Neck: No JVD, no thyromegaly or thyroid nodule.  Lungs: Clear to auscultation bilaterally with normal respiratory effort. CV: Nondisplaced PMI.  Heart regular S1/S2, no S3/S4, no murmur.  No peripheral edema.  No carotid bruit.  Normal pedal pulses.  Abdomen: Soft, nontender, no hepatosplenomegaly, no distention.  Skin: Intact without lesions or rashes.  Neurologic: Alert and oriented x 3.  Psych: Normal affect. Extremities: No clubbing or cyanosis.  HEENT: Normal.   Assessment/Plan: 1. CAD: s/p anterior MI 11/16 with DES to proximal LAD.  Cardiolite in 5/18 without significant ischemia. No exertional chest pain but he has had some atypical left lateral chest pain predominantly associated with eating.  I do not think that this is cardiac.   - He will continue ASA 81 and atorvastatin 40 daily.  - Now on Plavix 75 mg daily which I would have him continue long-term as long as he does not have bleeding issues.   - If his chest symptoms worsen, would arrange for ETT-Cardiolite.  He will call us if this is the case.  2. Chronic systolic CHF:  Echo 0000000 with EF 35-40%.   He has been in this range since his MI.  Ischemic cardiomyopathy.  Prior CPX with mild to moderate limitation due to HF. Cardiac MRI with peri-apical scar and EF 44%. Echo in 5/19 with EF 35-40%.  CPX showed minimal HF limitation, normal PFTs.  Echo today showed EF 40-45%, inferior/inferolateral akinesis.  NYHA class II symptoms, he is not volume overloaded.  I do not think that he can tolerate increasing his meds.  - Continue lisinopril 2.5 mg daily.  - Continue spironolactone 25 mg daily.  BMET today.  - Continue Coreg 3.125  mg bid.     - EF out of range for ICD.   3. Hyperlipidemia: Check lipids today.   Followup in 3 months.   Loralie Champagne 12/17/2019

## 2019-12-19 ENCOUNTER — Other Ambulatory Visit (HOSPITAL_COMMUNITY): Payer: Self-pay | Admitting: *Deleted

## 2019-12-19 DIAGNOSIS — I2510 Atherosclerotic heart disease of native coronary artery without angina pectoris: Secondary | ICD-10-CM

## 2019-12-19 DIAGNOSIS — I5022 Chronic systolic (congestive) heart failure: Secondary | ICD-10-CM

## 2019-12-21 ENCOUNTER — Other Ambulatory Visit (HOSPITAL_COMMUNITY): Payer: Self-pay | Admitting: *Deleted

## 2019-12-21 ENCOUNTER — Telehealth (HOSPITAL_COMMUNITY): Payer: Self-pay

## 2019-12-21 DIAGNOSIS — R079 Chest pain, unspecified: Secondary | ICD-10-CM

## 2019-12-21 DIAGNOSIS — I2510 Atherosclerotic heart disease of native coronary artery without angina pectoris: Secondary | ICD-10-CM

## 2019-12-21 NOTE — Telephone Encounter (Signed)
Pt called stating that he was told to call if he wanted to get the stress test done. Pt reports he called two days ago with no response.  Advised order was placed on date of initial cal.  I apologized that he didn't receive a call back to inform him.  Order was placed and advised patient he will be getting a separate call to arrange a date for him to have test done. Verbalized appreciation.

## 2019-12-28 ENCOUNTER — Telehealth (HOSPITAL_COMMUNITY): Payer: Self-pay | Admitting: *Deleted

## 2019-12-28 ENCOUNTER — Other Ambulatory Visit (HOSPITAL_COMMUNITY)
Admission: RE | Admit: 2019-12-28 | Discharge: 2019-12-28 | Disposition: A | Discharge status: Home / Self Care | Payer: BC Managed Care – PPO | Source: Ambulatory Visit | Attending: Cardiology | Admitting: Cardiology

## 2019-12-28 DIAGNOSIS — Z20822 Contact with and (suspected) exposure to covid-19: Secondary | ICD-10-CM | POA: Diagnosis not present

## 2019-12-28 DIAGNOSIS — Z01812 Encounter for preprocedural laboratory examination: Secondary | ICD-10-CM | POA: Diagnosis present

## 2019-12-28 LAB — SARS CORONAVIRUS 2 (TAT 6-24 HRS): SARS Coronavirus 2: NEGATIVE

## 2019-12-28 NOTE — Telephone Encounter (Signed)
Patient given detailed instructions per Myocardial Perfusion Study Information Sheet for the test on 01/01/20 Patient notified to arrive 15 minutes early and that it is imperative to arrive on time for appointment to keep from having the test rescheduled.  If you need to cancel or reschedule your appointment, please call the office within 24 hours of your appointment. . Patient verbalized understanding. Stein[place]ilber, Carlean Crowl Jacqueline    

## 2020-01-01 ENCOUNTER — Other Ambulatory Visit: Payer: Self-pay

## 2020-01-01 ENCOUNTER — Ambulatory Visit (HOSPITAL_COMMUNITY): Payer: BC Managed Care – PPO | Attending: Cardiovascular Disease

## 2020-01-01 DIAGNOSIS — I2510 Atherosclerotic heart disease of native coronary artery without angina pectoris: Secondary | ICD-10-CM | POA: Diagnosis present

## 2020-01-01 DIAGNOSIS — R079 Chest pain, unspecified: Secondary | ICD-10-CM | POA: Insufficient documentation

## 2020-01-01 DIAGNOSIS — Z9861 Coronary angioplasty status: Secondary | ICD-10-CM | POA: Diagnosis present

## 2020-01-01 LAB — MYOCARDIAL PERFUSION IMAGING
Estimated workload: 9.3 METS
Exercise duration (min): 7 min
Exercise duration (sec): 30 s
LV dias vol: 84 mL (ref 62–150)
LV sys vol: 41 mL
MPHR: 155 {beats}/min
Peak HR: 148 {beats}/min
Percent HR: 95 %
Rest HR: 63 {beats}/min
SDS: 0
SRS: 17
SSS: 17
TID: 0.94

## 2020-01-01 MED ORDER — TECHNETIUM TC 99M TETROFOSMIN IV KIT
10.2000 | PACK | Freq: Once | INTRAVENOUS | Status: AC | PRN
  Administered 2020-01-01: 10.2 via INTRAVENOUS
  Filled 2020-01-01: qty 11

## 2020-01-01 MED ORDER — TECHNETIUM TC 99M TETROFOSMIN IV KIT
30.6000 | PACK | Freq: Once | INTRAVENOUS | Status: AC | PRN
  Administered 2020-01-01: 30.6 via INTRAVENOUS
  Filled 2020-01-01: qty 31

## 2020-01-19 ENCOUNTER — Other Ambulatory Visit (HOSPITAL_COMMUNITY): Payer: Self-pay

## 2020-01-19 MED ORDER — NITROGLYCERIN 0.4 MG SL SUBL: SUBLINGUAL_TABLET | SUBLINGUAL | 6 refills | Status: AC

## 2020-02-07 ENCOUNTER — Other Ambulatory Visit (HOSPITAL_COMMUNITY): Payer: Self-pay | Admitting: Cardiology

## 2020-03-06 ENCOUNTER — Other Ambulatory Visit (HOSPITAL_COMMUNITY): Payer: Self-pay | Admitting: Cardiology

## 2020-08-19 ENCOUNTER — Other Ambulatory Visit: Payer: Self-pay

## 2020-08-19 ENCOUNTER — Ambulatory Visit (HOSPITAL_COMMUNITY)
Admission: RE | Admit: 2020-08-19 | Discharge: 2020-08-19 | Disposition: A | Discharge status: Home / Self Care | Payer: BC Managed Care – PPO | Source: Ambulatory Visit | Attending: Cardiology | Admitting: Cardiology

## 2020-08-19 VITALS — BP 105/65 | HR 57 | Wt 149.4 lb

## 2020-08-19 DIAGNOSIS — Z79899 Other long term (current) drug therapy: Secondary | ICD-10-CM | POA: Diagnosis not present

## 2020-08-19 DIAGNOSIS — Z7902 Long term (current) use of antithrombotics/antiplatelets: Secondary | ICD-10-CM | POA: Diagnosis not present

## 2020-08-19 DIAGNOSIS — I255 Ischemic cardiomyopathy: Secondary | ICD-10-CM | POA: Diagnosis not present

## 2020-08-19 DIAGNOSIS — I2510 Atherosclerotic heart disease of native coronary artery without angina pectoris: Secondary | ICD-10-CM | POA: Diagnosis not present

## 2020-08-19 DIAGNOSIS — Z955 Presence of coronary angioplasty implant and graft: Secondary | ICD-10-CM | POA: Insufficient documentation

## 2020-08-19 DIAGNOSIS — Z87891 Personal history of nicotine dependence: Secondary | ICD-10-CM | POA: Diagnosis not present

## 2020-08-19 DIAGNOSIS — I252 Old myocardial infarction: Secondary | ICD-10-CM | POA: Diagnosis not present

## 2020-08-19 DIAGNOSIS — Z7982 Long term (current) use of aspirin: Secondary | ICD-10-CM | POA: Insufficient documentation

## 2020-08-19 DIAGNOSIS — E785 Hyperlipidemia, unspecified: Secondary | ICD-10-CM | POA: Insufficient documentation

## 2020-08-19 DIAGNOSIS — I5022 Chronic systolic (congestive) heart failure: Secondary | ICD-10-CM | POA: Diagnosis present

## 2020-08-19 LAB — BASIC METABOLIC PANEL
Anion gap: 4 — ABNORMAL LOW (ref 5–15)
BUN: 10 mg/dL (ref 8–23)
CO2: 27 mmol/L (ref 22–32)
Calcium: 9 mg/dL (ref 8.9–10.3)
Chloride: 105 mmol/L (ref 98–111)
Creatinine, Ser: 0.89 mg/dL (ref 0.61–1.24)
GFR calc Af Amer: >60 mL/min (ref >60)
GFR calc non Af Amer: >60 mL/min (ref >60)
Glucose, Bld: 108 mg/dL — ABNORMAL HIGH (ref 70–99)
Potassium: 4 mmol/L (ref 3.5–5.1)
Sodium: 136 mmol/L (ref 135–145)

## 2020-08-19 NOTE — Progress Notes (Signed)
PCP: Dr. Coletta Memos Cardiology: Dr. [name] HF Cardiology: Dr. Aundra Dubin  64 y.o. with history of CAD s/p anterior MI in 11/16 and ischemic cardiomyopathy presents for CHF clinic evaluation.  Patient was in good health until 11/16.  At that time, he had anterior STEMI treated with DES to proximal LAD.  Echo at the time showed EF 30-35%.  Since then, he has had 2 further echoes, both with EF 35-40%, most recently in 9/17.    He has severe OA R knee and is now s/p R TKR with Dr. Ronnie Derby with no complication.   He noted atypical chest pain in 5/18.  Cardiolite showed infarction without significant ischemia.  Cardiac MRI in 5/18 showed EF 44% with peri-apical scar. Echo in 5/19 showed EF 35-40% with wall motion abnormalities. CPX in 1/19 showed very mild HF limitation.    Echo in 7/20 showed EF 40-45% with peri-apical akinesis.  Cardiolite in 2/21 showed EF 51%, no ischemia, prior anteroseptal MI.   He presents today for followup of CAD and CHF.  He has generally been doing well.  Rare atypical chest pain when he "overeats."  No lightheadedness.  He has bilateral knee pain from arthritis that is limiting.  No dyspnea walking on flat ground.  No problems walking up a flight of stairs.  Dyspnea carrying a heavy load.  Weight is stable.   ECG: NSR, low voltage  Labs (2/17): LDL 46 Labs (11/17): K 3.9, creatinine 0.92, LFTs normal Labs (1/18): K 4.2, creatinine 0.92 Labs (5/18): K 4.5, creatinine 0.94, LDL 38, HDL 43 Labs (8/18): K 4.1, creatinine 0.91 Labs (7/19): LDL 42, K 4.1, creatinine 0.97 Labs (7/20): K 4, creatinine 0.9, LDL 42, hgb 14.1  Labs (1/21): K 4.7, creatinine 0.87, hgb 15, LDL 48  PMH: 1. Hyperlipidemia 2. OA: right knee. Right TKR 11/17.  3. CAD: Anterior STEMI 11/16.  Totally occluded LAD on cath => DES to proximal LAD.   - Cardiolite (5/18): EF 35%, large LAD territory infarction with no ischemia.  - Cardiolite (2/21): EF 515, no ischemia, prior anteroseptal MI.  4. Chronic systolic  CHF: Ischemic cardiomyopathy. - 11/16 echo: EF 30-35%  - 2/17 echo: EF 35-40% with regional WMAs.  - 9/17 echo: EF 35-40%, anteroseptal/apical akinesis. - CPX (2/18): peak VO2 19.6, VE/VCO2 slope 30, RER 1.34.  Mild to moderate HF limitation.  - Cardiac MRI (5/18): EF 44%, mid anteroseptal, mid anterior, and peri-apical scar/akinesis, LGE pattern not suggestive of viability.  Mild RV dilation with normal systolic function.  - Echo (5/19): EF 35-40%, septal apical and anterior hypokinesis - CPX (1/20): Very mild HF limitation.  VO2 max 20.6, VE/VCO2 slope 31, RER 1.35, PFTs normal.  - Echo (7/20): EF 40-45%, peri-apical akinesis, normal RV.   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   FH: No premature CAD  ROS: All systems reviewed and negative except as per HPI.   Current Outpatient Medications  Medication Sig Dispense Refill  . acetaminophen (TYLENOL) 500 MG tablet Take 1,000 mg by mouth every 6 (six) hours as needed for mild pain.    Marland Kitchen aspirin 81 MG chewable tablet Chew 1 tablet (81 mg total) by mouth daily.    Marland Kitchen atorvastatin (LIPITOR) 40 MG tablet TAKE 1 TABLET (40 MG TOTAL) BY MOUTH DAILY AT 6 PM. PLEASE SCHEDULE APPOINTMENT FOR FUTURE REFILLS 90 tablet 0  . carvedilol (COREG) 3.125 MG tablet TAKE 1 TABLET BY MOUTH TWICE A DAY 60  tablet 5  . clopidogrel (PLAVIX) 75 MG tablet TAKE 1 TABLET BY MOUTH EVERY DAY 30 tablet 11  . Coenzyme Q10 (COQ10 PO) Take 1 tablet by mouth daily.    Marland Kitchen doxycycline (VIBRAMYCIN) 50 MG capsule Take 50 mg by mouth 2 (two) times daily as needed (for sinus pressure).   3  . gabapentin (NEURONTIN) 300 MG capsule TAKE 1 CAPSULE AT MIDDAY AND 3 CAPSULES AT BEDTIME 120 capsule 0  . lisinopril (ZESTRIL) 2.5 MG tablet TAKE 1 TABLET BY MOUTH TWICE A DAY 180 tablet 3  . Multiple Vitamin (MULTIVITAMIN) tablet Take 1 tablet by mouth daily.    . nitroGLYCERIN (NITROSTAT) 0.4 MG SL tablet PLACE 1 TABLET (0.4 MG TOTAL)  UNDER THE TONGUE EVERY 5 (FIVE) MINUTES X 3 DOSES AS NEEDED FOR CHEST 25 tablet 6  . oxymetazoline (MUCINEX NASAL SPRAY FULL FORCE) 0.05 % nasal spray Place 2 sprays into both nostrils as needed for congestion.    . ranitidine (ZANTAC 75) 75 MG tablet Take 1 tablet (75 mg total) by mouth as needed for heartburn.    . sodium chloride (MURO 128) 5 % ophthalmic ointment Place 1 application into both eyes at bedtime.    Marland Kitchen spironolactone (ALDACTONE) 25 MG tablet TAKE 1 TABLET BY MOUTH EVERY DAY 90 tablet 1  . zolpidem (AMBIEN) 10 MG tablet Take 10 mg by mouth at bedtime.      No current facility-administered medications for this encounter.   BP 105/65   Pulse (!) 57   Wt 67.8 kg (149 lb 6.4 oz)   SpO2 100%   BMI 22.06 kg/m    Wt Readings from Last 3 Encounters:  08/19/20 67.8 kg (149 lb 6.4 oz)  01/01/20 67.6 kg (149 lb)  12/15/19 67.9 kg (149 lb 12.8 oz)   General: NAD Neck: No JVD, no thyromegaly or thyroid nodule.  Lungs: Clear to auscultation bilaterally with normal respiratory effort. CV: Nondisplaced PMI.  Heart regular S1/S2, no S3/S4, no murmur.  No peripheral edema.  No carotid bruit.  Normal pedal pulses.  Abdomen: Soft, nontender, no hepatosplenomegaly, no distention.  Skin: Intact without lesions or rashes.  Neurologic: Alert and oriented x 3.  Psych: Normal affect. Extremities: No clubbing or cyanosis.  HEENT: Normal.   Assessment/Plan: 1. CAD: s/p anterior MI 11/16 with DES to proximal LAD.  Cardiolite in 5/18 and again in 2/21 without ischemia. No exertional chest pain but he has had some atypical left lateral chest pain predominantly associated with eating.  I do not think that this is cardiac.   - He will continue ASA 81 and atorvastatin 40 daily.  - Now on Plavix 75 mg daily which I would have him continue long-term as long as he does not have bleeding issues.   2. Chronic systolic CHF:  Echo 1/61 with EF 35-40%.  He has been in this range since his MI.  Ischemic  cardiomyopathy.  Prior CPX with mild to moderate limitation due to HF. Cardiac MRI with peri-apical scar and EF 44%. Echo in 5/19 with EF 35-40%.  CPX showed minimal HF limitation, normal PFTs.  Echo in 7/20 showed EF 40-45%, inferior/inferolateral akinesis.  NYHA class II symptoms, he is not volume overloaded.  I do not think that he can tolerate increasing his meds with history of low BP and orthostasis.  - Continue lisinopril 2.5 mg bid.  - Continue spironolactone 25 mg daily.  BMET today.  - Continue Coreg 3.125 mg bid.     -  EF out of range for ICD.   - Repeat echo in 4 months at followup.  3. Hyperlipidemia: Good lipids in 1/21.  Followup in 4 months with echo.   Loralie Champagne 08/19/2020

## 2020-08-19 NOTE — Patient Instructions (Signed)
Labs done today, your results will be available in MyChart, we will contact you for abnormal readings.  Your physician recommends that you follow up with an echocardiogram in 4 months  Your physician has requested that you have an echocardiogram. Echocardiography is a painless test that uses sound waves to create images of your heart. It provides your doctor with information about the size and shape of your heart and how well your heart's chambers and valves are working. This procedure takes approximately one hour. There are no restrictions for this procedure.   If you have any questions or concerns before your next appointment please send [place] a message through [place] or call our office at [phone].    TO LEAVE A MESSAGE FOR THE NURSE SELECT OPTION 2, PLEASE LEAVE A MESSAGE INCLUDING: . YOUR NAME . DATE OF BIRTH . CALL BACK NUMBER . REASON FOR CALL**this is important as we prioritize the call backs  [place] AS LONG AS YOU CALL BEFORE 4:00 PM  At the [facility], you and your health needs are our priority. As part of our continuing mission to provide you with exceptional heart care, we have created designated Provider Care Teams. These Care Teams include your primary Cardiologist (physician) and Advanced Practice Providers (APPs- Physician Assistants and Nurse Practitioners) who all work together to provide you with the care you need, when you need it.   You may see any of the following providers on your designated Care Team at your next follow up: Marland Kitchen Dr Glori Bickers . Dr Loralie Champagne . Darrick Grinder, NP . Lyda Jester, PA . Audry Riles, PharmD   Please be sure to bring in all your medications bottles to every appointment.

## 2020-08-25 ENCOUNTER — Other Ambulatory Visit (HOSPITAL_COMMUNITY): Payer: Self-pay | Admitting: Cardiology

## 2020-10-02 ENCOUNTER — Telehealth (HOSPITAL_COMMUNITY): Payer: Self-pay | Admitting: *Deleted

## 2020-10-02 NOTE — Telephone Encounter (Signed)
Pt left vm stating he tested positive for covid19 yesterday and is experiencing some mild symptoms. Pt would like to know if there is anything he should be doing to protect his cardiac health.  Routed to [place]

## 2020-10-03 ENCOUNTER — Other Ambulatory Visit (HOSPITAL_COMMUNITY): Payer: Self-pay

## 2020-10-03 ENCOUNTER — Telehealth: Payer: Self-pay | Admitting: Family

## 2020-10-03 DIAGNOSIS — R69 Illness, unspecified: Secondary | ICD-10-CM

## 2020-10-03 NOTE — Telephone Encounter (Signed)
Called to discuss with [name] about Covid symptoms and potential candidacy for the use of sotrovimab, a combination monoclonal antibody infusion for those with mild to moderate Covid symptoms and at a high risk of hospitalization.     Pt is qualified for this infusion at the infusion center due to co-morbid conditions and/or a member of an at-risk group, however he states he has already scheduled an infusion for tomorrow in [facility].   Serrina Minogue,NP

## 2020-10-03 NOTE — Telephone Encounter (Signed)
If he is symptomatic, should get the COVID-19 antibody infusion.

## 2020-10-04 NOTE — Telephone Encounter (Signed)
Spoke with patient he is scheduled for antibody infusion today.

## 2020-12-20 ENCOUNTER — Encounter (HOSPITAL_COMMUNITY): Payer: Self-pay | Admitting: Cardiology

## 2020-12-20 ENCOUNTER — Other Ambulatory Visit: Payer: Self-pay

## 2020-12-20 ENCOUNTER — Ambulatory Visit (HOSPITAL_COMMUNITY)
Admission: RE | Admit: 2020-12-20 | Discharge: 2020-12-20 | Disposition: A | Discharge status: Home / Self Care | Payer: Managed Care, Other (non HMO) | Source: Ambulatory Visit | Attending: Family Medicine | Admitting: Family Medicine

## 2020-12-20 ENCOUNTER — Ambulatory Visit (HOSPITAL_BASED_OUTPATIENT_CLINIC_OR_DEPARTMENT_OTHER)
Admission: RE | Admit: 2020-12-20 | Discharge: 2020-12-20 | Disposition: A | Discharge status: Home / Self Care | Payer: Managed Care, Other (non HMO) | Source: Ambulatory Visit | Attending: Cardiology | Admitting: Cardiology

## 2020-12-20 VITALS — BP 98/60 | HR 64 | Wt 150.8 lb

## 2020-12-20 DIAGNOSIS — Z8616 Personal history of COVID-19: Secondary | ICD-10-CM | POA: Insufficient documentation

## 2020-12-20 DIAGNOSIS — I2510 Atherosclerotic heart disease of native coronary artery without angina pectoris: Secondary | ICD-10-CM | POA: Insufficient documentation

## 2020-12-20 DIAGNOSIS — I252 Old myocardial infarction: Secondary | ICD-10-CM | POA: Insufficient documentation

## 2020-12-20 DIAGNOSIS — Z87891 Personal history of nicotine dependence: Secondary | ICD-10-CM | POA: Diagnosis not present

## 2020-12-20 DIAGNOSIS — I255 Ischemic cardiomyopathy: Secondary | ICD-10-CM | POA: Insufficient documentation

## 2020-12-20 DIAGNOSIS — Z7982 Long term (current) use of aspirin: Secondary | ICD-10-CM | POA: Diagnosis not present

## 2020-12-20 DIAGNOSIS — I5022 Chronic systolic (congestive) heart failure: Secondary | ICD-10-CM | POA: Insufficient documentation

## 2020-12-20 DIAGNOSIS — M1711 Unilateral primary osteoarthritis, right knee: Secondary | ICD-10-CM | POA: Insufficient documentation

## 2020-12-20 DIAGNOSIS — Z79899 Other long term (current) drug therapy: Secondary | ICD-10-CM | POA: Diagnosis not present

## 2020-12-20 DIAGNOSIS — E785 Hyperlipidemia, unspecified: Secondary | ICD-10-CM | POA: Insufficient documentation

## 2020-12-20 DIAGNOSIS — Z7902 Long term (current) use of antithrombotics/antiplatelets: Secondary | ICD-10-CM | POA: Diagnosis not present

## 2020-12-20 DIAGNOSIS — Z955 Presence of coronary angioplasty implant and graft: Secondary | ICD-10-CM | POA: Diagnosis not present

## 2020-12-20 DIAGNOSIS — Z7984 Long term (current) use of oral hypoglycemic drugs: Secondary | ICD-10-CM | POA: Diagnosis not present

## 2020-12-20 LAB — BASIC METABOLIC PANEL
Anion gap: 8 (ref 5–15)
BUN: 13 mg/dL (ref 8–23)
CO2: 25 mmol/L (ref 22–32)
Calcium: 9.1 mg/dL (ref 8.9–10.3)
Chloride: 104 mmol/L (ref 98–111)
Creatinine, Ser: 0.84 mg/dL (ref 0.61–1.24)
GFR, Estimated: >60 mL/min (ref >60)
Glucose, Bld: 90 mg/dL (ref 70–99)
Potassium: 4.1 mmol/L (ref 3.5–5.1)
Sodium: 137 mmol/L (ref 135–145)

## 2020-12-20 LAB — LIPID PANEL
Cholesterol: 111 mg/dL (ref 0–200)
HDL: 49 mg/dL (ref >40)
LDL Cholesterol: 50 mg/dL (ref 0–99)
Total CHOL/HDL Ratio: 2.3 RATIO
Triglycerides: 59 mg/dL (ref <150)
VLDL: 12 mg/dL (ref 0–40)

## 2020-12-20 LAB — ECHOCARDIOGRAM COMPLETE
Area-P 1/2: 2.56 cm2
Calc EF: 48.4 %
S' Lateral: 3.4 cm
Single Plane A2C EF: 45.8 %
Single Plane A4C EF: 50.6 %

## 2020-12-20 MED ORDER — DAPAGLIFLOZIN PROPANEDIOL 10 MG PO TABS: 10.0000 mg | ORAL_TABLET | Freq: Every day | ORAL | 3 refills | Status: DC

## 2020-12-20 NOTE — Progress Notes (Signed)
Echocardiogram 2D Echocardiogram has been performed.  Oneal Deputy Yonna Alwin 12/20/2020, 8:44 AM

## 2020-12-20 NOTE — Patient Instructions (Addendum)
Labs done today. We will contact you only if your labs are abnormal.  START Farxiga 10mg  (1 tablet) by mouth daily. (a pharmacy card was provided to you at today's visit)   No other medication changes were made. Please continue all current medications as prescribed.  Your physician recommends that you schedule a follow-up appointment in: 10 days for a lab only appointment and in 6 months for an appointment with Dr. Aundra Dubin. Please contact our office in June to schedule a July appointment.   If you have any questions or concerns before your next appointment please send [place] a message through [place] or call our office at [phone].    TO LEAVE A MESSAGE FOR THE NURSE SELECT OPTION 2, PLEASE LEAVE A MESSAGE INCLUDING: . YOUR NAME . DATE OF BIRTH . CALL BACK NUMBER . REASON FOR CALL**this is important as we prioritize the call backs  YOU WILL RECEIVE A CALL BACK THE SAME DAY AS LONG AS YOU CALL BEFORE 4:00 PM   Do the following things EVERYDAY: 1) Weigh yourself in the morning before breakfast. Write it down and keep it in a log. 2) Take your medicines as prescribed 3) Eat low salt foods-Limit salt (sodium) to 2000 mg per day.  4) Stay as active as you can everyday 5) Limit all fluids for the day to less than 2 liters   At the [facility], you and your health needs are our priority. As part of our continuing mission to provide you with exceptional heart care, we have created designated Provider Care Teams. These Care Teams include your primary Cardiologist (physician) and Advanced Practice Providers (APPs- Physician Assistants and Nurse Practitioners) who all work together to provide you with the care you need, when you need it.   You may see any of the following providers on your designated Care Team at your next follow up: Marland Kitchen Dr Glori Bickers . Dr Loralie Champagne . Darrick Grinder, NP . Lyda Jester, PA . Audry Riles, PharmD   Please be sure to bring in all your  medications bottles to every appointment. '

## 2020-12-22 NOTE — Progress Notes (Signed)
PCP: Dr. Coletta Memos Cardiology: Dr. [name] HF Cardiology: Dr. Aundra Dubin  65 y.o. with history of CAD s/p anterior MI in 11/16 and ischemic cardiomyopathy presents for CHF clinic evaluation.  Patient was in good health until 11/16.  At that time, he had anterior STEMI treated with DES to proximal LAD.  Echo at the time showed EF 30-35%.  Since then, he has had 2 further echoes, both with EF 35-40%, most recently in 9/17.    He has severe OA R knee and is now s/p R TKR with Dr. Ronnie Derby with no complication.   He noted atypical chest pain in 5/18.  Cardiolite showed infarction without significant ischemia.  Cardiac MRI in 5/18 showed EF 44% with peri-apical scar. Echo in 5/19 showed EF 35-40% with wall motion abnormalities. CPX in 1/19 showed very mild HF limitation.    Echo in 7/20 showed EF 40-45% with peri-apical akinesis.  Cardiolite in 2/21 showed EF 51%, no ischemia, prior anteroseptal MI.  Echo was done today and reviewed, EF 45-50% with apical akinesis and normal RV.   He presents today for followup of CAD and CHF.  He is doing well.  No chest pain.  He can walk up stairs without dyspnea.  Ok with yardwork.  No lightheadedness though BP continues to run on the lower side.  He is working full time as an Optometrist.    Labs (2/17): LDL 46 Labs (11/17): K 3.9, creatinine 0.92, LFTs normal Labs (1/18): K 4.2, creatinine 0.92 Labs (5/18): K 4.5, creatinine 0.94, LDL 38, HDL 43 Labs (8/18): K 4.1, creatinine 0.91 Labs (7/19): LDL 42, K 4.1, creatinine 0.97 Labs (7/20): K 4, creatinine 0.9, LDL 42, hgb 14.1  Labs (1/21): K 4.7, creatinine 0.87, hgb 15, LDL 48 Labs (9/21): K 4, creatinine 0.89  PMH: 1. Hyperlipidemia 2. OA: right knee. Right TKR 11/17.  3. CAD: Anterior STEMI 11/16.  Totally occluded LAD on cath => DES to proximal LAD.   - Cardiolite (5/18): EF 35%, large LAD territory infarction with no ischemia.  - Cardiolite (2/21): EF 515, no ischemia, prior anteroseptal MI.  4. Chronic systolic  CHF: Ischemic cardiomyopathy. - 11/16 echo: EF 30-35%  - 2/17 echo: EF 35-40% with regional WMAs.  - 9/17 echo: EF 35-40%, anteroseptal/apical akinesis. - CPX (2/18): peak VO2 19.6, VE/VCO2 slope 30, RER 1.34.  Mild to moderate HF limitation.  - Cardiac MRI (5/18): EF 44%, mid anteroseptal, mid anterior, and peri-apical scar/akinesis, LGE pattern not suggestive of viability.  Mild RV dilation with normal systolic function.  - Echo (5/19): EF 35-40%, septal apical and anterior hypokinesis - CPX (1/20): Very mild HF limitation.  VO2 max 20.6, VE/VCO2 slope 31, RER 1.35, PFTs normal.  - Echo (7/20): EF 40-45%, peri-apical akinesis, normal RV.  - Echo (1/22): EF 45-50% with apical akinesis and normal RV.  5. COVID-19 10/21  SH: Lives in [place], works as Optometrist, married, quit smoking > 15 years ago, occasional ETOH.   FH: No premature CAD  ROS: All systems reviewed and negative except as per HPI.   Current Outpatient Medications  Medication Sig Dispense Refill  . acetaminophen (TYLENOL) 500 MG tablet Take 1,000 mg by mouth every 6 (six) hours as needed for mild pain.    Marland Kitchen aspirin 81 MG chewable tablet Chew 1 tablet (81 mg total) by mouth daily.    Marland Kitchen atorvastatin (LIPITOR) 40 MG tablet TAKE 1 TABLET (40 MG TOTAL) BY MOUTH DAILY AT 6 PM. PLEASE SCHEDULE APPOINTMENT FOR FUTURE REFILLS  90 tablet 0  . carvedilol (COREG) 3.125 MG tablet TAKE 1 TABLET BY MOUTH TWICE A DAY 60 tablet 5  . clopidogrel (PLAVIX) 75 MG tablet TAKE 1 TABLET BY MOUTH EVERY DAY 30 tablet 11  . Coenzyme Q10 (COQ10 PO) Take 1 tablet by mouth daily.    . dapagliflozin propanediol (FARXIGA) 10 MG TABS tablet Take 1 tablet (10 mg total) by mouth daily before breakfast. 90 tablet 3  . doxycycline (VIBRAMYCIN) 50 MG capsule Take 50 mg by mouth 2 (two) times daily as needed (for sinus pressure).   3  . gabapentin (NEURONTIN) 300 MG capsule TAKE 1 CAPSULE AT MIDDAY AND 3 CAPSULES AT BEDTIME 120 capsule 0  . lisinopril (ZESTRIL)  2.5 MG tablet TAKE 1 TABLET BY MOUTH TWICE A DAY 180 tablet 3  . Multiple Vitamin (MULTIVITAMIN) tablet Take 1 tablet by mouth daily.    . nitroGLYCERIN (NITROSTAT) 0.4 MG SL tablet PLACE 1 TABLET (0.4 MG TOTAL) UNDER THE TONGUE EVERY 5 (FIVE) MINUTES X 3 DOSES AS NEEDED FOR CHEST 25 tablet 6  . oxymetazoline (AFRIN) 0.05 % nasal spray Place 2 sprays into both nostrils as needed for congestion.    . ranitidine (ZANTAC 75) 75 MG tablet Take 1 tablet (75 mg total) by mouth as needed for heartburn.    . sodium chloride (MURO 128) 5 % ophthalmic ointment Place 1 application into both eyes at bedtime.    Marland Kitchen spironolactone (ALDACTONE) 25 MG tablet TAKE 1 TABLET BY MOUTH EVERY DAY 90 tablet 3  . zolpidem (AMBIEN) 10 MG tablet Take 10 mg by mouth at bedtime.     No current facility-administered medications for this encounter.   BP 98/60   Pulse 64   Wt 68.4 kg (150 lb 12.8 oz)   SpO2 100%   BMI 22.27 kg/m    Wt Readings from Last 3 Encounters:  12/20/20 68.4 kg (150 lb 12.8 oz)  08/19/20 67.8 kg (149 lb 6.4 oz)  01/01/20 67.6 kg (149 lb)   General: NAD Neck: No JVD, no thyromegaly or thyroid nodule.  Lungs: Clear to auscultation bilaterally with normal respiratory effort. CV: Nondisplaced PMI.  Heart regular S1/S2, no S3/S4, no murmur.  No peripheral edema.  No carotid bruit.  Normal pedal pulses.  Abdomen: Soft, nontender, no hepatosplenomegaly, no distention.  Skin: Intact without lesions or rashes.  Neurologic: Alert and oriented x 3.  Psych: Normal affect. Extremities: No clubbing or cyanosis.  HEENT: Normal.   Assessment/Plan: 1. CAD: s/p anterior MI 11/16 with DES to proximal LAD.  Cardiolite in 5/18 and again in 2/21 without ischemia. No recent chest pain.  - He will continue ASA 81 and atorvastatin 40 daily.  - Now on Plavix 75 mg daily which I would have him continue long-term as long as he does not have bleeding issues.   2. Chronic systolic CHF:  Echo 2/94 with EF 35-40%.  He  has been in this range since his MI.  Ischemic cardiomyopathy.  Prior CPX with mild to moderate limitation due to HF. Cardiac MRI with peri-apical scar and EF 44%. Echo in 5/19 with EF 35-40%.  CPX showed minimal HF limitation, normal PFTs.  Echo in 7/20 showed EF 40-45% and echo today showed EF 45-50% with apical akinesis.  NYHA class II symptoms, he is not volume overloaded. BP continues to run on the low side but he is tolerating his HF meds.  - Continue lisinopril 2.5 mg bid.  - Continue spironolactone 25 mg daily.  BMET today.  - Continue Coreg 3.125 mg bid.     - EF out of range for ICD.   - I will start him on Jardiance with BMET in 10 days.  3. Hyperlipidemia: Check lipids today.   Followup in 6 months.   Loralie Champagne 12/22/2020

## 2021-01-01 ENCOUNTER — Other Ambulatory Visit: Payer: Self-pay

## 2021-01-01 ENCOUNTER — Ambulatory Visit (HOSPITAL_COMMUNITY)
Admission: RE | Admit: 2021-01-01 | Discharge: 2021-01-01 | Disposition: A | Discharge status: Home / Self Care | Payer: Managed Care, Other (non HMO) | Source: Ambulatory Visit | Attending: Internal Medicine | Admitting: Internal Medicine

## 2021-01-01 DIAGNOSIS — I5022 Chronic systolic (congestive) heart failure: Secondary | ICD-10-CM | POA: Diagnosis not present

## 2021-01-01 LAB — BASIC METABOLIC PANEL
Anion gap: 9 (ref 5–15)
BUN: 12 mg/dL (ref 8–23)
CO2: 27 mmol/L (ref 22–32)
Calcium: 9.2 mg/dL (ref 8.9–10.3)
Chloride: 102 mmol/L (ref 98–111)
Creatinine, Ser: 1.05 mg/dL (ref 0.61–1.24)
GFR, Estimated: >60 mL/min (ref >60)
Glucose, Bld: 143 mg/dL — ABNORMAL HIGH (ref 70–99)
Potassium: 4.2 mmol/L (ref 3.5–5.1)
Sodium: 138 mmol/L (ref 135–145)

## 2021-06-19 ENCOUNTER — Encounter (HOSPITAL_COMMUNITY): Payer: Self-pay | Admitting: Cardiology

## 2021-06-19 ENCOUNTER — Ambulatory Visit (HOSPITAL_COMMUNITY)
Admission: RE | Admit: 2021-06-19 | Discharge: 2021-06-19 | Disposition: A | Discharge status: Home / Self Care | Payer: Managed Care, Other (non HMO) | Source: Ambulatory Visit | Attending: Cardiology | Admitting: Cardiology

## 2021-06-19 ENCOUNTER — Other Ambulatory Visit: Payer: Self-pay

## 2021-06-19 VITALS — BP 90/60 | HR 57 | Wt 146.0 lb

## 2021-06-19 DIAGNOSIS — Z7902 Long term (current) use of antithrombotics/antiplatelets: Secondary | ICD-10-CM | POA: Diagnosis not present

## 2021-06-19 DIAGNOSIS — I252 Old myocardial infarction: Secondary | ICD-10-CM | POA: Diagnosis not present

## 2021-06-19 DIAGNOSIS — Z79899 Other long term (current) drug therapy: Secondary | ICD-10-CM | POA: Insufficient documentation

## 2021-06-19 DIAGNOSIS — I5022 Chronic systolic (congestive) heart failure: Secondary | ICD-10-CM | POA: Diagnosis present

## 2021-06-19 DIAGNOSIS — Z87891 Personal history of nicotine dependence: Secondary | ICD-10-CM | POA: Diagnosis not present

## 2021-06-19 DIAGNOSIS — I2510 Atherosclerotic heart disease of native coronary artery without angina pectoris: Secondary | ICD-10-CM | POA: Insufficient documentation

## 2021-06-19 DIAGNOSIS — Z955 Presence of coronary angioplasty implant and graft: Secondary | ICD-10-CM | POA: Diagnosis not present

## 2021-06-19 DIAGNOSIS — I255 Ischemic cardiomyopathy: Secondary | ICD-10-CM | POA: Insufficient documentation

## 2021-06-19 DIAGNOSIS — E785 Hyperlipidemia, unspecified: Secondary | ICD-10-CM | POA: Diagnosis not present

## 2021-06-19 DIAGNOSIS — Z7982 Long term (current) use of aspirin: Secondary | ICD-10-CM | POA: Insufficient documentation

## 2021-06-19 DIAGNOSIS — Z8616 Personal history of COVID-19: Secondary | ICD-10-CM | POA: Insufficient documentation

## 2021-06-19 LAB — BASIC METABOLIC PANEL
Anion gap: 7 (ref 5–15)
BUN: 12 mg/dL (ref 8–23)
CO2: 28 mmol/L (ref 22–32)
Calcium: 8.9 mg/dL (ref 8.9–10.3)
Chloride: 102 mmol/L (ref 98–111)
Creatinine, Ser: 0.84 mg/dL (ref 0.61–1.24)
GFR, Estimated: >60 mL/min (ref >60)
Glucose, Bld: 87 mg/dL (ref 70–99)
Potassium: 4.1 mmol/L (ref 3.5–5.1)
Sodium: 137 mmol/L (ref 135–145)

## 2021-06-19 NOTE — Patient Instructions (Signed)
It was great to see you today! No medication changes are needed at this time.  Labs today We will only contact you if something comes back abnormal or we need to make some changes. Otherwise no news is good news!  Your physician recommends that you schedule a follow-up appointment in: 6 months  in the Advanced Practitioners (PA/NP) Clinic    Do the following things EVERYDAY: Weigh yourself in the morning before breakfast. Write it down and keep it in a log. Take your medicines as prescribed Eat low salt foods--Limit salt (sodium) to 2000 mg per day.  Stay as active as you can everyday Limit all fluids for the day to less than 2 liters  milAt the [facility], you and your health needs are our priority. As part of our continuing mission to provide you with exceptional heart care, we have created designated Provider Care Teams. These Care Teams include your primary Cardiologist (physician) and Advanced Practice Providers (APPs- Physician Assistants and Nurse Practitioners) who all work together to provide you with the care you need, when you need it.   You may see any of the following providers on your designated Care Team at your next follow up: Dr Glori Bickers Dr Loralie Champagne Dr Patrice Paradise, NP Lyda Jester, [place] Ginnie Smart Audry Riles, PharmD   Please be sure to bring in all your medications bottles to every appointment.

## 2021-06-19 NOTE — Progress Notes (Signed)
PCP: Dr. Coletta Memos Cardiology: Dr. [name] HF Cardiology: Dr. Aundra Dubin  65 y.o. with history of CAD s/p anterior MI in 11/16 and ischemic cardiomyopathy presents for CHF clinic evaluation.  Patient was in good health until 11/16.  At that time, he had anterior STEMI treated with DES to proximal LAD.  Echo at the time showed EF 30-35%.  Since then, he has had 2 further echoes, both with EF 35-40%, most recently in 9/17.    He has severe OA R knee and is now s/p R TKR with Dr. Ronnie Derby with no complication.   He noted atypical chest pain in 5/18.  Cardiolite showed infarction without significant ischemia.  Cardiac MRI in 5/18 showed EF 44% with peri-apical scar. Echo in 5/19 showed EF 35-40% with wall motion abnormalities. CPX in 1/19 showed very mild HF limitation.    Echo in 7/20 showed EF 40-45% with peri-apical akinesis.  Cardiolite in 2/21 showed EF 51%, no ischemia, prior anteroseptal MI.  Echo in 1/22 showed EF 45-50% with apical akinesis and normal RV.   He presents today for followup of CAD and CHF.  No chest pain.  Short of breath only with heavy exertion in the heat.  No orthopnea/PND.  Climbs up and down stairs several times a day with no problems.  No lightheadedness despite soft BP.   Labs (2/17): LDL 46 Labs (11/17): K 3.9, creatinine 0.92, LFTs normal Labs (1/18): K 4.2, creatinine 0.92 Labs (5/18): K 4.5, creatinine 0.94, LDL 38, HDL 43 Labs (8/18): K 4.1, creatinine 0.91 Labs (7/19): LDL 42, K 4.1, creatinine 0.97 Labs (7/20): K 4, creatinine 0.9, LDL 42, hgb 14.1  Labs (1/21): K 4.7, creatinine 0.87, hgb 15, LDL 48 Labs (9/21): K 4, creatinine 0.89 Labs (1/22): LDL 50, HDL 49 Labs (2/22): K 4.2, creatinine 1.05  PMH: 1. Hyperlipidemia 2. OA: right knee. Right TKR 11/17.  3. CAD: Anterior STEMI 11/16.  Totally occluded LAD on cath => DES to proximal LAD.   - Cardiolite (5/18): EF 35%, large LAD territory infarction with no ischemia.  - Cardiolite (2/21): EF 515, no ischemia,  prior anteroseptal MI.  4. Chronic systolic CHF: Ischemic cardiomyopathy. - 11/16 echo: EF 30-35%  - 2/17 echo: EF 35-40% with regional WMAs.  - 9/17 echo: EF 35-40%, anteroseptal/apical akinesis. - CPX (2/18): peak VO2 19.6, VE/VCO2 slope 30, RER 1.34.  Mild to moderate HF limitation.  - Cardiac MRI (5/18): EF 44%, mid anteroseptal, mid anterior, and peri-apical scar/akinesis, LGE pattern not suggestive of viability.  Mild RV dilation with normal systolic function.  - Echo (5/19): EF 35-40%, septal apical and anterior hypokinesis - CPX (1/20): Very mild HF limitation.  VO2 max 20.6, VE/VCO2 slope 31, RER 1.35, PFTs normal.  - Echo (7/20): EF 40-45%, peri-apical akinesis, normal RV.  - Echo (1/22): EF 45-50% with apical akinesis and normal RV.  5. COVID-19 10/21  SH: Lives in [place], works as Optometrist, married, quit smoking > 15 years ago, occasional ETOH.   FH: No premature CAD  ROS: All systems reviewed and negative except as per HPI.   Current Outpatient Medications  Medication Sig Dispense Refill   acetaminophen (TYLENOL) 500 MG tablet Take 1,000 mg by mouth every 6 (six) hours as needed for mild pain.     aspirin 81 MG chewable tablet Chew 1 tablet (81 mg total) by mouth daily.     atorvastatin (LIPITOR) 40 MG tablet TAKE 1 TABLET (40 MG TOTAL) BY MOUTH DAILY AT 6 PM. PLEASE SCHEDULE  APPOINTMENT FOR FUTURE REFILLS 90 tablet 0   carvedilol (COREG) 3.125 MG tablet TAKE 1 TABLET BY MOUTH TWICE A DAY 60 tablet 5   clopidogrel (PLAVIX) 75 MG tablet TAKE 1 TABLET BY MOUTH EVERY DAY 30 tablet 11   dapagliflozin propanediol (FARXIGA) 10 MG TABS tablet Take 1 tablet (10 mg total) by mouth daily before breakfast. 90 tablet 3   doxycycline (VIBRAMYCIN) 50 MG capsule Take 50 mg by mouth 2 (two) times daily as needed (for sinus pressure).   3   gabapentin (NEURONTIN) 300 MG capsule TAKE 1 CAPSULE AT MIDDAY AND 3 CAPSULES AT BEDTIME 120 capsule 0   lisinopril (ZESTRIL) 2.5 MG tablet TAKE 1  TABLET BY MOUTH TWICE A DAY 180 tablet 3   Multiple Vitamin (MULTIVITAMIN) tablet Take 1 tablet by mouth daily.     nitroGLYCERIN (NITROSTAT) 0.4 MG SL tablet PLACE 1 TABLET (0.4 MG TOTAL) UNDER THE TONGUE EVERY 5 (FIVE) MINUTES X 3 DOSES AS NEEDED FOR CHEST 25 tablet 6   oxymetazoline (AFRIN) 0.05 % nasal spray Place 2 sprays into both nostrils as needed for congestion.     ranitidine (ZANTAC 75) 75 MG tablet Take 1 tablet (75 mg total) by mouth as needed for heartburn.     sodium chloride (MURO 128) 5 % ophthalmic ointment Place 1 application into both eyes at bedtime.     spironolactone (ALDACTONE) 25 MG tablet TAKE 1 TABLET BY MOUTH EVERY DAY 90 tablet 3   zolpidem (AMBIEN) 10 MG tablet Take 10 mg by mouth at bedtime.     Coenzyme Q10 (COQ10 PO) Take 1 tablet by mouth daily.     No current facility-administered medications for this encounter.   BP 90/60   Pulse (!) 57   Wt 66.2 kg (146 lb)   SpO2 100%   BMI 21.56 kg/m    Wt Readings from Last 3 Encounters:  06/19/21 66.2 kg (146 lb)  12/20/20 68.4 kg (150 lb 12.8 oz)  08/19/20 67.8 kg (149 lb 6.4 oz)   General: NAD Neck: No JVD, no thyromegaly or thyroid nodule.  Lungs: Clear to auscultation bilaterally with normal respiratory effort. CV: Nondisplaced PMI.  Heart regular S1/S2, no S3/S4, no murmur.  No peripheral edema.  No carotid bruit.  Normal pedal pulses.  Abdomen: Soft, nontender, no hepatosplenomegaly, no distention.  Skin: Intact without lesions or rashes.  Neurologic: Alert and oriented x 3.  Psych: Normal affect. Extremities: No clubbing or cyanosis.  HEENT: Normal.   Assessment/Plan: 1. CAD: s/p anterior MI 11/16 with DES to proximal LAD.  Cardiolite in 5/18 and again in 2/21 without ischemia. No recent chest pain.  - He will continue ASA 81 and atorvastatin 40 daily.  - Now on Plavix 75 mg daily which I would have him continue long-term as long as he does not have bleeding issues.   2. Chronic systolic CHF:   Echo 0000000 with EF 35-40%.  He has been in this range since his MI.  Ischemic cardiomyopathy.  Prior CPX with mild to moderate limitation due to HF. Cardiac MRI with peri-apical scar and EF 44%. Echo in 5/19 with EF 35-40%.  CPX showed minimal HF limitation, normal PFTs.  Echo in 7/20 showed EF 40-45% and echo in 1/22 showed EF 45-50% with apical akinesis.  NYHA class II symptoms, he is not volume overloaded. BP continues to run on the low side but he is tolerating his HF meds.  - Continue lisinopril 2.5 mg bid.  - Continue  spironolactone 25 mg daily.  BMET today.  - Continue Coreg 3.125 mg bid.     - EF out of range for ICD.   - Continue Farxiga 10 mg daily.  3. Hyperlipidemia: Good lipids in 1/22.   Followup in 6 months with APP.   Loralie Champagne 06/19/2021

## 2021-08-22 ENCOUNTER — Other Ambulatory Visit (HOSPITAL_COMMUNITY): Payer: Self-pay | Admitting: Cardiology

## 2021-09-15 NOTE — Progress Notes (Addendum)
PCP: Dr. Coletta Memos Cardiology: Dr. [name] HF Cardiology: Dr. Aundra Dubin  65 y.o. with history of CAD s/p anterior MI in 11/16 and ischemic cardiomyopathy presents for CHF clinic evaluation.  Patient was in good health until 11/16.  At that time, he had anterior STEMI treated with DES to proximal LAD.  Echo at the time showed EF 30-35%.  Since then, he has had 2 further echoes, both with EF 35-40%, most recently in 9/17.    He has severe OA R knee and is now s/p R TKR with Dr. Ronnie Derby with no complication.   He noted atypical chest pain in 5/18.  Cardiolite showed infarction without significant ischemia.  Cardiac MRI in 5/18 showed EF 44% with peri-apical scar. Echo in 5/19 showed EF 35-40% with wall motion abnormalities. CPX in 1/19 showed very mild HF limitation.    Echo in 7/20 showed EF 40-45% with peri-apical akinesis.  Cardiolite in 2/21 showed EF 51%, no ischemia, prior anteroseptal MI.  Echo in 1/22 showed EF 45-50% with apical akinesis and normal RV.   Follow up 7/22 stable NYHA II symptoms, no chest pain.  Today he returns for HF follow up. His main complaint is left ribcage discomfort that is achy. This has been on-going since his MI in 2016. Pain is usually worse after sitting all day at work and after eating a big meal, he gets some relief with rest. Having bowel movements although not very regular.Denies acid reflux symptoms or trauma to area. He goes up and down stairs and able to carry groceries without significant dyspnea. He took his boat out last week and is able to play with his young grandchildren. Has not tried any relief measures other than rest. Denies abnormal bleeding, palpitations, dizziness, edema, or PND/Orthopnea. Appetite ok. No fever or chills. He does not check weight at home. Taking all medications.   ECG (personally reviewed): SR no significant changes from prior.  Labs (2/17): LDL 46 Labs (11/17): K 3.9, creatinine 0.92, LFTs normal Labs (1/18): K 4.2, creatinine  0.92 Labs (5/18): K 4.5, creatinine 0.94, LDL 38, HDL 43 Labs (8/18): K 4.1, creatinine 0.91 Labs (7/19): LDL 42, K 4.1, creatinine 0.97 Labs (7/20): K 4, creatinine 0.9, LDL 42, hgb 14.1  Labs (1/21): K 4.7, creatinine 0.87, hgb 15, LDL 48 Labs (9/21): K 4, creatinine 0.89 Labs (1/22): LDL 50, HDL 49 Labs (2/22): K 4.2, creatinine 1.05 Labs (7/22): K 4.1, creatinine 0.84  PMH: 1. Hyperlipidemia 2. OA: right knee. Right TKR 11/17.  3. CAD: Anterior STEMI 11/16.  Totally occluded LAD on cath => DES to proximal LAD.   - Cardiolite (5/18): EF 35%, large LAD territory infarction with no ischemia.  - Cardiolite (2/21): EF 515, no ischemia, prior anteroseptal MI.  4. Chronic systolic CHF: Ischemic cardiomyopathy. - 11/16 echo: EF 30-35%  - 2/17 echo: EF 35-40% with regional WMAs.  - 9/17 echo: EF 35-40%, anteroseptal/apical akinesis. - CPX (2/18): peak VO2 19.6, VE/VCO2 slope 30, RER 1.34.  Mild to moderate HF limitation.  - Cardiac MRI (5/18): EF 44%, mid anteroseptal, mid anterior, and peri-apical scar/akinesis, LGE pattern not suggestive of viability.  Mild RV dilation with normal systolic function.  - Echo (5/19): EF 35-40%, septal apical and anterior hypokinesis - CPX (1/20): Very mild HF limitation.  VO2 max 20.6, VE/VCO2 slope 31, RER 1.35, PFTs normal.  - Echo (7/20): EF 40-45%, peri-apical akinesis, normal RV.  - Echo (1/22): EF 45-50% with apical akinesis and normal RV.  5. COVID-19 10/21  SH: Lives in [place], works as Optometrist, married, quit smoking > 15 years ago, occasional ETOH.   FH: No premature CAD  ROS: All systems reviewed and negative except as per HPI.   Current Outpatient Medications  Medication Sig Dispense Refill   acetaminophen (TYLENOL) 500 MG tablet Take 1,000 mg by mouth every 6 (six) hours as needed for mild pain.     aspirin 81 MG chewable tablet Chew 1 tablet (81 mg total) by mouth daily.     atorvastatin (LIPITOR) 40 MG tablet TAKE 1 TABLET (40 MG  TOTAL) BY MOUTH DAILY AT 6 PM. PLEASE SCHEDULE APPOINTMENT FOR FUTURE REFILLS 90 tablet 0   carvedilol (COREG) 3.125 MG tablet TAKE 1 TABLET BY MOUTH TWICE A DAY 60 tablet 5   clopidogrel (PLAVIX) 75 MG tablet TAKE 1 TABLET BY MOUTH EVERY DAY 30 tablet 11   Coenzyme Q10 (COQ10 PO) Take 1 tablet by mouth daily.     dapagliflozin propanediol (FARXIGA) 10 MG TABS tablet Take 1 tablet (10 mg total) by mouth daily before breakfast. 90 tablet 3   doxycycline (VIBRAMYCIN) 50 MG capsule Take 50 mg by mouth 2 (two) times daily as needed (for sinus pressure).   3   gabapentin (NEURONTIN) 300 MG capsule TAKE 1 CAPSULE AT MIDDAY AND 3 CAPSULES AT BEDTIME (Patient taking differently: TAKE 1 CAPSULE AT MIDDAY AND 2- 3 CAPSULES AT BEDTIME) 120 capsule 0   lisinopril (ZESTRIL) 2.5 MG tablet TAKE 1 TABLET BY MOUTH TWICE A DAY 180 tablet 3   Multiple Vitamin (MULTIVITAMIN) tablet Take 1 tablet by mouth daily.     nitroGLYCERIN (NITROSTAT) 0.4 MG SL tablet PLACE 1 TABLET (0.4 MG TOTAL) UNDER THE TONGUE EVERY 5 (FIVE) MINUTES X 3 DOSES AS NEEDED FOR CHEST 25 tablet 6   oxymetazoline (AFRIN) 0.05 % nasal spray Place 2 sprays into both nostrils as needed for congestion.     ranitidine (ZANTAC 75) 75 MG tablet Take 1 tablet (75 mg total) by mouth as needed for heartburn.     sodium chloride (MURO 128) 5 % ophthalmic ointment Place 1 application into both eyes at bedtime.     spironolactone (ALDACTONE) 25 MG tablet TAKE 1 TABLET BY MOUTH EVERY DAY 90 tablet 3   zolpidem (AMBIEN) 10 MG tablet Take 10 mg by mouth at bedtime.     No current facility-administered medications for this encounter.   BP (!) 90/56   Pulse 62   Wt 66.5 kg (146 lb 9.6 oz)   SpO2 100%   BMI 21.65 kg/m    Wt Readings from Last 3 Encounters:  09/16/21 66.5 kg (146 lb 9.6 oz)  06/19/21 66.2 kg (146 lb)  12/20/20 68.4 kg (150 lb 12.8 oz)   General:  NAD. No resp difficulty, mildly anxious HEENT: Normal Neck: Supple. No JVD. Carotids 2+  bilat; no bruits. No lymphadenopathy or thryomegaly appreciated. Cor: PMI nondisplaced. Regular rate & rhythm. No rubs, gallops or murmurs. Lungs: Clear Abdomen: Soft, nontender, nondistended. No hepatosplenomegaly. No bruits or masses. Good bowel sounds. Extremities: No cyanosis, clubbing, rash, edema Neuro: Alert & oriented x 3, cranial nerves grossly intact. Moves all 4 extremities w/o difficulty. Affect pleasant.  Assessment/Plan: 1. CAD: s/p anterior MI 11/16 with DES to proximal LAD.  Cardiolite in 5/18 and again in 2/21 without ischemia. ECG today unchanged, non-ischemic. I do not think his current pain is cardiac related and discussed conservative measures such as heating pad, OTC Tylenol and short trial of PPI. We discussed  repeating stress test and he would like to proceed. Discussed with Dr. Aundra Dubin and will arrange. - He will continue ASA 81 and atorvastatin 40 daily. Check HFTs today.  - Now on Plavix 75 mg daily which I would have him continue long-term as long as he does not have bleeding issues.   2. Chronic systolic CHF:  Echo 3/66 with EF 35-40%.  He has been in this range since his MI.  Ischemic cardiomyopathy.  Prior CPX with mild to moderate limitation due to HF. Cardiac MRI with peri-apical scar and EF 44%. Echo in 5/19 with EF 35-40%.  CPX showed minimal HF limitation, normal PFTs.  Echo in 7/20 showed EF 40-45% and echo in 1/22 showed EF 45-50% with apical akinesis.  NYHA class II symptoms, he is not volume overloaded. BP continues to run on the low side but he is tolerating his HF meds.  - Continue lisinopril 2.5 mg bid.  - Continue spironolactone 25 mg daily.  Labs today. - Continue Coreg 3.125 mg bid.     - EF out of range for ICD.   - Continue Farxiga 10 mg daily.  3. Hyperlipidemia: Good lipids in 1/22.   Will arrange for stress test. Discussed trying OTC tylenol, application of heat and short trial of pantoprazole to see if these improve symptoms, but patient declined.  If stress test negative, will need to follow up with PCP for further work up.  Followup in 6 months with Dr. Wynema Birch [facility] FNP 09/16/2021

## 2021-09-16 ENCOUNTER — Encounter (HOSPITAL_COMMUNITY): Payer: Self-pay | Admitting: Cardiology

## 2021-09-16 ENCOUNTER — Ambulatory Visit (HOSPITAL_COMMUNITY)
Admission: RE | Admit: 2021-09-16 | Discharge: 2021-09-16 | Disposition: A | Discharge status: Home / Self Care | Payer: Managed Care, Other (non HMO) | Source: Ambulatory Visit | Attending: Family Medicine | Admitting: Family Medicine

## 2021-09-16 ENCOUNTER — Encounter (HOSPITAL_COMMUNITY): Payer: Self-pay

## 2021-09-16 ENCOUNTER — Other Ambulatory Visit: Payer: Self-pay

## 2021-09-16 VITALS — BP 90/56 | HR 62 | Wt 146.6 lb

## 2021-09-16 DIAGNOSIS — Z87891 Personal history of nicotine dependence: Secondary | ICD-10-CM | POA: Insufficient documentation

## 2021-09-16 DIAGNOSIS — Z7984 Long term (current) use of oral hypoglycemic drugs: Secondary | ICD-10-CM | POA: Insufficient documentation

## 2021-09-16 DIAGNOSIS — I252 Old myocardial infarction: Secondary | ICD-10-CM | POA: Insufficient documentation

## 2021-09-16 DIAGNOSIS — E785 Hyperlipidemia, unspecified: Secondary | ICD-10-CM | POA: Diagnosis not present

## 2021-09-16 DIAGNOSIS — Z9861 Coronary angioplasty status: Secondary | ICD-10-CM

## 2021-09-16 DIAGNOSIS — Z96651 Presence of right artificial knee joint: Secondary | ICD-10-CM | POA: Insufficient documentation

## 2021-09-16 DIAGNOSIS — Z7982 Long term (current) use of aspirin: Secondary | ICD-10-CM | POA: Insufficient documentation

## 2021-09-16 DIAGNOSIS — I255 Ischemic cardiomyopathy: Secondary | ICD-10-CM | POA: Insufficient documentation

## 2021-09-16 DIAGNOSIS — Z8616 Personal history of COVID-19: Secondary | ICD-10-CM | POA: Insufficient documentation

## 2021-09-16 DIAGNOSIS — Z955 Presence of coronary angioplasty implant and graft: Secondary | ICD-10-CM | POA: Diagnosis not present

## 2021-09-16 DIAGNOSIS — R079 Chest pain, unspecified: Secondary | ICD-10-CM | POA: Diagnosis not present

## 2021-09-16 DIAGNOSIS — Z79899 Other long term (current) drug therapy: Secondary | ICD-10-CM | POA: Insufficient documentation

## 2021-09-16 DIAGNOSIS — I5022 Chronic systolic (congestive) heart failure: Secondary | ICD-10-CM

## 2021-09-16 DIAGNOSIS — I2510 Atherosclerotic heart disease of native coronary artery without angina pectoris: Secondary | ICD-10-CM

## 2021-09-16 DIAGNOSIS — Z7902 Long term (current) use of antithrombotics/antiplatelets: Secondary | ICD-10-CM | POA: Insufficient documentation

## 2021-09-16 DIAGNOSIS — M1711 Unilateral primary osteoarthritis, right knee: Secondary | ICD-10-CM | POA: Diagnosis not present

## 2021-09-16 LAB — COMPREHENSIVE METABOLIC PANEL
ALT: 19 U/L (ref 0–44)
AST: 28 U/L (ref 15–41)
Albumin: 3.5 g/dL (ref 3.5–5.0)
Alkaline Phosphatase: 41 U/L (ref 38–126)
Anion gap: 6 (ref 5–15)
BUN: 15 mg/dL (ref 8–23)
CO2: 27 mmol/L (ref 22–32)
Calcium: 8.9 mg/dL (ref 8.9–10.3)
Chloride: 104 mmol/L (ref 98–111)
Creatinine, Ser: 0.88 mg/dL (ref 0.61–1.24)
GFR, Estimated: >60 mL/min (ref >60)
Glucose, Bld: 97 mg/dL (ref 70–99)
Potassium: 3.8 mmol/L (ref 3.5–5.1)
Sodium: 137 mmol/L (ref 135–145)
Total Bilirubin: 0.8 mg/dL (ref 0.3–1.2)
Total Protein: 6 g/dL — ABNORMAL LOW (ref 6.5–8.1)

## 2021-09-16 MED ORDER — PANTOPRAZOLE SODIUM 40 MG PO TBEC: 40.0000 mg | DELAYED_RELEASE_TABLET | Freq: Every day | ORAL | 1 refills | Status: AC

## 2021-09-16 NOTE — Addendum Note (Signed)
Encounter addended by: Rafael Bihari, FNP on: 09/16/2021 9:56 AM  Actions taken: Clinical Note Signed

## 2021-09-16 NOTE — Patient Instructions (Addendum)
It was great to see you today! No medication changes are needed at this time.  Labs today We will only contact you if something comes back abnormal or we need to make some changes. Otherwise no news is good news!  Your physician has requested that you have a lexiscan myoview. For further information please visit [email or web address]. Please follow instruction sheet, as given.  Your physician recommends that you schedule a follow-up appointment in: 6 months with Dr Aundra Dubin  Do the following things EVERYDAY: Weigh yourself in the morning before breakfast. Write it down and keep it in a log. Take your medicines as prescribed Eat low salt foods--Limit salt (sodium) to 2000 mg per day.  Stay as active as you can everyday Limit all fluids for the day to less than 2 liters    How to Prepare for Your Myoview Test (stress test):  Please do not take these medications before your test: Coreg  Your remaining medications may be taken with water. Nothing to eat or drink, except water, 4 hours prior to arrival time.  NO caffeine/decaffeinated products, or chocolate 12 hours prior to arrival. [place], please do not wear dresses.  Skirts or pants are approprate, please wear a short sleeve shirt. NO perfume, cologne or lotion Wear comfortable walking shoes.  NO HEELS! Total time is 3 to 4 hours; you may want to bring reading material for the waiting time. Please report to [facility] for your test  What to expect after you arrive:  Once you arrive and check in for your appointment an IV will be started in your arm.  Then the Technoligist will inject a small amount of radioactive tracer.  There will be a 1 hour waiting period after this injection.  A series of pictures will be taken of your heart following this waiting period.  You will be prepped for the stress portion of the test.  During the stress portion of your test you will either walk on a treadmill or receive a small, safe amount of  radioactive tracer injected in your IV.  After the stress portion, there is a short rest period during which time your heart and blood pressure will be monitored.  After the short rest period the Technologist will begin your second set of pictures.  Your doctor will inform you of your test results within 7-10 business days.  In preparation for your appointment, medication and supplies will be purchased.  Appointment availability is limited, so if you need to cancel or reschedule please call the office at [phone] 24 hours in advance to avoid a cancellation fee of $100.00  IF [place], [place] TECHNOLOGIST. C

## 2021-09-26 ENCOUNTER — Telehealth (HOSPITAL_COMMUNITY): Payer: Self-pay | Admitting: Physician Assistant

## 2021-09-26 ENCOUNTER — Telehealth (HOSPITAL_COMMUNITY): Payer: Self-pay | Admitting: Nurse Practitioner

## 2021-09-26 NOTE — Telephone Encounter (Signed)
error 

## 2021-10-08 ENCOUNTER — Other Ambulatory Visit (HOSPITAL_COMMUNITY): Payer: Self-pay | Admitting: Family Medicine

## 2021-10-20 ENCOUNTER — Telehealth (HOSPITAL_COMMUNITY): Payer: Self-pay | Admitting: *Deleted

## 2021-10-20 NOTE — Telephone Encounter (Addendum)
2nd time faxing precert to [facility] for [facility]  Fax # [phone]

## 2021-10-21 ENCOUNTER — Encounter (HOSPITAL_COMMUNITY): Payer: Self-pay | Admitting: Family Medicine

## 2021-10-21 NOTE — Telephone Encounter (Signed)
Novella Rob #[id number] for cpt code [phone] myoview.  Valid 10/20/21-04/18/22

## 2021-12-06 ENCOUNTER — Other Ambulatory Visit (HOSPITAL_COMMUNITY): Payer: Self-pay | Admitting: Cardiology

## 2021-12-23 ENCOUNTER — Encounter (HOSPITAL_COMMUNITY): Payer: Managed Care, Other (non HMO)

## 2021-12-29 LAB — COLOGUARD: COLOGUARD: POSITIVE — AB

## 2022-01-02 ENCOUNTER — Encounter: Payer: Self-pay | Admitting: *Deleted

## 2022-01-07 NOTE — Progress Notes (Unsigned)
01/07/2022 Joseph Riddle [id number] November 20, 1956   ASSESSMENT AND PLAN:  *** There are no diagnoses linked to this encounter.   Future Appointments  Date Time Provider [place]  01/09/2022  8:30 AM Delfina Redwood [facility]GI [facility]  02/24/2022  8:15 AM MC-CV [facility] NM2/TREAD [id number] [facility]  03/11/2022  9:40 AM Larey Dresser, MD MC-HVSC None    Patient Care Team: Bernerd Limbo, MD as PCP - General (Family Medicine) Latanya Maudlin, MD as Attending Physician (Orthopedic Surgery) [name], DPM as Consulting Physician (Podiatry) Myra Rude (Inactive) as Consulting Physician (Gastroenterology)  HISTORY OF PRESENT ILLNESS: 66 y.o. male referred by Bernerd Limbo, MD, with a past medical history of CAD status post STEMI 2016 with DES to LAD on Plavix, low risk stress test 02/54/27, chronic systolic heart failure (05/16/75 45 to 50%), follows Dr. Aundra Dubin and others listed below presents for evaluation of positive Cologuard.  .Patient is on a blood thinner use.  Plavix follows with Dr. Aundra Dubin. Patient {Action; does/does not:19097} complain of GERD.  Patient is on pantoprazole 40 mg once daily. Patient denies dysphagia, nausea, vomiting, melena.  Patient denies change in bowel habits, constipation, diarrhea, hematochezia.  Denies changes in appetite, unintentional weight loss.   Patient {has/denies:315300}  family history of colon cancer or other gastrointestinal malignancies.  08/31/2007 colonoscopy showed 3 mm adenomatous polyp, diverticulosis, recall 5 years  WBC 6.8 HGB 15.0 MCV 92.5 Platelets 298  External labs and notes reviewed this visit: ***  Current Medications:   Current Outpatient Medications (Endocrine & Metabolic):    FARXIGA 10 MG TABS tablet, TAKE 1 TABLET BY MOUTH DAILY BEFORE BREAKFAST.  Current Outpatient Medications (Cardiovascular):    atorvastatin (LIPITOR) 40 MG tablet, TAKE 1 TABLET (40 MG TOTAL) BY MOUTH  DAILY AT 6 PM. PLEASE SCHEDULE APPOINTMENT FOR FUTURE REFILLS   carvedilol (COREG) 3.125 MG tablet, TAKE 1 TABLET BY MOUTH TWICE A DAY   lisinopril (ZESTRIL) 2.5 MG tablet, TAKE 1 TABLET BY MOUTH TWICE A DAY   nitroGLYCERIN (NITROSTAT) 0.4 MG SL tablet, PLACE 1 TABLET (0.4 MG TOTAL) UNDER THE TONGUE EVERY 5 (FIVE) MINUTES X 3 DOSES AS NEEDED FOR CHEST   spironolactone (ALDACTONE) 25 MG tablet, TAKE 1 TABLET BY MOUTH EVERY DAY  Current Outpatient Medications (Respiratory):    oxymetazoline (AFRIN) 0.05 % nasal spray, Place 2 sprays into both nostrils as needed for congestion.  Current Outpatient Medications (Analgesics):    acetaminophen (TYLENOL) 500 MG tablet, Take 1,000 mg by mouth every 6 (six) hours as needed for mild pain.   aspirin 81 MG chewable tablet, Chew 1 tablet (81 mg total) by mouth daily.  Current Outpatient Medications (Hematological):    clopidogrel (PLAVIX) 75 MG tablet, TAKE 1 TABLET BY MOUTH EVERY DAY  Current Outpatient Medications (Other):    Coenzyme Q10 (COQ10 PO), Take 1 tablet by mouth daily.   doxycycline (VIBRAMYCIN) 50 MG capsule, Take 50 mg by mouth 2 (two) times daily as needed (for sinus pressure).    gabapentin (NEURONTIN) 300 MG capsule, TAKE 1 CAPSULE AT MIDDAY AND 3 CAPSULES AT BEDTIME (Patient taking differently: TAKE 1 CAPSULE AT MIDDAY AND 2- 3 CAPSULES AT BEDTIME)   Multiple Vitamin (MULTIVITAMIN) tablet, Take 1 tablet by mouth daily.   pantoprazole (PROTONIX) 40 MG tablet, Take 1 tablet (40 mg total) by mouth daily.   ranitidine (ZANTAC 75) 75 MG tablet, Take 1 tablet (75 mg total) by mouth as needed for heartburn. (Patient not taking: Reported  on 09/16/2021)   sodium chloride (MURO 128) 5 % ophthalmic ointment, Place 1 application into both eyes at bedtime.   zolpidem (AMBIEN) 10 MG tablet, Take 10 mg by mouth at bedtime.  Medical History:  Past Medical History:  Diagnosis Date   Arthritis    Cholelithiasis    Coronary artery disease     Dyslipidemia 09/2015   Dyspnea    Ischemic cardiomyopathy 09/2015   EF 30-35 percent by echo 10/07/2015   STEMI (ST elevation myocardial infarction) ([place]) 10/05/2015   DES LAD, residual diagonal 90%, medical Rx   Tubular adenoma of colon 2008   Allergies: No Known Allergies   Surgical History:  He  has a past surgical history that includes Meniscus repair; Foot surgery (Left, 10/2013); Cardiac catheterization (N/A, 10/05/2015); Cardiac catheterization (10/05/2015); Colonoscopy; Total knee arthroplasty (Right, 10/12/2016); and Coronary stent placement. Family History:  His family history includes Heart disease in his father and maternal grandfather; Rheumatic fever in his mother. Social History:   reports that he quit smoking about 21 years ago. His smoking use included cigarettes. He has never used smokeless tobacco. He reports current alcohol use. He reports that he does not use drugs.  REVIEW OF SYSTEMS  : All other systems reviewed and negative except where noted in the History of Present Illness.   PHYSICAL EXAM: There were no vitals taken for this visit. General:   Pleasant, well developed male in no acute distress Head:  Normocephalic and atraumatic. Eyes: {sclerae:26738},conjunctive {conjuctiva:26739}  Heart:  {HEART EXAM HEM/ONC:21750} Pulm: Clear anteriorly; no wheezing Abdomen:  {BlankSingle:19197::"Distended","Ridged","Soft"}, {BlankSingle:19197::"Flat","Obese"} AB, skin exam {ABDOMEN SKIN EXAM:22649}, {BlankSingle:19197::"Absent","Hyperactive, tinkling","Hypoactive","Sluggish","Normal"} bowel sounds. {Desc; pc desc - abdomen tenderness:5168} tenderness {anatomy; site abdomen:5010}. {BlankMultiple:19196::"Without guarding","With guarding","Without rebound","With rebound"}, {Exam; abdomen organomegaly:15152}. Extremities:  {With/Without:304960234} edema. Msk:  Symmetrical without gross deformities. Peripheral pulses intact.  Neurologic:  Alert and  oriented x4;  grossly normal  neurologically. Skin:   Dry and intact without significant lesions or rashes. Psychiatric: Demonstrates good judgement and reason without abnormal affect or behaviors.   Vladimir Crofts, PA-C 1:29 PM

## 2022-01-09 ENCOUNTER — Ambulatory Visit: Payer: Managed Care, Other (non HMO) | Admitting: Physician Assistant

## 2022-01-09 DIAGNOSIS — Z86010 Personal history of colonic polyps: Secondary | ICD-10-CM

## 2022-01-09 DIAGNOSIS — I5022 Chronic systolic (congestive) heart failure: Secondary | ICD-10-CM

## 2022-01-09 DIAGNOSIS — I2510 Atherosclerotic heart disease of native coronary artery without angina pectoris: Secondary | ICD-10-CM

## 2022-01-19 ENCOUNTER — Other Ambulatory Visit (HOSPITAL_COMMUNITY): Payer: Self-pay | Admitting: Cardiology

## 2022-01-21 ENCOUNTER — Ambulatory Visit (INDEPENDENT_AMBULATORY_CARE_PROVIDER_SITE_OTHER): Payer: Managed Care, Other (non HMO) | Admitting: Nurse Practitioner

## 2022-01-21 ENCOUNTER — Telehealth: Payer: Self-pay

## 2022-01-21 ENCOUNTER — Encounter: Payer: Self-pay | Admitting: Nurse Practitioner

## 2022-01-21 VITALS — BP 100/60 | HR 65 | Ht 69.0 in | Wt 145.4 lb

## 2022-01-21 DIAGNOSIS — R195 Other fecal abnormalities: Secondary | ICD-10-CM

## 2022-01-21 MED ORDER — NA SULFATE-K SULFATE-MG SULF 17.5-3.13-1.6 GM/177ML PO SOLN: 1.0000 | ORAL | 0 refills | Status: DC

## 2022-01-21 NOTE — Progress Notes (Signed)
ASSESSMENT AND PLAN     # 66 yo male with a history of a small ( 3 mm) tubular adenoma in 2008. No interval colonoscopy, now presenting with positive Cologuard --Schedule for a polyp surveillance colonoscopy. The risks and benefits of colonoscopy with possible polypectomy / biopsies were discussed and the patient agrees to proceed. --Last colonoscopy Oct 2008 done without sedation per patient preference. He is okay with getting sedation this time.    # CAD / hx of STEMI s/p DES in 2016 on plavix.  --Hold Plavix for 5 days before procedure - will instruct when and how to resume after procedure. Patient understands that there is a low but real risk of cardiovascular event such as heart attack, stroke, or embolism /  thrombosis, or ischemia while off Plavix. The patient consents to proceed. Will communicate by phone or EMR with patient's prescribing provider to confirm that holding Plavix is reasonable in this case.   # Chronic systolic heart failure. Echo January 2022 was EF 45-50%.    HISTORY OF PRESENT ILLNESS     Chief Complaint : positive Cologuard  Joseph Riddle is a 66 y.o. male with a past medical history significant for CAD  / STEMI s/p stent in 2016 on plavix,  ischemic cardimyopathy / chronic systolic heart failure EF 45-50%, osteoarthritis, COVID 19. See PMH below for any additional history.   Patient known remotely to Dr. Ardis Hughs  ( 2008). He is referred by PCP for positive Cologuard. Patient hasn't seen any blood in his stool. His bowel movements have been slightly less frequent over the last several months.  Averages 2 large BMs a week, decreased from 3-4 a week.  Weight is stable.  No [place] of colon cancer.   Followed by Dr. Aundra Dubin for history of of CAD, CHF. He gives a history of chronic chest pain, unchanged. Last seen by Cardiology 09/13/21, described left rig cage pain. Pain not felt to be cardiac in nature. He takes Plavix for history of cardiac stenting in 2016 but has  held it for procedures before.    ECHOCARDIOGRAM COMPLETE    ECHOCARDIOGRAM REPORT       Patient Name:   Joseph Riddle Date of Exam: 12/20/2020 Medical Rec #:  [id number]   Height:       69.0 in Accession #:    [id number]  Weight:       149.4 lb Date of Birth:  Feb 19, 1956    BSA:          1.825 m Patient Age:    74 years    BP:           113/61 mmHg Patient Gender: M           HR:           66 bpm. Exam Location:  Outpatient  Procedure: 2D Echo, 3D Echo, Color Doppler and Cardiac Doppler  Indications:    U13.24 Chronic systolic (congestive) heart failure   History:        Patient has prior history of Echocardiogram examinations, most                 recent 06/07/2019. CAD; Risk Factors:Dyslipidemia.   Sonographer:    Raquel Sarna Senior RDCS Referring Phys: [place]   1. Left ventricular ejection fraction, by estimation, is 45 to 50%. The left ventricle has mildly decreased function. The left ventricle demonstrates regional wall motion abnormalities with apical akinesis. Left  ventricular diastolic parameters are  consistent with Grade I diastolic dysfunction (impaired relaxation).  2. Right ventricular systolic function is normal. The right ventricular size is normal. Tricuspid regurgitation signal is inadequate for assessing PA pressure.  3. The mitral valve is normal in structure. No evidence of mitral valve regurgitation. No evidence of mitral stenosis.  4. The aortic valve is tricuspid. Aortic valve regurgitation is not visualized. No aortic stenosis is present.  5. The inferior vena cava is normal in size with greater than 50% respiratory variability, suggesting right atrial pressure of 3 mmHg.  FINDINGS  Left Ventricle: Left ventricular ejection fraction, by estimation, is 45 to 50%. The left ventricle has mildly decreased function. The left ventricle demonstrates regional wall motion abnormalities. The left ventricular internal cavity size was normal  in size.  There is no left ventricular hypertrophy. Left ventricular diastolic parameters are consistent with Grade I diastolic dysfunction (impaired relaxation).  Right Ventricle: The right ventricular size is normal. No increase in right ventricular wall thickness. Right ventricular systolic function is normal. Tricuspid regurgitation signal is inadequate for assessing PA pressure.  Left Atrium: Left atrial size was normal in size.  Right Atrium: Right atrial size was normal in size.  Pericardium: There is no evidence of pericardial effusion.  Mitral Valve: The mitral valve is normal in structure. No evidence of mitral valve regurgitation. No evidence of mitral valve stenosis.  Tricuspid Valve: The tricuspid valve is normal in structure. Tricuspid valve regurgitation is not demonstrated.  Aortic Valve: The aortic valve is tricuspid. Aortic valve regurgitation is not visualized. No aortic stenosis is present.  Pulmonic Valve: The pulmonic valve was normal in structure. Pulmonic valve regurgitation is not visualized.  Aorta: The aortic root is normal in size and structure.  Venous: The inferior vena cava is normal in size with greater than 50% respiratory variability, suggesting right atrial pressure of 3 mmHg.  IAS/Shunts: No atrial level shunt detected by color flow Doppler.    LEFT VENTRICLE PLAX 2D LVIDd:         4.40 cm      Diastology LVIDs:         3.40 cm      LV e' medial:    7.29 cm/s LV PW:         0.80 cm      LV E/e' medial:  9.9 LV IVS:        0.90 cm      LV e' lateral:   7.83 cm/s LVOT diam:     2.00 cm      LV E/e' lateral: 9.2 LV SV:         72 LV SV Index:   39 LVOT Area:     3.14 cm   LV Volumes (MOD) LV vol d, MOD A2C: 106.0 ml LV vol d, MOD A4C: 109.0 ml LV vol s, MOD A2C: 57.4 ml LV vol s, MOD A4C: 53.9 ml LV SV MOD A2C:     48.6 ml LV SV MOD A4C:     109.0 ml LV SV MOD BP:      52.1 ml  RIGHT VENTRICLE RV S prime:     11.70 cm/s TAPSE (M-mode): 2.4  cm  LEFT ATRIUM             Index       RIGHT ATRIUM           Index LA diam:        3.20 cm 1.75 cm/m  RA Area:     12.70 cm LA Vol (A2C):   46.6 ml 25.53 ml/m RA Volume:   32.60 ml  17.86 ml/m LA Vol (A4C):   40.8 ml 22.35 ml/m LA Biplane Vol: 44.4 ml 24.33 ml/m  AORTIC VALVE LVOT Vmax:   101.00 cm/s LVOT Vmean:  72.400 cm/s LVOT VTI:    0.229 m   AORTA Ao Root diam: 3.00 cm Ao Asc diam:  3.60 cm  MITRAL VALVE MV Area (PHT): 2.56 cm    SHUNTS MV Decel Time: 296 msec    Systemic VTI:  0.23 m MV E velocity: 72.00 cm/s  Systemic Diam: 2.00 cm MV A velocity: 96.40 cm/s MV E/A ratio:  0.75  Loralie Champagne MD Electronically signed by Loralie Champagne MD Signature Date/Time: 12/20/2020/9:30:45 AM      Final      Past Medical History:  Diagnosis Date   Arthritis    Cholelithiasis    Coronary artery disease    Dyslipidemia 09/2015   Dyspnea    Ischemic cardiomyopathy 09/2015   EF 30-35 percent by echo 10/07/2015   STEMI (ST elevation myocardial infarction) ([place]) 10/05/2015   DES LAD, residual diagonal 90%, medical Rx   Tubular adenoma of colon 2008     Past Surgical History:  Procedure Laterality Date   CARDIAC CATHETERIZATION N/A 10/05/2015   Procedure: Left Heart Cath;  Surgeon: Peter M Martinique, MD;  Location: [facility];  Service: Cardiovascular;  Laterality: N/A;   CARDIAC CATHETERIZATION  10/05/2015   Procedure: Coronary Stent Intervention;  Surgeon: Peter M Martinique, MD;  Location: [facility];  Service: Cardiovascular;;   COLONOSCOPY     CORONARY STENT PLACEMENT     FOOT SURGERY Left 10/2013   Dr. [name]   MENISCUS REPAIR     TOTAL KNEE ARTHROPLASTY Right 10/12/2016   Procedure: RIGHT TOTAL KNEE ARTHROPLASTY;  Surgeon: Vickey Huger, MD;  Location: [facility];  Service: Orthopedics;  Laterality: Right;   Family History  Problem Relation Age of Onset   Heart disease Maternal Grandfather    Rheumatic fever Mother    Heart disease Father     Social History   Tobacco Use   Smoking status: Former    Years: 15.00    Types: Cigarettes    Quit date: 07/24/2000    Years since quitting: 21.5   Smokeless tobacco: Never  Vaping Use   Vaping Use: Never used  Substance Use Topics   Alcohol use: Yes    Alcohol/week: 0.0 standard drinks    Comment: very occasionally - mixed drinks   Drug use: No   Current Outpatient Medications  Medication Sig Dispense Refill   acetaminophen (TYLENOL) 500 MG tablet Take 1,000 mg by mouth every 6 (six) hours as needed for mild pain.     aspirin 81 MG chewable tablet Chew 1 tablet (81 mg total) by mouth daily.     atorvastatin (LIPITOR) 40 MG tablet TAKE 1 TABLET (40 MG TOTAL) BY MOUTH DAILY AT 6 PM. PLEASE SCHEDULE APPOINTMENT FOR FUTURE REFILLS 90 tablet 0   carvedilol (COREG) 3.125 MG tablet TAKE 1 TABLET BY MOUTH TWICE A DAY 60 tablet 5   clopidogrel (PLAVIX) 75 MG tablet TAKE 1 TABLET BY MOUTH EVERY DAY 30 tablet 11   Coenzyme Q10 (COQ10 PO) Take 1 tablet by mouth daily.     doxycycline (VIBRAMYCIN) 50 MG capsule Take 50 mg by mouth 2 (two) times daily as needed (for sinus pressure).  3   FARXIGA 10 MG TABS tablet TAKE 1 TABLET BY MOUTH DAILY BEFORE BREAKFAST. 90 tablet 3   gabapentin (NEURONTIN) 300 MG capsule TAKE 1 CAPSULE AT MIDDAY AND 3 CAPSULES AT BEDTIME (Patient taking differently: TAKE 1 CAPSULE AT MIDDAY AND 2- 3 CAPSULES AT BEDTIME) 120 capsule 0   lisinopril (ZESTRIL) 2.5 MG tablet TAKE 1 TABLET BY MOUTH TWICE A DAY 180 tablet 3   Multiple Vitamin (MULTIVITAMIN) tablet Take 1 tablet by mouth daily.     nitroGLYCERIN (NITROSTAT) 0.4 MG SL tablet PLACE 1 TABLET (0.4 MG TOTAL) UNDER THE TONGUE EVERY 5 (FIVE) MINUTES X 3 DOSES AS NEEDED FOR CHEST 25 tablet 6   oxymetazoline (AFRIN) 0.05 % nasal spray Place 2 sprays into both nostrils as needed for congestion.     pantoprazole (PROTONIX) 40 MG tablet Take 1 tablet (40 mg total) by mouth daily. 30 tablet 1   ranitidine (ZANTAC 75) 75 MG  tablet Take 1 tablet (75 mg total) by mouth as needed for heartburn.     sodium chloride (MURO 128) 5 % ophthalmic ointment Place 1 application into both eyes at bedtime.     spironolactone (ALDACTONE) 25 MG tablet TAKE 1 TABLET BY MOUTH EVERY DAY 90 tablet 3   zolpidem (AMBIEN) 10 MG tablet Take 10 mg by mouth at bedtime.     No current facility-administered medications for this visit.   No Known Allergies   Review of Systems:  All systems reviewed and negative except where noted in HPI.    PHYSICAL EXAM :    Wt Readings from Last 3 Encounters:  01/21/22 145 lb 6 oz (65.9 kg)  09/16/21 146 lb 9.6 oz (66.5 kg)  06/19/21 146 lb (66.2 kg)    BP 100/60    Pulse 65    Ht 5\' 9"  (1.753 m)    Wt 145 lb 6 oz (65.9 kg)    BMI 21.47 kg/m  Constitutional:  Generally well appearing male in no acute distress. Psychiatric: Pleasant. Normal mood and affect. Behavior is normal. EENT: Pupils normal.  Conjunctivae are normal. No scleral icterus. Neck supple.  Cardiovascular: Normal rate, regular rhythm. No edema Pulmonary/chest: Effort normal and breath sounds normal. No wheezing, rales or rhonchi. Abdominal: Soft, nondistended, nontender. Bowel sounds active throughout. There are no masses palpable. No hepatomegaly. Neurological: Alert and oriented to person place and time. Skin: Skin is warm and dry. No rashes noted.  Tye Savoy, NP  01/21/2022, 10:16 AM  Cc:  Referring Provider Bernerd Limbo, MD

## 2022-01-21 NOTE — Telephone Encounter (Signed)
Request for surgical clearance:     Endoscopy Procedure ? ?What type of surgery is being performed?     Colonoscopy ? ?When is this surgery scheduled?     02/17/2022 ? ?What type of clearance is required ?   Pharmacy ? ?Are there any medications that need to be held prior to surgery and how long? Plavix x5 days  ? ?Practice name and name of physician performing surgery?      [facility]-Dr Ardis Hughs  ? ?What is your office phone and fax number?      Phone- [phone]  Fax- [phone] ? ?Anesthesia type (None, local, MAC, general) ?       MAC ? ?

## 2022-01-21 NOTE — Patient Instructions (Signed)
We have sent the following medications to your pharmacy for you to pick up at your convenience: ?[place]  ? ?You have been scheduled for a colonoscopy. Please follow written instructions given to you at your visit today.  ?Please pick up your prep supplies at the pharmacy within the next 1-3 days. ?If you use inhalers (even only as needed), please bring them with you on the day of your procedure. ? ?You will be contaced by our office prior to your procedure for directions on holding your Plavix. If you do not hear from our office 1 week prior to your scheduled procedure, please call [phone] to discuss. ? ?If you are age 68 or older, your body mass index should be between 23-30. Your Body mass index is 21.47 kg/m?Marland Kitchen If this is out of the aforementioned range listed, please consider follow up with your Primary Care Provider. ? ?The [facility] providers would like to encourage you to use [facility] to communicate with providers for non-urgent requests or questions.  Due to long hold times on the telephone, sending your provider a message by [facility] may be a faster and more efficient way to get a response.  Please allow 48 business hours for a response.  Please remember that this is for non-urgent requests.  ? ?Thank you for choosing me and [facility]. ? ?Tye Savoy NP  ? ? ?

## 2022-01-21 NOTE — Telephone Encounter (Signed)
? ? ?  Patient Name: Joseph Riddle  ?DOB: 12-08-1955 ?MRN: [id number] ? ?Primary Cardiologist: None ? ?Chart reviewed as part of pre-operative protocol coverage.  ? ?History of CAD s/p anterior MI in 11/16 treated with DES to proximal LAD.   Cardiac MRI in 5/18 showed EF 44% with peri-apical scar. Echo in 5/19 showed EF 35-40% with wall motion abnormalities. CPX in 1/19 showed very mild HF limitation.   ?  ?Echo in 7/20 showed EF 40-45% with peri-apical akinesis. Cardiolite in 2/21 showed EF 51%, no ischemia, prior anteroseptal MI.  Echo in 1/22 showed EF 45-50% with apical akinesis and normal RV.  ? ?Dr. Aundra Dubin, can patient hold Plavix for 5 days? ? ?Please forward your response to [facility] PREOP.  ? ?Thank you  ? ?Leanor Kail, PA ?01/21/2022, 12:01 PM  ?

## 2022-01-21 NOTE — Progress Notes (Signed)
I agree with the above note, plan 

## 2022-02-04 NOTE — Telephone Encounter (Signed)
Patient has been informed okay to hold Plavix x5 days prior to procedure. Pt voiced understanding.  ?

## 2022-02-16 ENCOUNTER — Telehealth: Payer: Self-pay | Admitting: Gastroenterology

## 2022-02-16 NOTE — Telephone Encounter (Signed)
Patient rescheduled 02/17/22 procedure to 2022-04-16 due to a death in the family.  ? ?Nicole-can you do up new prep instructions for patient and send through mychart. Thank you.  ?

## 2022-02-17 ENCOUNTER — Encounter: Payer: Managed Care, Other (non HMO) | Admitting: Gastroenterology

## 2022-02-17 NOTE — Telephone Encounter (Signed)
New letter sent to patient by MyChart with instructions for prep. ?

## 2022-02-19 ENCOUNTER — Telehealth (HOSPITAL_COMMUNITY): Payer: Self-pay

## 2022-02-19 NOTE — Telephone Encounter (Signed)
Spoke with the patient, detailed instructions given. He stated that he would be here for his test. Asked to call back with any questions. Joseph Riddle EMTP 

## 2022-02-24 ENCOUNTER — Ambulatory Visit (HOSPITAL_COMMUNITY): Payer: Managed Care, Other (non HMO) | Attending: Cardiovascular Disease

## 2022-02-24 DIAGNOSIS — R079 Chest pain, unspecified: Secondary | ICD-10-CM | POA: Insufficient documentation

## 2022-02-24 LAB — MYOCARDIAL PERFUSION IMAGING
Angina Index: 0
Duke Treadmill Score: 8
Estimated workload: 9.6
Exercise duration (min): 7 min
Exercise duration (sec): 42 s
LV dias vol: 90 mL (ref 62–150)
LV sys vol: 56 mL
MPHR: 153 {beats}/min
Nuc Stress EF: 38 %
Peak HR: 153 {beats}/min
Percent HR: 100 %
Rest HR: 62 {beats}/min
Rest Nuclear Isotope Dose: 10.3 mCi
SDS: 2
SRS: 22
SSS: 25
ST Depression (mm): 0 mm
Stress Nuclear Isotope Dose: 32.2 mCi
TID: 0.96

## 2022-02-24 MED ORDER — TECHNETIUM TC 99M TETROFOSMIN IV KIT
10.3000 | PACK | Freq: Once | INTRAVENOUS | Status: AC | PRN
  Administered 2022-02-24: 10.3 via INTRAVENOUS
  Filled 2022-02-24: qty 11

## 2022-02-24 MED ORDER — TECHNETIUM TC 99M TETROFOSMIN IV KIT
32.2000 | PACK | Freq: Once | INTRAVENOUS | Status: AC | PRN
  Administered 2022-02-24: 32.2 via INTRAVENOUS
  Filled 2022-02-24: qty 33

## 2022-03-09 ENCOUNTER — Encounter (HOSPITAL_COMMUNITY): Payer: Managed Care, Other (non HMO) | Admitting: Cardiology

## 2022-03-11 ENCOUNTER — Other Ambulatory Visit (HOSPITAL_COMMUNITY): Payer: Self-pay

## 2022-03-11 ENCOUNTER — Encounter (HOSPITAL_COMMUNITY): Payer: Self-pay | Admitting: Cardiology

## 2022-03-11 ENCOUNTER — Ambulatory Visit (HOSPITAL_COMMUNITY)
Admission: RE | Admit: 2022-03-11 | Discharge: 2022-03-11 | Disposition: A | Discharge status: Home / Self Care | Payer: Managed Care, Other (non HMO) | Source: Ambulatory Visit | Attending: Cardiology | Admitting: Cardiology

## 2022-03-11 VITALS — BP 104/70 | HR 54 | Wt 142.4 lb

## 2022-03-11 DIAGNOSIS — Z7982 Long term (current) use of aspirin: Secondary | ICD-10-CM | POA: Insufficient documentation

## 2022-03-11 DIAGNOSIS — Z79899 Other long term (current) drug therapy: Secondary | ICD-10-CM | POA: Insufficient documentation

## 2022-03-11 DIAGNOSIS — R0609 Other forms of dyspnea: Secondary | ICD-10-CM | POA: Diagnosis not present

## 2022-03-11 DIAGNOSIS — I2510 Atherosclerotic heart disease of native coronary artery without angina pectoris: Secondary | ICD-10-CM | POA: Diagnosis not present

## 2022-03-11 DIAGNOSIS — R079 Chest pain, unspecified: Secondary | ICD-10-CM | POA: Insufficient documentation

## 2022-03-11 DIAGNOSIS — I252 Old myocardial infarction: Secondary | ICD-10-CM | POA: Diagnosis not present

## 2022-03-11 DIAGNOSIS — I255 Ischemic cardiomyopathy: Secondary | ICD-10-CM | POA: Insufficient documentation

## 2022-03-11 DIAGNOSIS — Z7902 Long term (current) use of antithrombotics/antiplatelets: Secondary | ICD-10-CM | POA: Insufficient documentation

## 2022-03-11 DIAGNOSIS — I5022 Chronic systolic (congestive) heart failure: Secondary | ICD-10-CM

## 2022-03-11 DIAGNOSIS — R195 Other fecal abnormalities: Secondary | ICD-10-CM | POA: Diagnosis not present

## 2022-03-11 DIAGNOSIS — E785 Hyperlipidemia, unspecified: Secondary | ICD-10-CM | POA: Diagnosis not present

## 2022-03-11 DIAGNOSIS — Z8616 Personal history of COVID-19: Secondary | ICD-10-CM | POA: Diagnosis not present

## 2022-03-11 DIAGNOSIS — Z955 Presence of coronary angioplasty implant and graft: Secondary | ICD-10-CM | POA: Diagnosis not present

## 2022-03-11 DIAGNOSIS — Z96651 Presence of right artificial knee joint: Secondary | ICD-10-CM | POA: Insufficient documentation

## 2022-03-11 DIAGNOSIS — Z7984 Long term (current) use of oral hypoglycemic drugs: Secondary | ICD-10-CM | POA: Diagnosis not present

## 2022-03-11 LAB — CBC
HCT: 43.6 % (ref 39.0–52.0)
Hemoglobin: 15.3 g/dL (ref 13.0–17.0)
MCH: 32.5 pg (ref 26.0–34.0)
MCHC: 35.1 g/dL (ref 30.0–36.0)
MCV: 92.6 fL (ref 80.0–100.0)
Platelets: 263 10*3/uL (ref 150–400)
RBC: 4.71 MIL/uL (ref 4.22–5.81)
RDW: 11.7 % (ref 11.5–15.5)
WBC: 6.8 10*3/uL (ref 4.0–10.5)
nRBC: 0 % (ref 0.0–0.2)

## 2022-03-11 LAB — BASIC METABOLIC PANEL
Anion gap: 6 (ref 5–15)
BUN: 12 mg/dL (ref 8–23)
CO2: 27 mmol/L (ref 22–32)
Calcium: 8.9 mg/dL (ref 8.9–10.3)
Chloride: 106 mmol/L (ref 98–111)
Creatinine, Ser: 0.74 mg/dL (ref 0.61–1.24)
GFR, Estimated: >60 mL/min (ref >60)
Glucose, Bld: 78 mg/dL (ref 70–99)
Potassium: 3.9 mmol/L (ref 3.5–5.1)
Sodium: 139 mmol/L (ref 135–145)

## 2022-03-11 LAB — LIPID PANEL
Cholesterol: 101 mg/dL (ref 0–200)
HDL: 43 mg/dL (ref >40)
LDL Cholesterol: 50 mg/dL (ref 0–99)
Total CHOL/HDL Ratio: 2.3 RATIO
Triglycerides: 40 mg/dL (ref <150)
VLDL: 8 mg/dL (ref 0–40)

## 2022-03-11 MED ORDER — LISINOPRIL 5 MG PO TABS: 5.0000 mg | ORAL_TABLET | Freq: Every day | ORAL | 3 refills | Status: DC

## 2022-03-11 NOTE — Progress Notes (Signed)
PCP: Dr. Coletta Memos ?Cardiology: Dr. [name] ?HF Cardiology: Dr. Aundra Dubin ? ?66 y.o. with history of CAD s/p anterior MI in 11/16 and ischemic cardiomyopathy presents for CHF clinic evaluation.  Patient was in good health until 11/16.  At that time, he had anterior STEMI treated with DES to proximal LAD.  Echo at the time showed EF 30-35%.  Since then, he has had 2 further echoes, both with EF 35-40%, most recently in 9/17.   ? ?He has severe OA R knee and is now s/p R TKR with Dr. Ronnie Derby with no complication.  ? ?He noted atypical chest pain in 5/18.  Cardiolite showed infarction without significant ischemia.  Cardiac MRI in 5/18 showed EF 44% with peri-apical scar. Echo in 5/19 showed EF 35-40% with wall motion abnormalities. CPX in 1/19 showed very mild HF limitation.   ? ?Echo in 7/20 showed EF 40-45% with peri-apical akinesis.  Cardiolite in 2/21 showed EF 51%, no ischemia, prior anteroseptal MI.  Echo in 1/22 showed EF 45-50% with apical akinesis and normal RV.  ? ?Cardiolite in 4/23 showed EF 38%, old anteroseptal MI with no ischemia.   ? ?He presents today for followup of CAD and CHF.  He has not been doing well.  He has a number of symptoms and is very worried about his recent stress test.  He has left lateral chest pain with exertion, this has significantly increased over the last couple of months.  He noted this walking on the beach last weekend.  He notes it with moderate exertion like yardwork.  He also has been getting significantly more short of breath with exertion.  He got very short of breath recently blowing leaves.  He additionally has had some GI issues with bloating and discomfort.  He had an abnormal Cologuard and needs colonoscopy.  He has been under a lot of stress recently.  ? ?ECG (personally reviewed): NSR, low voltage, old anterior MI.  ? ?Labs (2/17): LDL 46 ?Labs (11/17): K 3.9, creatinine 0.92, LFTs normal ?Labs (1/18): K 4.2, creatinine 0.92 ?Labs (5/18): K 4.5, creatinine 0.94, LDL 38, HDL  43 ?Labs (8/18): K 4.1, creatinine 0.91 ?Labs (7/19): LDL 42, K 4.1, creatinine 0.97 ?Labs (7/20): K 4, creatinine 0.9, LDL 42, hgb 14.1  ?Labs (1/21): K 4.7, creatinine 0.87, hgb 15, LDL 48 ?Labs (9/21): K 4, creatinine 0.89 ?Labs (1/22): LDL 50, HDL 49 ?Labs (2/22): K 4.2, creatinine 1.05 ?Labs (10/22): K 3.8, creatinine 0.88 ? ?PMH: ?1. Hyperlipidemia ?2. OA: right knee. Right TKR 11/17.  ?3. CAD: Anterior STEMI 11/16.  Totally occluded LAD on cath => DES to proximal LAD.   ?- Cardiolite (5/18): EF 35%, large LAD territory infarction with no ischemia.  ?- Cardiolite (2/21): EF 515, no ischemia, prior anteroseptal MI.  ?4. Chronic systolic CHF: Ischemic cardiomyopathy. ?- 11/16 echo: EF 30-35%  ?- 2/17 echo: EF 35-40% with regional WMAs.  ?- 9/17 echo: EF 35-40%, anteroseptal/apical akinesis. ?- CPX (2/18): peak VO2 19.6, VE/VCO2 slope 30, RER 1.34.  Mild to moderate HF limitation.  ?- Cardiac MRI (5/18): EF 44%, mid anteroseptal, mid anterior, and peri-apical scar/akinesis, LGE pattern not suggestive of viability.  Mild RV dilation with normal systolic function.  ?- Echo (5/19): EF 35-40%, septal apical and anterior hypokinesis ?- CPX (1/20): Very mild HF limitation.  VO2 max 20.6, VE/VCO2 slope 31, RER 1.35, PFTs normal.  ?- Echo (7/20): EF 40-45%, peri-apical akinesis, normal RV.  ?- Echo (1/22): EF 45-50% with apical akinesis and normal RV.  ?-  Cardiolite (4/23): EF 38%, old anteroseptal MI with no ischemia. ?5. COVID-19 10/21 ? ?SH: Lives in [place], works as Optometrist, married, quit smoking > 15 years ago, occasional ETOH.  ? ?FH: No premature CAD ? ?ROS: All systems reviewed and negative except as per HPI.  ? ?Current Outpatient Medications  ?Medication Sig Dispense Refill  ? acetaminophen (TYLENOL) 500 MG tablet Take 1,000 mg by mouth every 6 (six) hours as needed for mild pain.    ? aspirin 81 MG chewable tablet Chew 1 tablet (81 mg total) by mouth daily.    ? atorvastatin (LIPITOR) 40 MG tablet TAKE 1  TABLET (40 MG TOTAL) BY MOUTH DAILY AT 6 PM. PLEASE SCHEDULE APPOINTMENT FOR FUTURE REFILLS 90 tablet 0  ? carvedilol (COREG) 3.125 MG tablet TAKE 1 TABLET BY MOUTH TWICE A DAY 60 tablet 5  ? clopidogrel (PLAVIX) 75 MG tablet TAKE 1 TABLET BY MOUTH EVERY DAY 30 tablet 11  ? Coenzyme Q10 (COQ10 PO) Take 1 tablet by mouth daily.    ? doxycycline (VIBRAMYCIN) 50 MG capsule Take 50 mg by mouth 2 (two) times daily as needed (for sinus pressure).   3  ? FARXIGA 10 MG TABS tablet TAKE 1 TABLET BY MOUTH DAILY BEFORE BREAKFAST. 90 tablet 3  ? gabapentin (NEURONTIN) 300 MG capsule TAKE 1 CAPSULE AT MIDDAY AND 3 CAPSULES AT BEDTIME 120 capsule 0  ? Multiple Vitamin (MULTIVITAMIN) tablet Take 1 tablet by mouth daily.    ? nitroGLYCERIN (NITROSTAT) 0.4 MG SL tablet PLACE 1 TABLET (0.4 MG TOTAL) UNDER THE TONGUE EVERY 5 (FIVE) MINUTES X 3 DOSES AS NEEDED FOR CHEST 25 tablet 6  ? oxymetazoline (AFRIN) 0.05 % nasal spray Place 2 sprays into both nostrils as needed for congestion.    ? pantoprazole (PROTONIX) 40 MG tablet Take 1 tablet (40 mg total) by mouth daily. 30 tablet 1  ? ranitidine (ZANTAC 75) 75 MG tablet Take 1 tablet (75 mg total) by mouth as needed for heartburn.    ? sodium chloride (MURO 128) 5 % ophthalmic ointment Place 1 application into both eyes at bedtime.    ? spironolactone (ALDACTONE) 25 MG tablet TAKE 1 TABLET BY MOUTH EVERY DAY 90 tablet 3  ? zolpidem (AMBIEN) 10 MG tablet Take 10 mg by mouth at bedtime.    ? lisinopril (ZESTRIL) 5 MG tablet Take 1 tablet (5 mg total) by mouth daily. 90 tablet 3  ? Na Sulfate-K Sulfate-Mg Sulf (SUPREP BOWEL PREP KIT) 17.5-3.13-1.6 GM/177ML SOLN Take 1 kit by mouth as directed. For colonoscopy prep (Patient not taking: Reported on 03/11/2022) 354 mL 0  ? ?No current facility-administered medications for this encounter.  ? ?BP 104/70   Pulse (!) 54   Wt 64.6 kg (142 lb 6.4 oz)   SpO2 100%   BMI 21.03 kg/m?   ? ?Wt Readings from Last 3 Encounters:  ?03/11/22 64.6 kg (142 lb  6.4 oz)  ?02/24/22 66.2 kg (146 lb)  ?01/21/22 65.9 kg (145 lb 6 oz)  ? ?General: NAD ?Neck: No JVD, no thyromegaly or thyroid nodule.  ?Lungs: Clear to auscultation bilaterally with normal respiratory effort. ?CV: Nondisplaced PMI.  Heart regular S1/S2, no S3/S4, no murmur.  No peripheral edema.  No carotid bruit.  Normal pedal pulses.  ?Abdomen: Soft, nontender, no hepatosplenomegaly, no distention.  ?Skin: Intact without lesions or rashes.  ?Neurologic: Alert and oriented x 3.  ?Psych: Normal affect. ?Extremities: No clubbing or cyanosis.  ?HEENT: Normal.  ? ?Assessment/Plan: ?1. CAD:  s/p anterior MI 11/16 with DES to proximal LAD.  Cardiolite in 5/18, 2/21, and again in 4/23 with old anterior MI, no ischemia. However, patient continues to have exertional lateral chest pain as well as dyspnea.  The EF by 4/23 Cardiolite was lower than in the past.  Symptoms are significantly worse than in the past and are worrying patient.  He says that he is now significantly limited.   ?- He will continue ASA 81 and atorvastatin 40 daily.  ?- Plavix 75 mg daily which I would have him continue long-term as long as he does not have bleeding issues.   ?- Given ongoing and worsening exertional symptoms, I am going to arrange for LHC/RHC to assess coronaries as well as filling pressures and cardiac output. I discussed risks/benefits with the patient and he agrees to procedure.  ?2. Chronic systolic CHF:  Echo 7/98 with EF 35-40%.  He has been in this range since his MI.  Ischemic cardiomyopathy.  Prior CPX with mild to moderate limitation due to HF. Cardiac MRI with peri-apical scar and EF 44%. Echo in 5/19 with EF 35-40%.  CPX showed minimal HF limitation, normal PFTs.  Echo in 7/20 showed EF 40-45% and echo in 1/22 showed EF 45-50% with apical akinesis.  Cardiolite in 4/23 showed lower EF, 38%.  Worsened NYHA class III symptoms, but patient is not volume overloaded on exam. Medication titration has been limited by soft blood  pressure.  ?- Increase lisinopril to 5 mg daily. BMET today and in 10 days.  ?- Continue spironolactone 25 mg daily.    ?- Continue Coreg 3.125 mg bid.     ?- EF has been out of range for ICD.  He does need a r

## 2022-03-11 NOTE — Progress Notes (Signed)
Student Intern completed SDOH screening with Pt. No interventions needed at this time. ? ?Su Grand, MSW student intern ?[facility] ?[place] ?[phone] ? ?

## 2022-03-11 NOTE — Patient Instructions (Signed)
Medication Changes: ? ?Increase Lisinopril to '5mg'$  daily ? ?Lab Work: ? ?Labs done today, your results will be available in MyChart, we will contact you for abnormal readings. ? ? ?Testing/Procedures: ? ?Your physician has requested that you have an echocardiogram. Echocardiography is a painless test that uses sound waves to create images of your heart. It provides your doctor with information about the size and shape of your heart and how well your heart?s chambers and valves are working. This procedure takes approximately one hour. There are no restrictions for this procedure. ? ? ?You are scheduled for a Cardiac Catheterization on Thursday, April 27 with Dr. Loralie Champagne. ? ?1. Please arrive at the Main Entrance A at [facility]: [place],  [place] at 11:30 AM (This time is two hours before your procedure to ensure your preparation). Free valet parking service is available.  ? ?Special note: Every effort is made to have your procedure done on time. Please understand that emergencies sometimes delay scheduled procedures. ? ?2. Diet: Do not eat solid foods after midnight.  You may have clear liquids until 5 AM upon the day of the procedure. ? ?3. Labs: morning of procedure ? ?4. Medication instructions in preparation for your procedure: ? ? Contrast Allergy: No ? ?Stop taking, Spironolatone  Thursday, April 27, ? ?Farxgia  Thursday, April 27, ? ?On the morning of your procedure, take Aspirin and Plavix  and any morning medicines NOT listed above.  You may use sips of water. ? ?5. Plan to go home the same day, you will only stay overnight if medically necessary. ?6. You MUST have a responsible adult to drive you home. ?7. An adult MUST be with you the first 24 hours after you arrive home. ?8. Bring a current list of your medications, and the last time and date medication taken. ?9. Bring ID and current insurance cards. ?10.Please wear clothes that are easy to get on and off and wear  slip-on shoes. ? ?Thank you for allowing [place] to care for you! ?  -- [facility] ? ? ?Referrals: ? ?none ? ?Special Instructions // Education: ? ?none ? ?Follow-Up in: 3 weeks  ? ?At the [facility], you and your health needs are our priority. We have a designated team specialized in the treatment of Heart Failure. This Care Team includes your primary Heart Failure Specialized Cardiologist (physician), Advanced Practice Providers (APPs- Physician Assistants and Nurse Practitioners), and Pharmacist who all work together to provide you with the care you need, when you need it.  ? ?You may see any of the following providers on your designated Care Team at your next follow up: ? ?Dr Glori Bickers ?Dr Loralie Champagne ?Darrick Grinder, NP ?Lyda Jester, PA ?Jessica Milford,NP ?Marlyce Huge, PA ?Audry Riles, PharmD ? ? ?Please be sure to bring in all your medications bottles to every appointment.  ? ?Need to Contact us: ? ?If you have any questions or concerns before your next appointment please send [place] a message through [place] or call our office at [phone].   ? ?TO LEAVE A MESSAGE FOR THE NURSE SELECT OPTION 2, PLEASE LEAVE A MESSAGE INCLUDING: ?YOUR NAME ?DATE OF BIRTH ?CALL BACK NUMBER ?REASON FOR CALL**this is important as we prioritize the call backs ? ?YOU WILL RECEIVE A CALL BACK THE SAME DAY AS LONG AS YOU CALL BEFORE 4:00 PM ? ? ?

## 2022-03-11 NOTE — H&P (View-Only) (Signed)
PCP: Dr. Coletta Memos ?Cardiology: Dr. [name] ?HF Cardiology: Dr. Aundra Dubin ? ?66 y.o. with history of CAD s/p anterior MI in 11/16 and ischemic cardiomyopathy presents for CHF clinic evaluation.  Patient was in good health until 11/16.  At that time, he had anterior STEMI treated with DES to proximal LAD.  Echo at the time showed EF 30-35%.  Since then, he has had 2 further echoes, both with EF 35-40%, most recently in 9/17.   ? ?He has severe OA R knee and is now s/p R TKR with Dr. Ronnie Derby with no complication.  ? ?He noted atypical chest pain in 5/18.  Cardiolite showed infarction without significant ischemia.  Cardiac MRI in 5/18 showed EF 44% with peri-apical scar. Echo in 5/19 showed EF 35-40% with wall motion abnormalities. CPX in 1/19 showed very mild HF limitation.   ? ?Echo in 7/20 showed EF 40-45% with peri-apical akinesis.  Cardiolite in 2/21 showed EF 51%, no ischemia, prior anteroseptal MI.  Echo in 1/22 showed EF 45-50% with apical akinesis and normal RV.  ? ?Cardiolite in 4/23 showed EF 38%, old anteroseptal MI with no ischemia.   ? ?He presents today for followup of CAD and CHF.  He has not been doing well.  He has a number of symptoms and is very worried about his recent stress test.  He has left lateral chest pain with exertion, this has significantly increased over the last couple of months.  He noted this walking on the beach last weekend.  He notes it with moderate exertion like yardwork.  He also has been getting significantly more short of breath with exertion.  He got very short of breath recently blowing leaves.  He additionally has had some GI issues with bloating and discomfort.  He had an abnormal Cologuard and needs colonoscopy.  He has been under a lot of stress recently.  ? ?ECG (personally reviewed): NSR, low voltage, old anterior MI.  ? ?Labs (2/17): LDL 46 ?Labs (11/17): K 3.9, creatinine 0.92, LFTs normal ?Labs (1/18): K 4.2, creatinine 0.92 ?Labs (5/18): K 4.5, creatinine 0.94, LDL 38, HDL  43 ?Labs (8/18): K 4.1, creatinine 0.91 ?Labs (7/19): LDL 42, K 4.1, creatinine 0.97 ?Labs (7/20): K 4, creatinine 0.9, LDL 42, hgb 14.1  ?Labs (1/21): K 4.7, creatinine 0.87, hgb 15, LDL 48 ?Labs (9/21): K 4, creatinine 0.89 ?Labs (1/22): LDL 50, HDL 49 ?Labs (2/22): K 4.2, creatinine 1.05 ?Labs (10/22): K 3.8, creatinine 0.88 ? ?PMH: ?1. Hyperlipidemia ?2. OA: right knee. Right TKR 11/17.  ?3. CAD: Anterior STEMI 11/16.  Totally occluded LAD on cath => DES to proximal LAD.   ?- Cardiolite (5/18): EF 35%, large LAD territory infarction with no ischemia.  ?- Cardiolite (2/21): EF 515, no ischemia, prior anteroseptal MI.  ?4. Chronic systolic CHF: Ischemic cardiomyopathy. ?- 11/16 echo: EF 30-35%  ?- 2/17 echo: EF 35-40% with regional WMAs.  ?- 9/17 echo: EF 35-40%, anteroseptal/apical akinesis. ?- CPX (2/18): peak VO2 19.6, VE/VCO2 slope 30, RER 1.34.  Mild to moderate HF limitation.  ?- Cardiac MRI (5/18): EF 44%, mid anteroseptal, mid anterior, and peri-apical scar/akinesis, LGE pattern not suggestive of viability.  Mild RV dilation with normal systolic function.  ?- Echo (5/19): EF 35-40%, septal apical and anterior hypokinesis ?- CPX (1/20): Very mild HF limitation.  VO2 max 20.6, VE/VCO2 slope 31, RER 1.35, PFTs normal.  ?- Echo (7/20): EF 40-45%, peri-apical akinesis, normal RV.  ?- Echo (1/22): EF 45-50% with apical akinesis and normal RV.  ?-  Cardiolite (4/23): EF 38%, old anteroseptal MI with no ischemia. ?5. COVID-19 10/21 ? 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  ? ?FH: No premature CAD ? ?ROS: All systems reviewed and negative except as per HPI.  ? ?Current Outpatient Medications  ?Medication Sig Dispense Refill  ? acetaminophen (TYLENOL) 500 MG tablet Take 1,000 mg by mouth every 6 (six) hours as needed for mild pain.    ? aspirin 81 MG chewable tablet Chew 1 tablet (81 mg total) by mouth daily.    ? atorvastatin (LIPITOR) 40 MG tablet TAKE 1  TABLET (40 MG TOTAL) BY MOUTH DAILY AT 6 PM. PLEASE SCHEDULE APPOINTMENT FOR FUTURE REFILLS 90 tablet 0  ? carvedilol (COREG) 3.125 MG tablet TAKE 1 TABLET BY MOUTH TWICE A DAY 60 tablet 5  ? clopidogrel (PLAVIX) 75 MG tablet TAKE 1 TABLET BY MOUTH EVERY DAY 30 tablet 11  ? Coenzyme Q10 (COQ10 PO) Take 1 tablet by mouth daily.    ? doxycycline (VIBRAMYCIN) 50 MG capsule Take 50 mg by mouth 2 (two) times daily as needed (for sinus pressure).   3  ? FARXIGA 10 MG TABS tablet TAKE 1 TABLET BY MOUTH DAILY BEFORE BREAKFAST. 90 tablet 3  ? gabapentin (NEURONTIN) 300 MG capsule TAKE 1 CAPSULE AT MIDDAY AND 3 CAPSULES AT BEDTIME 120 capsule 0  ? Multiple Vitamin (MULTIVITAMIN) tablet Take 1 tablet by mouth daily.    ? nitroGLYCERIN (NITROSTAT) 0.4 MG SL tablet PLACE 1 TABLET (0.4 MG TOTAL) UNDER THE TONGUE EVERY 5 (FIVE) MINUTES X 3 DOSES AS NEEDED FOR CHEST 25 tablet 6  ? oxymetazoline (AFRIN) 0.05 % nasal spray Place 2 sprays into both nostrils as needed for congestion.    ? pantoprazole (PROTONIX) 40 MG tablet Take 1 tablet (40 mg total) by mouth daily. 30 tablet 1  ? ranitidine (ZANTAC 75) 75 MG tablet Take 1 tablet (75 mg total) by mouth as needed for heartburn.    ? sodium chloride (MURO 128) 5 % ophthalmic ointment Place 1 application into both eyes at bedtime.    ? spironolactone (ALDACTONE) 25 MG tablet TAKE 1 TABLET BY MOUTH EVERY DAY 90 tablet 3  ? zolpidem (AMBIEN) 10 MG tablet Take 10 mg by mouth at bedtime.    ? lisinopril (ZESTRIL) 5 MG tablet Take 1 tablet (5 mg total) by mouth daily. 90 tablet 3  ? Na Sulfate-K Sulfate-Mg Sulf (SUPREP BOWEL PREP KIT) 17.5-3.13-1.6 GM/177ML SOLN Take 1 kit by mouth as directed. For colonoscopy prep (Patient not taking: Reported on 03/11/2022) 354 mL 0  ? ?No current facility-administered medications for this encounter.  ? ?BP 104/70   Pulse (!) 54   Wt 64.6 kg (142 lb 6.4 oz)   SpO2 100%   BMI 21.03 kg/m?   ? ?Wt Readings from Last 3 Encounters:  ?03/11/22 64.6 kg (142 lb  6.4 oz)  ?02/24/22 66.2 kg (146 lb)  ?01/21/22 65.9 kg (145 lb 6 oz)  ? ?General: NAD ?Neck: No JVD, no thyromegaly or thyroid nodule.  ?Lungs: Clear to auscultation bilaterally with normal respiratory effort. ?CV: Nondisplaced PMI.  Heart regular S1/S2, no S3/S4, no murmur.  No peripheral edema.  No carotid bruit.  Normal pedal pulses.  ?Abdomen: Soft, nontender, no hepatosplenomegaly, no distention.  ?Skin: Intact without lesions or rashes.  ?Neurologic: Alert and oriented x 3.  ?Psych: Normal affect. ?Extremities: No clubbing or cyanosis.  ?HEENT: Normal.  ? ?Assessment/Plan: ?1. CAD:  s/p anterior MI 11/16 with DES to proximal LAD.  Cardiolite in 5/18, 2/21, and again in 4/23 with old anterior MI, no ischemia. However, patient continues to have exertional lateral chest pain as well as dyspnea.  The EF by 4/23 Cardiolite was lower than in the past.  Symptoms are significantly worse than in the past and are worrying patient.  He says that he is now significantly limited.   ?- He will continue ASA 81 and atorvastatin 40 daily.  ?- Plavix 75 mg daily which I would have him continue long-term as long as he does not have bleeding issues.   ?- Given ongoing and worsening exertional symptoms, I am going to arrange for LHC/RHC to assess coronaries as well as filling pressures and cardiac output. I discussed risks/benefits with the patient and he agrees to procedure.  ?2. Chronic systolic CHF:  Echo 8/75 with EF 35-40%.  He has been in this range since his MI.  Ischemic cardiomyopathy.  Prior CPX with mild to moderate limitation due to HF. Cardiac MRI with peri-apical scar and EF 44%. Echo in 5/19 with EF 35-40%.  CPX showed minimal HF limitation, normal PFTs.  Echo in 7/20 showed EF 40-45% and echo in 1/22 showed EF 45-50% with apical akinesis.  Cardiolite in 4/23 showed lower EF, 38%.  Worsened NYHA class III symptoms, but patient is not volume overloaded on exam. Medication titration has been limited by soft blood  pressure.  ?- Increase lisinopril to 5 mg daily. BMET today and in 10 days.  ?- Continue spironolactone 25 mg daily.    ?- Continue Coreg 3.125 mg bid.     ?- EF has been out of range for ICD.  He does need a r

## 2022-03-13 ENCOUNTER — Telehealth (HOSPITAL_COMMUNITY): Payer: Self-pay | Admitting: *Deleted

## 2022-03-13 NOTE — Telephone Encounter (Signed)
R/l heart cath auth faxed to [employer] [phone] ?

## 2022-03-19 ENCOUNTER — Ambulatory Visit (HOSPITAL_COMMUNITY)
Admission: RE | Admit: 2022-03-19 | Discharge: 2022-03-19 | Disposition: A | Discharge status: Home / Self Care | Payer: Managed Care, Other (non HMO) | Attending: Cardiology | Admitting: Cardiology

## 2022-03-19 ENCOUNTER — Other Ambulatory Visit: Payer: Self-pay

## 2022-03-19 ENCOUNTER — Ambulatory Visit (HOSPITAL_COMMUNITY)
Admission: RE | Disposition: A | Discharge status: Home / Self Care | Payer: Self-pay | Source: Home / Self Care | Attending: Cardiology

## 2022-03-19 DIAGNOSIS — R079 Chest pain, unspecified: Secondary | ICD-10-CM | POA: Insufficient documentation

## 2022-03-19 DIAGNOSIS — Z7902 Long term (current) use of antithrombotics/antiplatelets: Secondary | ICD-10-CM | POA: Diagnosis not present

## 2022-03-19 DIAGNOSIS — I255 Ischemic cardiomyopathy: Secondary | ICD-10-CM | POA: Insufficient documentation

## 2022-03-19 DIAGNOSIS — I252 Old myocardial infarction: Secondary | ICD-10-CM | POA: Insufficient documentation

## 2022-03-19 DIAGNOSIS — I5022 Chronic systolic (congestive) heart failure: Secondary | ICD-10-CM | POA: Diagnosis not present

## 2022-03-19 DIAGNOSIS — Z955 Presence of coronary angioplasty implant and graft: Secondary | ICD-10-CM | POA: Insufficient documentation

## 2022-03-19 DIAGNOSIS — E785 Hyperlipidemia, unspecified: Secondary | ICD-10-CM | POA: Insufficient documentation

## 2022-03-19 DIAGNOSIS — Z87891 Personal history of nicotine dependence: Secondary | ICD-10-CM | POA: Insufficient documentation

## 2022-03-19 DIAGNOSIS — Z79899 Other long term (current) drug therapy: Secondary | ICD-10-CM | POA: Insufficient documentation

## 2022-03-19 DIAGNOSIS — I2510 Atherosclerotic heart disease of native coronary artery without angina pectoris: Secondary | ICD-10-CM | POA: Insufficient documentation

## 2022-03-19 HISTORY — PX: RIGHT/LEFT HEART CATH AND CORONARY ANGIOGRAPHY: CATH118266

## 2022-03-19 LAB — POCT I-STAT EG7
Acid-Base Excess: 0 mmol/L (ref 0.0–2.0)
Acid-base deficit: 1 mmol/L (ref 0.0–2.0)
Bicarbonate: 25.2 mmol/L (ref 20.0–28.0)
Bicarbonate: 25.9 mmol/L (ref 20.0–28.0)
Calcium, Ion: 1.14 mmol/L — ABNORMAL LOW (ref 1.15–1.40)
Calcium, Ion: 1.25 mmol/L (ref 1.15–1.40)
HCT: 39 % (ref 39.0–52.0)
HCT: 41 % (ref 39.0–52.0)
Hemoglobin: 13.3 g/dL (ref 13.0–17.0)
Hemoglobin: 13.9 g/dL (ref 13.0–17.0)
O2 Saturation: 78 %
O2 Saturation: 79 %
Potassium: 3.8 mmol/L (ref 3.5–5.1)
Potassium: 4.1 mmol/L (ref 3.5–5.1)
Sodium: 138 mmol/L (ref 135–145)
Sodium: 139 mmol/L (ref 135–145)
TCO2: 27 mmol/L (ref 22–32)
TCO2: 27 mmol/L (ref 22–32)
pCO2, Ven: 44.6 mmHg (ref 44–60)
pCO2, Ven: 46.8 mmHg (ref 44–60)
pH, Ven: 7.351 (ref 7.25–7.43)
pH, Ven: 7.36 (ref 7.25–7.43)
pO2, Ven: 45 mmHg (ref 32–45)
pO2, Ven: 45 mmHg (ref 32–45)

## 2022-03-19 LAB — BASIC METABOLIC PANEL
Anion gap: 6 (ref 5–15)
BUN: 16 mg/dL (ref 8–23)
CO2: 27 mmol/L (ref 22–32)
Calcium: 9.1 mg/dL (ref 8.9–10.3)
Chloride: 106 mmol/L (ref 98–111)
Creatinine, Ser: 0.76 mg/dL (ref 0.61–1.24)
GFR, Estimated: >60 mL/min (ref >60)
Glucose, Bld: 104 mg/dL — ABNORMAL HIGH (ref 70–99)
Potassium: 4.5 mmol/L (ref 3.5–5.1)
Sodium: 139 mmol/L (ref 135–145)

## 2022-03-19 SURGERY — RIGHT/LEFT HEART CATH AND CORONARY ANGIOGRAPHY
Anesthesia: LOCAL

## 2022-03-19 MED ORDER — HEPARIN (PORCINE) IN NACL 1000-0.9 UT/500ML-% IV SOLN
INTRAVENOUS | Status: AC
  Filled 2022-03-19: qty 1000

## 2022-03-19 MED ORDER — LABETALOL HCL 5 MG/ML IV SOLN: 10.0000 mg | INTRAVENOUS | Status: DC | PRN

## 2022-03-19 MED ORDER — SODIUM CHLORIDE 0.9% FLUSH: 3.0000 mL | Freq: Two times a day (BID) | INTRAVENOUS | Status: DC

## 2022-03-19 MED ORDER — MIDAZOLAM HCL 2 MG/2ML IJ SOLN
INTRAMUSCULAR | Status: AC
  Filled 2022-03-19: qty 2

## 2022-03-19 MED ORDER — HEPARIN SODIUM (PORCINE) 1000 UNIT/ML IJ SOLN
INTRAMUSCULAR | Status: AC
  Filled 2022-03-19: qty 10

## 2022-03-19 MED ORDER — FENTANYL CITRATE (PF) 100 MCG/2ML IJ SOLN
INTRAMUSCULAR | Status: AC
  Filled 2022-03-19: qty 2

## 2022-03-19 MED ORDER — ACETAMINOPHEN 325 MG PO TABS: 650.0000 mg | ORAL_TABLET | ORAL | Status: DC | PRN

## 2022-03-19 MED ORDER — SODIUM CHLORIDE 0.9% FLUSH: 3.0000 mL | INTRAVENOUS | Status: DC | PRN

## 2022-03-19 MED ORDER — SODIUM CHLORIDE 0.9 % IV SOLN: 250.0000 mL | INTRAVENOUS | Status: DC | PRN

## 2022-03-19 MED ORDER — HEPARIN (PORCINE) IN NACL 1000-0.9 UT/500ML-% IV SOLN
INTRAVENOUS | Status: DC | PRN
  Administered 2022-03-19 (×2): 500 mL

## 2022-03-19 MED ORDER — FENTANYL CITRATE (PF) 100 MCG/2ML IJ SOLN
INTRAMUSCULAR | Status: DC | PRN
  Administered 2022-03-19: 25 ug via INTRAVENOUS

## 2022-03-19 MED ORDER — VERAPAMIL HCL 2.5 MG/ML IV SOLN
INTRAVENOUS | Status: AC
  Filled 2022-03-19: qty 2

## 2022-03-19 MED ORDER — VERAPAMIL HCL 2.5 MG/ML IV SOLN
INTRAVENOUS | Status: DC | PRN
  Administered 2022-03-19: 10 mL via INTRA_ARTERIAL

## 2022-03-19 MED ORDER — MIDAZOLAM HCL 2 MG/2ML IJ SOLN
INTRAMUSCULAR | Status: DC | PRN
Start: 2022-03-19 — End: 2022-03-19
  Administered 2022-03-19: 1 mg via INTRAVENOUS

## 2022-03-19 MED ORDER — SODIUM CHLORIDE 0.9 % IV SOLN: INTRAVENOUS | Status: DC

## 2022-03-19 MED ORDER — LIDOCAINE HCL (PF) 1 % IJ SOLN
INTRAMUSCULAR | Status: AC
  Filled 2022-03-19: qty 30

## 2022-03-19 MED ORDER — LIDOCAINE HCL (PF) 1 % IJ SOLN
INTRAMUSCULAR | Status: DC | PRN
Start: 2022-03-19 — End: 2022-03-19
  Administered 2022-03-19 (×2): 2 mL via INTRADERMAL

## 2022-03-19 MED ORDER — IOHEXOL 350 MG/ML SOLN
INTRAVENOUS | Status: DC | PRN
Start: 2022-03-19 — End: 2022-03-19
  Administered 2022-03-19: 30 mL

## 2022-03-19 MED ORDER — ONDANSETRON HCL 4 MG/2ML IJ SOLN: 4.0000 mg | Freq: Four times a day (QID) | INTRAMUSCULAR | Status: DC | PRN

## 2022-03-19 MED ORDER — SODIUM CHLORIDE 0.9 % IV SOLN
INTRAVENOUS | Status: DC | PRN
  Administered 2022-03-19: 75 mL/h via INTRAVENOUS
  Administered 2022-03-19: 250 mL

## 2022-03-19 MED ORDER — HYDRALAZINE HCL 20 MG/ML IJ SOLN: 10.0000 mg | INTRAMUSCULAR | Status: DC | PRN

## 2022-03-19 SURGICAL SUPPLY — 11 items
CATH 5FR JL3.5 JR4 ANG PIG MP (CATHETERS) ×1 IMPLANT
CATH BALLN WEDGE 5F 110CM (CATHETERS) ×1 IMPLANT
DEVICE RAD COMP TR BAND LRG (VASCULAR PRODUCTS) ×1 IMPLANT
GLIDESHEATH SLEND SS 6F .021 (SHEATH) ×1 IMPLANT
GUIDEWIRE INQWIRE 1.5J.035X260 (WIRE) IMPLANT
INQWIRE 1.5J .035X260CM (WIRE) ×2
KIT HEART LEFT (KITS) ×3 IMPLANT
PACK CARDIAC CATHETERIZATION (CUSTOM PROCEDURE TRAY) ×3 IMPLANT
SHEATH GLIDE SLENDER 4/5FR (SHEATH) ×1 IMPLANT
SHEATH PROBE COVER 6X72 (BAG) ×1 IMPLANT
TRANSDUCER W/STOPCOCK (MISCELLANEOUS) ×3 IMPLANT

## 2022-03-19 NOTE — Progress Notes (Signed)
Patients blood pressures have been low since arriving to short stay. Patient is asymptomatic. Aundra Dubin, MD made aware and stated that this is where patient normally is and that he is okay to be discharged.  ?

## 2022-03-19 NOTE — Interval H&P Note (Signed)
History and Physical Interval Note: ? ?03/19/2022 ?1:14 PM ? ?AHARON CARRIERE  has presented today for surgery, with the diagnosis of CHF.  The various methods of treatment have been discussed with the patient and family. After consideration of risks, benefits and other options for treatment, the patient has consented to  Procedure(s): ?RIGHT/LEFT HEART CATH AND CORONARY ANGIOGRAPHY (N/A) as a surgical intervention.  The patient's history has been reviewed, patient examined, no change in status, stable for surgery.  I have reviewed the patient's chart and labs.  Questions were answered to the patient's satisfaction.   ? ? ?Capone Schwinn Aundra Dubin ? ? ?

## 2022-03-20 ENCOUNTER — Encounter (HOSPITAL_COMMUNITY): Payer: Self-pay | Admitting: Cardiology

## 2022-03-20 MED FILL — Heparin Sodium (Porcine) Inj 1000 Unit/ML: INTRAMUSCULAR | Qty: 10 | Status: AC

## 2022-03-23 ENCOUNTER — Encounter: Payer: Self-pay | Admitting: Gastroenterology

## 2022-03-23 ENCOUNTER — Ambulatory Visit (AMBULATORY_SURGERY_CENTER): Payer: Managed Care, Other (non HMO) | Admitting: Gastroenterology

## 2022-03-23 ENCOUNTER — Telehealth: Payer: Self-pay

## 2022-03-23 VITALS — BP 109/77 | HR 56 | Temp 96.0°F | Resp 13 | Ht 69.0 in | Wt 145.0 lb

## 2022-03-23 DIAGNOSIS — Z1211 Encounter for screening for malignant neoplasm of colon: Secondary | ICD-10-CM

## 2022-03-23 DIAGNOSIS — R195 Other fecal abnormalities: Secondary | ICD-10-CM

## 2022-03-23 MED ORDER — SODIUM CHLORIDE 0.9 % IV SOLN: 500.0000 mL | Freq: Once | INTRAVENOUS | Status: DC

## 2022-03-23 NOTE — Patient Instructions (Addendum)
YOU HAD AN ENDOSCOPIC PROCEDURE TODAY AT [facility]:   Refer to the procedure report that was given to you for any specific questions about what was found during the examination.  If the procedure report does not answer your questions, please call your gastroenterologist to clarify.  If you requested that your care partner not be given the details of your procedure findings, then the procedure report has been included in a sealed envelope for you to review at your convenience later. ? ?You may resume your plavix today at the previous dose.  Read all of the handouts given to you by your recovery room nurse. ? ?YOU SHOULD EXPECT: Some feelings of bloating in the abdomen. Passage of more gas than usual.  Walking can help get rid of the air that was put into your GI tract during the procedure and reduce the bloating. If you had a lower endoscopy (such as a colonoscopy or flexible sigmoidoscopy) you may notice spotting of blood in your stool or on the toilet paper. If you underwent a bowel prep for your procedure, you may not have a normal bowel movement for a few days. ? ?Please Note:  You might notice some irritation and congestion in your nose or some drainage.  This is from the oxygen used during your procedure.  There is no need for concern and it should clear up in a day or so. ? ?SYMPTOMS TO REPORT IMMEDIATELY: ? ?Following lower endoscopy (colonoscopy or flexible sigmoidoscopy): ? Excessive amounts of blood in the stool ? Significant tenderness or worsening of abdominal pains ? Swelling of the abdomen that is new, acute ? Fever of 100?F or higher ? ? ?For urgent or emergent issues, a gastroenterologist can be reached at any hour by calling [phone]. ?Do not use MyChart messaging for urgent concerns.  ? ? ?DIET:  We do recommend a small meal at first, but then you may proceed to your regular diet.  Drink plenty of fluids but you should avoid alcoholic beverages for 24 hours. ? ?ACTIVITY:   You should plan to take it easy for the rest of today and you should NOT DRIVE or use heavy machinery until tomorrow (because of the sedation medicines used during the test).   ? ?FOLLOW UP: ?Our staff will call the number listed on your records 48-72 hours following your procedure to check on you and address any questions or concerns that you may have regarding the information given to you following your procedure. If we do not reach you, we will leave a message.  We will attempt to reach you two times.  During this call, we will ask if you have developed any symptoms of COVID 19. If you develop any symptoms (ie: fever, flu-like symptoms, shortness of breath, cough etc.) before then, please call [phone].  If you test positive for Covid 19 in the 2 weeks post procedure, please call and report this information to [place].   ? ? ? ?SIGNATURES/CONFIDENTIALITY: ?You and/or your care partner have signed paperwork which will be entered into your electronic medical record.  These signatures attest to the fact that that the information above on your After Visit Summary has been reviewed and is understood.  Full responsibility of the confidentiality of this discharge information lies with you and/or your care-partner.  ?

## 2022-03-23 NOTE — Progress Notes (Signed)
To PACU, VSS. Report to [email or web address] 

## 2022-03-23 NOTE — Op Note (Signed)
[facility] ?Patient Name: Joseph Riddle ?Procedure Date: 03/23/2022 9:48 AM ?MRN: [id number] ?Endoscopist: Milus Banister , MD ?Age: 66 ?Referring MD:  ?Date of Birth: 23-Jun-1956 ?Gender: Male ?Account #: [id number] ?Procedure:                Colonoscopy ?Indications:              High risk colon cancer surveillance: cologuard +  ?                          stool (while on plavix) and also a personal h/o  ?                          precancerous colon polyp on 2008 colonoscopy ?Medicines:                Monitored Anesthesia Care ?Procedure:                Pre-Anesthesia Assessment: ?                          - Prior to the procedure, a History and Physical  ?                          was performed, and patient medications and  ?                          allergies were reviewed. The patient's tolerance of  ?                          previous anesthesia was also reviewed. The risks  ?                          and benefits of the procedure and the sedation  ?                          options and risks were discussed with the patient.  ?                          All questions were answered, and informed consent  ?                          was obtained. Prior Anticoagulants: The patient has  ?                          taken Plavix (clopidogrel), last dose was 5 days  ?                          prior to procedure. ASA Grade Assessment: III - A  ?                          patient with severe systemic disease. After  ?                          reviewing the risks and benefits, the patient was  ?  deemed in satisfactory condition to undergo the  ?                          procedure. ?                          After obtaining informed consent, the colonoscope  ?                          was passed under direct vision. Throughout the  ?                          procedure, the patient's blood pressure, pulse, and  ?                          oxygen saturations were monitored continuously. The  ?                           Olympus PCF-H190DL (#[id number]) Colonoscope was  ?                          introduced through the anus and advanced to the the  ?                          cecum, identified by appendiceal orifice and  ?                          ileocecal valve. The colonoscopy was performed  ?                          without difficulty. The patient tolerated the  ?                          procedure well. The quality of the bowel  ?                          preparation was good. The ileocecal valve,  ?                          appendiceal orifice, and rectum were photographed. ?Scope In: 10:02:37 AM ?Scope Out: 10:12:28 AM ?Scope Withdrawal Time: 0 hours 6 minutes 36 seconds  ?Total Procedure Duration: 0 hours 9 minutes 51 seconds  ?Findings:                 A few small and large-mouthed diverticula were  ?                          found in the left colon. ?                          Internal hemorrhoids were found. The hemorrhoids  ?                          were small. ?  The exam was otherwise without abnormality on  ?                          direct and retroflexion views. ?Complications:            No immediate complications. Estimated blood loss:  ?                          None. ?Estimated Blood Loss:     Estimated blood loss: none. ?Impression:               - Diverticulosis in the left colon. ?                          - Internal hemorrhoids. ?                          - The examination was otherwise normal on direct  ?                          and retroflexion views. ?                          - No polyps or cancers. ?Recommendation:           - You do not need any further colon cancer  ?                          screening tests (including stool based colon cancer  ?                          screening tests). These types of tests generally  ?                          stop around age 44-80. ?                          - You can restart your plavix today. ?Milus Banister, MD ?03/23/2022  10:16:38 AM ?This report has been signed electronically. ?

## 2022-03-23 NOTE — Progress Notes (Signed)
HPI: ?This is a man with cologuard + stool, personal history of adenomatous colon polyp (colonoscopy 2008).  ? ? ?ROS: complete GI ROS as described in HPI, all other review negative. ? ?Constitutional:  No unintentional weight loss ? ? ?Past Medical History:  ?Diagnosis Date  ? Arthritis   ? Cholelithiasis   ? Clotting disorder ([place])   ? Coronary artery disease   ? Dyslipidemia 09/2015  ? Dyspnea   ? Hyperlipidemia   ? Hypertension   ? Ischemic cardiomyopathy 09/2015  ? EF 30-35 percent by echo 10/07/2015  ? STEMI (ST elevation myocardial infarction) ([place]) 10/05/2015  ? DES LAD, residual diagonal 90%, medical Rx  ? Tubular adenoma of colon 2008  ? ? ?Past Surgical History:  ?Procedure Laterality Date  ? CARDIAC CATHETERIZATION N/A 10/05/2015  ? Procedure: Left Heart Cath;  Surgeon: Peter M Martinique, MD;  Location: [facility];  Service: Cardiovascular;  Laterality: N/A;  ? CARDIAC CATHETERIZATION  10/05/2015  ? Procedure: Coronary Stent Intervention;  Surgeon: Peter M Martinique, MD;  Location: [facility];  Service: Cardiovascular;;  ? COLONOSCOPY    ? CORONARY STENT PLACEMENT    ? FOOT SURGERY Left 10/2013  ? Dr. [name]  ? MENISCUS REPAIR    ? RIGHT/LEFT HEART CATH AND CORONARY ANGIOGRAPHY N/A 03/19/2022  ? Procedure: RIGHT/LEFT HEART CATH AND CORONARY ANGIOGRAPHY;  Surgeon: Larey Dresser, MD;  Location: [facility];  Service: Cardiovascular;  Laterality: N/A;  ? TOTAL KNEE ARTHROPLASTY Right 10/12/2016  ? Procedure: RIGHT TOTAL KNEE ARTHROPLASTY;  Surgeon: Vickey Huger, MD;  Location: [facility];  Service: Orthopedics;  Laterality: Right;  ? ? ?Current Outpatient Medications  ?Medication Sig Dispense Refill  ? acetaminophen (TYLENOL) 500 MG tablet Take 1,000 mg by mouth every 6 (six) hours as needed for mild pain.    ? aspirin EC 81 MG tablet Take 81 mg by mouth daily. Swallow whole.    ? atorvastatin (LIPITOR) 40 MG tablet TAKE 1 TABLET (40 MG TOTAL) BY MOUTH DAILY AT 6 PM. PLEASE SCHEDULE  APPOINTMENT FOR FUTURE REFILLS 90 tablet 0  ? carvedilol (COREG) 3.125 MG tablet TAKE 1 TABLET BY MOUTH TWICE A DAY 60 tablet 5  ? gabapentin (NEURONTIN) 300 MG capsule TAKE 1 CAPSULE AT MIDDAY AND 3 CAPSULES AT BEDTIME (Patient taking differently: Take 600-900 mg at night, may take a 300 mg midday as needed for pain) 120 capsule 0  ? lisinopril (ZESTRIL) 5 MG tablet Take 1 tablet (5 mg total) by mouth daily. 90 tablet 3  ? oxymetazoline (AFRIN) 0.05 % nasal spray Place 2 sprays into both nostrils as needed for congestion.    ? pantoprazole (PROTONIX) 40 MG tablet Take 1 tablet (40 mg total) by mouth daily. 30 tablet 1  ? sodium chloride (MURO 128) 5 % ophthalmic ointment Place 1 application into both eyes at bedtime.    ? spironolactone (ALDACTONE) 25 MG tablet TAKE 1 TABLET BY MOUTH EVERY DAY 90 tablet 3  ? zolpidem (AMBIEN) 10 MG tablet Take 10 mg by mouth at bedtime.    ? clopidogrel (PLAVIX) 75 MG tablet TAKE 1 TABLET BY MOUTH EVERY DAY 30 tablet 11  ? FARXIGA 10 MG TABS tablet TAKE 1 TABLET BY MOUTH DAILY BEFORE BREAKFAST. 90 tablet 3  ? meloxicam (MOBIC) 7.5 MG tablet Take 7.5 mg by mouth daily.    ? nitroGLYCERIN (NITROSTAT) 0.4 MG SL tablet PLACE 1 TABLET (0.4 MG TOTAL) UNDER THE TONGUE EVERY 5 (FIVE)  MINUTES X 3 DOSES AS NEEDED FOR CHEST 25 tablet 6  ? ?Current Facility-Administered Medications  ?Medication Dose Route Frequency Provider Last Rate Last Admin  ? 0.9 %  sodium chloride infusion  500 mL Intravenous Once Milus Banister, MD      ? ? ?Allergies as of 03/23/2022  ? (No Known Allergies)  ? ? ?Family History  ?Problem Relation Age of Onset  ? Rheumatic fever Mother   ? Heart disease Father   ? Heart disease Maternal Grandfather   ? Colon cancer Neg Hx   ? Stomach cancer Neg Hx   ? Rectal cancer Neg Hx   ? ? ?Social History  ? ?Socioeconomic History  ? Marital status: Married  ?  Spouse name: Not on file  ? Number of children: 2  ? Years of education: Not on file  ? Highest education level: Not on  file  ?Occupational History  ? Occupation: Engineer, maintenance (IT)  ?Tobacco Use  ? Smoking status: Former  ?  Years: 15.00  ?  Types: Cigarettes  ?  Quit date: 07/24/2000  ?  Years since quitting: 21.6  ? Smokeless tobacco: Never  ?Vaping Use  ? Vaping Use: Never used  ?Substance and Sexual Activity  ? Alcohol use: Yes  ?  Alcohol/week: 0.0 standard drinks  ?  Comment: very occasionally - mixed drinks  ? Drug use: No  ? Sexual activity: Never  ?Other Topics Concern  ? Not on file  ?Social History Narrative  ? Married. Education: [employer]. Pt does not exercise.  ? ?Social Determinants of Health  ? ?Financial Resource Strain: Low Risk   ? Difficulty of Paying Living Expenses: Not hard at all  ?Food Insecurity: No Food Insecurity  ? Worried About Charity fundraiser in the Last Year: Never true  ? Ran Out of Food in the Last Year: Never true  ?Transportation Needs: No Transportation Needs  ? Lack of Transportation (Medical): No  ? Lack of Transportation (Non-Medical): No  ?Physical Activity: Not on file  ?Stress: Not on file  ?Social Connections: Not on file  ?Intimate Partner Violence: Not on file  ? ? ? ?Physical Exam: ?BP 109/63   Pulse 62   Temp (!) 96 ?F (35.6 ?C)   Ht '5\' 9"'$  (1.753 m)   Wt 145 lb (65.8 kg)   SpO2 100%   BMI 21.41 kg/m?  ?Constitutional: generally well-appearing ?Psychiatric: alert and oriented x3 ?Lungs: CTA bilaterally ?Heart: no MCR ? ?Assessment and plan: ?66 y.o. male with cologuard + stool (while on plavix), personal h/o precancerous colon polyp on 2008 colonoscopy ? ?For colonoscopy today ? ?Cologuard was NOT an appropriate test for him. ? ?His cardiologist has done recent testing; tells me that his LVEF is 38% and documented that he has preserved cardiac output on recent right/left heart cath. Patient tells me that his cardiologist 'cleared' him for the colonoscopy. ? ?Care is appropriate for the ambulatory setting. ? ?Owens Loffler, MD ?[facility] ?03/23/2022, 9:48 AM ? ? ? ?

## 2022-03-23 NOTE — Telephone Encounter (Signed)
Called pt per Dr. Ardis Hughs to inform him that his colonoscopy for today may be cancelled due to his recent heart catheterization and SOB.  Pt was upset stating that the procedure showed that there is nothing wrong with his heart and that his cardiologist knew he was having the colonoscopy today and cleared him for the procedure.   Told pt that I would inform Dr. Ardis Hughs and someone would call him back regarding his colonoscopy.   ? ? ?

## 2022-03-25 ENCOUNTER — Telehealth: Payer: Self-pay | Admitting: *Deleted

## 2022-03-25 NOTE — Telephone Encounter (Signed)
?  Follow up Call- ? ? ?  03/23/2022  ?  9:36 AM  ?Call back number  ?Post procedure Call Back phone  # [phone]  ?Permission to leave phone message Yes  ?  ? ?Patient questions: ? ?Do you have a fever, pain , or abdominal swelling? No. ?Pain Score  0 * ? ?Have you tolerated food without any problems? Yes.   ? ?Have you been able to return to your normal activities? Yes.   ? ?Do you have any questions about your discharge instructions: ?Diet   No. ?Medications  No. ?Follow up visit  No. ? ?Do you have questions or concerns about your Care? No. ? ?Actions: ?* If pain score is 4 or above: ?No action needed, pain <4. ? ? ?

## 2022-04-06 NOTE — Progress Notes (Signed)
PCP: Dr. Coletta Memos Cardiology: Dr. [name] HF Cardiology: Dr. Aundra Dubin  66 y.o. with history of CAD s/p anterior MI in 11/16 and ischemic cardiomyopathy presents for CHF clinic evaluation.  Patient was in good health until 11/16.  At that time, he had anterior STEMI treated with DES to proximal LAD.  Echo at the time showed EF 30-35%.  Since then, he has had 2 further echoes, both with EF 35-40%, most recently in 9/17.    He has severe OA R knee and is now s/p R TKR with Dr. Ronnie Derby with no complication.   He noted atypical chest pain in 5/18.  Cardiolite showed infarction without significant ischemia.  Cardiac MRI in 5/18 showed EF 44% with peri-apical scar. Echo in 5/19 showed EF 35-40% with wall motion abnormalities. CPX in 1/19 showed very mild HF limitation.    Echo in 7/20 showed EF 40-45% with peri-apical akinesis.  Cardiolite in 2/21 showed EF 51%, no ischemia, prior anteroseptal MI.  Echo in 1/22 showed EF 45-50% with apical akinesis and normal RV.   Cardiolite in 4/23 showed EF 38%, old anteroseptal MI with no ischemia.    He continued with worsening exertional symptoms at follow up 4/23, and R/LHC arranged, showing  no obstructive CAD, low filling pressures with preserved CO. No cardiac explanation for symptoms.  Today he returns for post-cath follow up. Overall feeling fine, remains fatigued after 5-10 minutes of physical activity. Continues with left-sided chest pain with physical exertion. Denies dizziness, edema, or PND/Orthopnea. Appetite ok. No fever or chills. Weight at home 145 pounds. Taking all medications. Works Biochemist, clinical, Network engineer job.   Echo today, results pending.  ECG (personally reviewed): NSR, 64 bpm  Labs (2/17): LDL 46 Labs (11/17): K 3.9, creatinine 0.92, LFTs normal Labs (1/18): K 4.2, creatinine 0.92 Labs (5/18): K 4.5, creatinine 0.94, LDL 38, HDL 43 Labs (8/18): K 4.1, creatinine 0.91 Labs (7/19): LDL 42, K 4.1, creatinine 0.97 Labs (7/20): K 4, creatinine 0.9, LDL  42, hgb 14.1  Labs (1/21): K 4.7, creatinine 0.87, hgb 15, LDL 48 Labs (9/21): K 4, creatinine 0.89 Labs (1/22): LDL 50, HDL 49 Labs (2/22): K 4.2, creatinine 1.05 Labs (10/22): K 3.8, creatinine 0.88 Labs (4/23): K 4.5, creatinine 0.76, hgb 15.3, HDL 43, LDL 50  PMH: 1. Hyperlipidemia 2. OA: right knee. Right TKR 11/17.  3. CAD: Anterior STEMI 11/16.  Totally occluded LAD on cath => DES to proximal LAD.   - Cardiolite (5/18): EF 35%, large LAD territory infarction with no ischemia.  - Cardiolite (2/21): EF 515, no ischemia, prior anteroseptal MI.  - R/LHC (4/23): non-obstructive CAD, 40% stenoses ramus lesion, pLAD to mLAD lesion 15% stenosed, 1st diag lesion 50% stenosed, pRCA to mRCA lesion 15% stenosed. 4. Chronic systolic CHF: Ischemic cardiomyopathy. - 11/16 echo: EF 30-35%  - 2/17 echo: EF 35-40% with regional WMAs.  - 9/17 echo: EF 35-40%, anteroseptal/apical akinesis. - CPX (2/18): peak VO2 19.6, VE/VCO2 slope 30, RER 1.34.  Mild to moderate HF limitation.  - Cardiac MRI (5/18): EF 44%, mid anteroseptal, mid anterior, and peri-apical scar/akinesis, LGE pattern not suggestive of viability.  Mild RV dilation with normal systolic function.  - Echo (5/19): EF 35-40%, septal apical and anterior hypokinesis - CPX (1/20): Very mild HF limitation.  VO2 max 20.6, VE/VCO2 slope 31, RER 1.35, PFTs normal.  - Echo (7/20): EF 40-45%, peri-apical akinesis, normal RV.  - Echo (1/22): EF 45-50% with apical akinesis and normal RV.  - Cardiolite (4/23): EF 38%,  old anteroseptal MI with no ischemia. - R/LHC (4/23): no obstructive CAD, normal filling pressures and normal CO. 5. COVID-19 10/21  SH: Lives in [place], works as Optometrist, married, quit smoking > 15 years ago, occasional ETOH.   FH: No premature CAD  ROS: All systems reviewed and negative except as per HPI.   Current Outpatient Medications  Medication Sig Dispense Refill   acetaminophen (TYLENOL) 500 MG tablet Take 1,000 mg by  mouth every 6 (six) hours as needed for mild pain.     aspirin EC 81 MG tablet Take 81 mg by mouth daily. Swallow whole.     atorvastatin (LIPITOR) 40 MG tablet TAKE 1 TABLET (40 MG TOTAL) BY MOUTH DAILY AT 6 PM. PLEASE SCHEDULE APPOINTMENT FOR FUTURE REFILLS 90 tablet 0   carvedilol (COREG) 3.125 MG tablet TAKE 1 TABLET BY MOUTH TWICE A DAY 60 tablet 5   clopidogrel (PLAVIX) 75 MG tablet TAKE 1 TABLET BY MOUTH EVERY DAY 30 tablet 11   FARXIGA 10 MG TABS tablet TAKE 1 TABLET BY MOUTH DAILY BEFORE BREAKFAST. 90 tablet 3   gabapentin (NEURONTIN) 300 MG capsule TAKE 1 CAPSULE AT MIDDAY AND 3 CAPSULES AT BEDTIME (Patient taking differently: Take 600-900 mg at night, may take a 300 mg midday as needed for pain) 120 capsule 0   lisinopril (ZESTRIL) 5 MG tablet Take 1 tablet (5 mg total) by mouth daily. 90 tablet 3   meloxicam (MOBIC) 7.5 MG tablet Take 7.5 mg by mouth daily.     nitroGLYCERIN (NITROSTAT) 0.4 MG SL tablet PLACE 1 TABLET (0.4 MG TOTAL) UNDER THE TONGUE EVERY 5 (FIVE) MINUTES X 3 DOSES AS NEEDED FOR CHEST 25 tablet 6   oxymetazoline (AFRIN) 0.05 % nasal spray Place 2 sprays into both nostrils as needed for congestion.     pantoprazole (PROTONIX) 40 MG tablet Take 1 tablet (40 mg total) by mouth daily. 30 tablet 1   sodium chloride (MURO 128) 5 % ophthalmic ointment Place 1 application into both eyes at bedtime.     spironolactone (ALDACTONE) 25 MG tablet TAKE 1 TABLET BY MOUTH EVERY DAY 90 tablet 3   zolpidem (AMBIEN) 10 MG tablet Take 10 mg by mouth at bedtime.     No current facility-administered medications for this encounter.   BP 92/64   Pulse 60   Ht '5\' 9"'$  (1.753 m)   Wt 66 kg (145 lb 8 oz)   SpO2 100%   BMI 21.49 kg/m    Wt Readings from Last 3 Encounters:  04/10/22 66 kg (145 lb 8 oz)  03/23/22 65.8 kg (145 lb)  03/19/22 64.9 kg (143 lb)   Physical Exam: General:  NAD. No resp difficulty HEENT: Normal Neck: Supple. No JVD. Carotids 2+ bilat; no bruits. No  lymphadenopathy or thryomegaly appreciated. Cor: PMI nondisplaced. Regular rate & rhythm. No rubs, gallops or murmurs. Lungs: Clear Abdomen: Soft, nontender, nondistended. No hepatosplenomegaly. No bruits or masses. Good bowel sounds. Extremities: No cyanosis, clubbing, rash, edema Neuro: Alert & oriented x 3, cranial nerves grossly intact. Moves all 4 extremities w/o difficulty. Flat affect, appears depressed.  Assessment/Plan: 1. CAD: s/p anterior MI 11/16 with DES to proximal LAD.  Cardiolite in 5/18, 2/21, and again in 4/23 with old anterior MI, no ischemia. However, patient continues to have exertional lateral chest pain as well as dyspnea.  The EF by 4/23 Cardiolite was lower than in the past.  Coronary angiography 4/23 showed non-obstructive disease, no explanation for current symptoms. Continues with  left-sided atypical chest pain, stable from previous pattern. - He will continue ASA 81 and atorvastatin 40 daily.  - Plavix 75 mg daily which I would have him continue long-term as long as he does not have bleeding issues.   2. Chronic systolic CHF:  Echo 0/34 with EF 35-40%.  He has been in this range since his MI.  Ischemic cardiomyopathy.  Prior CPX with mild to moderate limitation due to HF. Cardiac MRI with peri-apical scar and EF 44%. Echo in 5/19 with EF 35-40%.  CPX showed minimal HF limitation, normal PFTs.  Echo in 7/20 showed EF 40-45% and echo in 1/22 showed EF 45-50% with apical akinesis.  Cardiolite in 4/23 showed lower EF, 38%.  [place] 4/23 showed normal filling pressures with preserved CO. NYHA class II-early III symptoms, he is not very active physically. Patient is not volume overloaded on exam. Medication titration has been limited by soft blood pressure.  - Continue lisinopril 5 mg daily. BMET today. (Discussed reduced to 2.5 mg today with soft BP, but patient says he BP is always in the 90s. He he drops further, would lower dose). - Continue spironolactone 25 mg daily.   Recent  labs ok, SCr 0.76, K 4.5 - Continue Coreg 3.125 mg bid.     - Continue Farxiga 10 mg daily.  - EF has been out of range for ICD.  Repeat echo today to confirm lower EF seen on Cardiolite.  - Consider repeating CPX if symptoms remain or worsen. 3. Hyperlipidemia: Good lipids 4/23. 4. Diverticulosis: by Colonoscopy (5/23), otherwise normal.  Follow up in 6 months with Dr. Aundra Dubin.  Maricela Bo [facility] FNP-BC 04/10/2022

## 2022-04-09 ENCOUNTER — Encounter (HOSPITAL_COMMUNITY): Payer: Managed Care, Other (non HMO)

## 2022-04-10 ENCOUNTER — Ambulatory Visit (HOSPITAL_BASED_OUTPATIENT_CLINIC_OR_DEPARTMENT_OTHER)
Admission: RE | Admit: 2022-04-10 | Discharge: 2022-04-10 | Disposition: A | Discharge status: Home / Self Care | Payer: Managed Care, Other (non HMO) | Source: Ambulatory Visit | Attending: *Deleted | Admitting: *Deleted

## 2022-04-10 ENCOUNTER — Ambulatory Visit (HOSPITAL_COMMUNITY)
Admission: RE | Admit: 2022-04-10 | Discharge: 2022-04-10 | Disposition: A | Discharge status: Home / Self Care | Payer: Managed Care, Other (non HMO) | Source: Ambulatory Visit | Attending: Cardiology | Admitting: Cardiology

## 2022-04-10 ENCOUNTER — Encounter (HOSPITAL_COMMUNITY): Payer: Self-pay

## 2022-04-10 VITALS — BP 92/64 | HR 60 | Ht 69.0 in | Wt 145.5 lb

## 2022-04-10 DIAGNOSIS — Z9861 Coronary angioplasty status: Secondary | ICD-10-CM

## 2022-04-10 DIAGNOSIS — K5790 Diverticulosis of intestine, part unspecified, without perforation or abscess without bleeding: Secondary | ICD-10-CM | POA: Diagnosis not present

## 2022-04-10 DIAGNOSIS — I255 Ischemic cardiomyopathy: Secondary | ICD-10-CM | POA: Diagnosis not present

## 2022-04-10 DIAGNOSIS — E785 Hyperlipidemia, unspecified: Secondary | ICD-10-CM | POA: Diagnosis not present

## 2022-04-10 DIAGNOSIS — Z79899 Other long term (current) drug therapy: Secondary | ICD-10-CM | POA: Insufficient documentation

## 2022-04-10 DIAGNOSIS — I5022 Chronic systolic (congestive) heart failure: Secondary | ICD-10-CM | POA: Diagnosis not present

## 2022-04-10 DIAGNOSIS — I252 Old myocardial infarction: Secondary | ICD-10-CM | POA: Diagnosis not present

## 2022-04-10 DIAGNOSIS — I2510 Atherosclerotic heart disease of native coronary artery without angina pectoris: Secondary | ICD-10-CM | POA: Diagnosis not present

## 2022-04-10 DIAGNOSIS — Z955 Presence of coronary angioplasty implant and graft: Secondary | ICD-10-CM | POA: Diagnosis not present

## 2022-04-10 DIAGNOSIS — Z7902 Long term (current) use of antithrombotics/antiplatelets: Secondary | ICD-10-CM | POA: Diagnosis not present

## 2022-04-10 LAB — ECHOCARDIOGRAM COMPLETE
Area-P 1/2: 3.4 cm2
Calc EF: 57.9 %
S' Lateral: 2.8 cm
Single Plane A2C EF: 57.8 %
Single Plane A4C EF: 56.8 %

## 2022-04-10 NOTE — Patient Instructions (Signed)
It was great to see you today! No medication changes are needed at this time.     Your physician wants you to follow-up in: 6 months with Dr Kendall Flack will receive a reminder letter in the mail two months in advance. If you don't receive a letter, please call our office to schedule the follow-up appointment.    Do the following things EVERYDAY: Weigh yourself in the morning before breakfast. Write it down and keep it in a log. Take your medicines as prescribed Eat low salt foods--Limit salt (sodium) to 2000 mg per day.  Stay as active as you can everyday Limit all fluids for the day to less than 2 liters  At the [facility], you and your health needs are our priority. As part of our continuing mission to provide you with exceptional heart care, we have created designated Provider Care Teams. These Care Teams include your primary Cardiologist (physician) and Advanced Practice Providers (APPs- Physician Assistants and Nurse Practitioners) who all work together to provide you with the care you need, when you need it.   You may see any of the following providers on your designated Care Team at your next follow up: Dr Glori Bickers Dr Haynes Kerns, NP Lyda Jester, [place] [facility] [place], [place] Audry Riles, PharmD   Please be sure to bring in all your medications bottles to every appointment.   If you have any questions or concerns before your next appointment please send [place] a message through [place] or call our office at [phone].    TO LEAVE A MESSAGE FOR THE NURSE SELECT OPTION 2, PLEASE LEAVE A MESSAGE INCLUDING: YOUR NAME DATE OF BIRTH CALL BACK NUMBER REASON FOR CALL**this is important as we prioritize the call backs  YOU WILL RECEIVE A CALL BACK THE SAME DAY AS LONG AS YOU CALL BEFORE 4:00 PM

## 2022-04-13 ENCOUNTER — Telehealth (HOSPITAL_COMMUNITY): Payer: Self-pay | Admitting: Surgery

## 2022-04-13 NOTE — Telephone Encounter (Signed)
Patient called and results reviewed per Dr. Aundra Dubin. He was grateful for the information.

## 2022-04-13 NOTE — Telephone Encounter (Signed)
-----   Message from Larey Dresser, MD sent at 04/10/2022  7:25 PM EDT ----- Similar to prior, EF 45-50% (mildly decreased from prior MI).

## 2022-08-12 ENCOUNTER — Other Ambulatory Visit (HOSPITAL_COMMUNITY): Payer: Self-pay | Admitting: Cardiology

## 2022-12-09 ENCOUNTER — Encounter (HOSPITAL_COMMUNITY): Payer: Self-pay | Admitting: Cardiology

## 2022-12-09 ENCOUNTER — Ambulatory Visit (HOSPITAL_COMMUNITY)
Admission: RE | Admit: 2022-12-09 | Discharge: 2022-12-09 | Disposition: A | Discharge status: Home / Self Care | Payer: Managed Care, Other (non HMO) | Source: Ambulatory Visit | Attending: Cardiology | Admitting: Cardiology

## 2022-12-09 VITALS — BP 90/50 | HR 57 | Wt 146.0 lb

## 2022-12-09 DIAGNOSIS — Z8616 Personal history of COVID-19: Secondary | ICD-10-CM | POA: Diagnosis not present

## 2022-12-09 DIAGNOSIS — Z79899 Other long term (current) drug therapy: Secondary | ICD-10-CM | POA: Insufficient documentation

## 2022-12-09 DIAGNOSIS — Z7902 Long term (current) use of antithrombotics/antiplatelets: Secondary | ICD-10-CM | POA: Diagnosis not present

## 2022-12-09 DIAGNOSIS — Z7984 Long term (current) use of oral hypoglycemic drugs: Secondary | ICD-10-CM | POA: Diagnosis not present

## 2022-12-09 DIAGNOSIS — I255 Ischemic cardiomyopathy: Secondary | ICD-10-CM | POA: Diagnosis not present

## 2022-12-09 DIAGNOSIS — Z7982 Long term (current) use of aspirin: Secondary | ICD-10-CM | POA: Diagnosis not present

## 2022-12-09 DIAGNOSIS — M1711 Unilateral primary osteoarthritis, right knee: Secondary | ICD-10-CM | POA: Diagnosis not present

## 2022-12-09 DIAGNOSIS — E785 Hyperlipidemia, unspecified: Secondary | ICD-10-CM | POA: Insufficient documentation

## 2022-12-09 DIAGNOSIS — I5022 Chronic systolic (congestive) heart failure: Secondary | ICD-10-CM | POA: Insufficient documentation

## 2022-12-09 DIAGNOSIS — I252 Old myocardial infarction: Secondary | ICD-10-CM | POA: Insufficient documentation

## 2022-12-09 DIAGNOSIS — I959 Hypotension, unspecified: Secondary | ICD-10-CM | POA: Insufficient documentation

## 2022-12-09 DIAGNOSIS — Z955 Presence of coronary angioplasty implant and graft: Secondary | ICD-10-CM | POA: Insufficient documentation

## 2022-12-09 DIAGNOSIS — Z87891 Personal history of nicotine dependence: Secondary | ICD-10-CM | POA: Diagnosis not present

## 2022-12-09 DIAGNOSIS — I2510 Atherosclerotic heart disease of native coronary artery without angina pectoris: Secondary | ICD-10-CM | POA: Diagnosis not present

## 2022-12-09 DIAGNOSIS — R0789 Other chest pain: Secondary | ICD-10-CM | POA: Insufficient documentation

## 2022-12-09 LAB — BASIC METABOLIC PANEL
Anion gap: 7 (ref 5–15)
BUN: 11 mg/dL (ref 8–23)
CO2: 27 mmol/L (ref 22–32)
Calcium: 8.4 mg/dL — ABNORMAL LOW (ref 8.9–10.3)
Chloride: 103 mmol/L (ref 98–111)
Creatinine, Ser: 0.96 mg/dL (ref 0.61–1.24)
GFR, Estimated: >60 mL/min (ref >60)
Glucose, Bld: 60 mg/dL — ABNORMAL LOW (ref 70–99)
Potassium: 3.7 mmol/L (ref 3.5–5.1)
Sodium: 137 mmol/L (ref 135–145)

## 2022-12-09 LAB — LIPID PANEL
Cholesterol: 108 mg/dL (ref 0–200)
HDL: 34 mg/dL — ABNORMAL LOW (ref >40)
LDL Cholesterol: 56 mg/dL (ref 0–99)
Total CHOL/HDL Ratio: 3.2 RATIO
Triglycerides: 90 mg/dL (ref <150)
VLDL: 18 mg/dL (ref 0–40)

## 2022-12-09 NOTE — Patient Instructions (Signed)
There has been no changes to your medications   Labs done today, your results will be available in MyChart, we will contact you for abnormal readings.  Your physician has requested that you have an echocardiogram. Echocardiography is a painless test that uses sound waves to create images of your heart. It provides your doctor with information about the size and shape of your heart and how well your heart's chambers and valves are working. This procedure takes approximately one hour. There are no restrictions for this procedure. Please do NOT wear cologne, perfume, aftershave, or lotions (deodorant is allowed). Please arrive 15 minutes prior to your appointment time.  Your physician recommends that you schedule a follow-up appointment in: 6 months with an echocardiogram   If you have any questions or concerns before your next appointment please send [place] a message through [place] or call our office at [phone].    TO LEAVE A MESSAGE FOR THE NURSE SELECT OPTION 2, PLEASE LEAVE A MESSAGE INCLUDING: YOUR NAME DATE OF BIRTH CALL BACK NUMBER REASON FOR CALL**this is important as we prioritize the call backs  YOU WILL RECEIVE A CALL BACK THE SAME DAY AS LONG AS YOU CALL BEFORE 4:00 PM  At the [facility], you and your health needs are our priority. As part of our continuing mission to provide you with exceptional heart care, we have created designated Provider Care Teams. These Care Teams include your primary Cardiologist (physician) and Advanced Practice Providers (APPs- Physician Assistants and Nurse Practitioners) who all work together to provide you with the care you need, when you need it.   You may see any of the following providers on your designated Care Team at your next follow up: Dr Glori Bickers Dr Loralie Champagne Dr. Roxana Hires, NP Lyda Jester, [place] [facility] [place], [place] Forestine Na, NP Audry Riles, PharmD   Please be sure  to bring in all your medications bottles to every appointment.

## 2022-12-10 ENCOUNTER — Other Ambulatory Visit (HOSPITAL_COMMUNITY): Payer: Self-pay | Admitting: Cardiology

## 2022-12-10 NOTE — Progress Notes (Signed)
PCP: Dr. Coletta Memos Cardiology: Dr. [name] HF Cardiology: Dr. Aundra Dubin  67 y.o. with history of CAD s/p anterior MI in 11/16 and ischemic cardiomyopathy.  Patient was in good health until 11/16.  At that time, he had anterior STEMI treated with DES to proximal LAD.  Echo at the time showed EF 30-35%.  Since then, he has had 2 further echoes, both with EF 35-40%, most recently in 9/17.    He has severe OA R knee and is now s/p R TKR with Dr. Ronnie Derby with no complication.   He noted atypical chest pain in 5/18.  Cardiolite showed infarction without significant ischemia.  Cardiac MRI in 5/18 showed EF 44% with peri-apical scar. Echo in 5/19 showed EF 35-40% with wall motion abnormalities. CPX in 1/19 showed very mild HF limitation.    Echo in 7/20 showed EF 40-45% with peri-apical akinesis.  Cardiolite in 2/21 showed EF 51%, no ischemia, prior anteroseptal MI.  Echo in 1/22 showed EF 45-50% with apical akinesis and normal RV.   Cardiolite in 4/23 showed EF 38%, old anteroseptal MI with no ischemia.    He continued with worsening exertional symptoms at follow up 4/23, and R/LHC arranged, showing no obstructive CAD, low filling pressures with preserved CO. No cardiac explanation for symptoms.  Echo in 5/23 showed EF 45-50%, normal RV.   Patient returns for followup of CHF/CAD.  He still gets occasional "twinges" of atypical chest pain.  He fatigues with heavy exertion.  Generally no dyspnea walking on flat ground and does fine with stairs.  BP runs low chronically; he denies lightheadedness.   ECG (personally reviewed): NSR, old ASMI  Labs (2/17): LDL 46 Labs (11/17): K 3.9, creatinine 0.92, LFTs normal Labs (1/18): K 4.2, creatinine 0.92 Labs (5/18): K 4.5, creatinine 0.94, LDL 38, HDL 43 Labs (8/18): K 4.1, creatinine 0.91 Labs (7/19): LDL 42, K 4.1, creatinine 0.97 Labs (7/20): K 4, creatinine 0.9, LDL 42, hgb 14.1  Labs (1/21): K 4.7, creatinine 0.87, hgb 15, LDL 48 Labs (9/21): K 4, creatinine  0.89 Labs (1/22): LDL 50, HDL 49 Labs (2/22): K 4.2, creatinine 1.05 Labs (10/22): K 3.8, creatinine 0.88 Labs (4/23): K 4.5, creatinine 0.76, hgb 15.3, HDL 43, LDL 50  PMH: 1. Hyperlipidemia 2. OA: right knee. Right TKR 11/17.  3. CAD: Anterior STEMI 11/16.  Totally occluded LAD on cath => DES to proximal LAD.   - Cardiolite (5/18): EF 35%, large LAD territory infarction with no ischemia.  - Cardiolite (2/21): EF 515, no ischemia, prior anteroseptal MI.  - R/LHC (4/23): non-obstructive CAD, 40% stenoses ramus lesion, pLAD to mLAD lesion 15% stenosed, 1st diag lesion 50% stenosed, pRCA to mRCA lesion 15% stenosed. 4. Chronic systolic CHF: Ischemic cardiomyopathy. - 11/16 echo: EF 30-35%  - 2/17 echo: EF 35-40% with regional WMAs.  - 9/17 echo: EF 35-40%, anteroseptal/apical akinesis. - CPX (2/18): peak VO2 19.6, VE/VCO2 slope 30, RER 1.34.  Mild to moderate HF limitation.  - Cardiac MRI (5/18): EF 44%, mid anteroseptal, mid anterior, and peri-apical scar/akinesis, LGE pattern not suggestive of viability.  Mild RV dilation with normal systolic function.  - Echo (5/19): EF 35-40%, septal apical and anterior hypokinesis - CPX (1/20): Very mild HF limitation.  VO2 max 20.6, VE/VCO2 slope 31, RER 1.35, PFTs normal.  - Echo (7/20): EF 40-45%, peri-apical akinesis, normal RV.  - Echo (1/22): EF 45-50% with apical akinesis and normal RV.  - Cardiolite (4/23): EF 38%, old anteroseptal MI with no ischemia. -  R/LHC (4/23): no obstructive CAD, normal filling pressures and normal CO. - Echo (5/23): EF 45-50%, normal RV.  5. COVID-19 10/21  SH: Lives in [place], works as Optometrist, married, quit smoking > 15 years ago, occasional ETOH.   FH: No premature CAD  ROS: All systems reviewed and negative except as per HPI.   Current Outpatient Medications  Medication Sig Dispense Refill   acetaminophen (TYLENOL) 500 MG tablet Take 1,000 mg by mouth every 6 (six) hours as needed for mild pain.      aspirin EC 81 MG tablet Take 81 mg by mouth daily. Swallow whole.     atorvastatin (LIPITOR) 40 MG tablet TAKE 1 TABLET (40 MG TOTAL) BY MOUTH DAILY AT 6 PM. PLEASE SCHEDULE APPOINTMENT FOR FUTURE REFILLS 90 tablet 0   carvedilol (COREG) 3.125 MG tablet TAKE 1 TABLET BY MOUTH TWICE A DAY 60 tablet 5   clopidogrel (PLAVIX) 75 MG tablet TAKE 1 TABLET BY MOUTH EVERY DAY 30 tablet 11   gabapentin (NEURONTIN) 300 MG capsule Take 300 mg by mouth at bedtime. 1 - 2 tablets     lisinopril (ZESTRIL) 5 MG tablet Take 1 tablet (5 mg total) by mouth daily. 90 tablet 3   meloxicam (MOBIC) 7.5 MG tablet Take 7.5 mg by mouth daily.     nitroGLYCERIN (NITROSTAT) 0.4 MG SL tablet PLACE 1 TABLET (0.4 MG TOTAL) UNDER THE TONGUE EVERY 5 (FIVE) MINUTES X 3 DOSES AS NEEDED FOR CHEST 25 tablet 6   oxymetazoline (AFRIN) 0.05 % nasal spray Place 2 sprays into both nostrils as needed for congestion.     pantoprazole (PROTONIX) 40 MG tablet Take 1 tablet (40 mg total) by mouth daily. 30 tablet 1   sodium chloride (MURO 128) 5 % ophthalmic ointment Place 1 application into both eyes at bedtime.     spironolactone (ALDACTONE) 25 MG tablet TAKE 1 TABLET BY MOUTH EVERY DAY 90 tablet 3   zolpidem (AMBIEN) 10 MG tablet Take 10 mg by mouth at bedtime.     FARXIGA 10 MG TABS tablet TAKE 1 TABLET BY MOUTH DAILY BEFORE BREAKFAST. 90 tablet 3   No current facility-administered medications for this encounter.   BP (!) 90/50   Pulse (!) 57   Wt 66.2 kg (146 lb)   SpO2 99%   BMI 21.56 kg/m    Wt Readings from Last 3 Encounters:  12/09/22 66.2 kg (146 lb)  04/10/22 66 kg (145 lb 8 oz)  03/23/22 65.8 kg (145 lb)   General: NAD Neck: No JVD, no thyromegaly or thyroid nodule.  Lungs: Clear to auscultation bilaterally with normal respiratory effort. CV: Nondisplaced PMI.  Heart regular S1/S2, no S3/S4, no murmur.  No peripheral edema.  No carotid bruit.  Normal pedal pulses.  Abdomen: Soft, nontender, no hepatosplenomegaly, no  distention.  Skin: Intact without lesions or rashes.  Neurologic: Alert and oriented x 3.  Psych: Normal affect. Extremities: No clubbing or cyanosis.  HEENT: Normal.   Assessment/Plan: 1. CAD: s/p anterior MI 11/16 with DES to proximal LAD.  Cardiolite in 5/18, 2/21, and again in 4/23 with old anterior MI, no ischemia. However, patient continues to have exertional lateral chest pain as well as dyspnea.  The EF by 4/23 Cardiolite was lower than in the past.  Coronary angiography 4/23 showed non-obstructive disease, no explanation for current symptoms. He still has occasional left-sided atypical chest pain, stable from previous pattern. - He will continue ASA 81 and atorvastatin 40 daily, check lipids today.  -  Plavix 75 mg daily which I would have him continue long-term as long as he does not have bleeding issues.   2. Chronic systolic CHF:  Echo 8/33 with EF 35-40%.  He has been in this range since his MI.  Ischemic cardiomyopathy.  Prior CPX with mild to moderate limitation due to HF. Cardiac MRI with peri-apical scar and EF 44%. Echo in 5/19 with EF 35-40%.  CPX showed minimal HF limitation, normal PFTs.  Echo in 7/20 showed EF 40-45% and echo in 1/22 showed EF 45-50% with apical akinesis.  Cardiolite in 4/23 showed lower EF, 38%.  [place] 4/23 showed normal filling pressures with preserved CO.  Echo in 5/23 with EF 45-50%, normal RV.  NYHA class I-II symptoms.  Not volume overloaded.  Medical management limited by chronically low BP.  - Continue lisinopril 5 mg daily. BMET today.  - Continue spironolactone 25 mg daily.    - Continue Coreg 3.125 mg bid.     - Continue Farxiga 10 mg daily.  - EF has been out of range for ICD.   - Repeat echo at followup in 6 months.  3. Hyperlipidemia: Check lipids today.  Follow up in 6 months with echo.  Loralie Champagne  12/10/2022

## 2023-03-05 ENCOUNTER — Other Ambulatory Visit (HOSPITAL_COMMUNITY): Payer: Self-pay | Admitting: Cardiology

## 2023-06-14 ENCOUNTER — Ambulatory Visit (HOSPITAL_COMMUNITY)
Admission: RE | Admit: 2023-06-14 | Discharge: 2023-06-14 | Disposition: A | Discharge status: Home / Self Care | Payer: Managed Care, Other (non HMO) | Source: Ambulatory Visit | Attending: *Deleted | Admitting: *Deleted

## 2023-06-14 ENCOUNTER — Ambulatory Visit (HOSPITAL_BASED_OUTPATIENT_CLINIC_OR_DEPARTMENT_OTHER)
Admission: RE | Admit: 2023-06-14 | Discharge: 2023-06-14 | Disposition: A | Discharge status: Home / Self Care | Payer: Managed Care, Other (non HMO) | Source: Ambulatory Visit | Attending: Cardiology | Admitting: Cardiology

## 2023-06-14 ENCOUNTER — Encounter (HOSPITAL_COMMUNITY): Payer: Self-pay | Admitting: Cardiology

## 2023-06-14 VITALS — BP 92/58 | HR 52 | Wt 143.4 lb

## 2023-06-14 DIAGNOSIS — I5030 Unspecified diastolic (congestive) heart failure: Secondary | ICD-10-CM | POA: Diagnosis not present

## 2023-06-14 DIAGNOSIS — Z955 Presence of coronary angioplasty implant and graft: Secondary | ICD-10-CM | POA: Diagnosis not present

## 2023-06-14 DIAGNOSIS — I255 Ischemic cardiomyopathy: Secondary | ICD-10-CM | POA: Diagnosis not present

## 2023-06-14 DIAGNOSIS — I5022 Chronic systolic (congestive) heart failure: Secondary | ICD-10-CM | POA: Diagnosis not present

## 2023-06-14 DIAGNOSIS — I2510 Atherosclerotic heart disease of native coronary artery without angina pectoris: Secondary | ICD-10-CM | POA: Insufficient documentation

## 2023-06-14 DIAGNOSIS — R0789 Other chest pain: Secondary | ICD-10-CM | POA: Insufficient documentation

## 2023-06-14 DIAGNOSIS — Z7982 Long term (current) use of aspirin: Secondary | ICD-10-CM | POA: Insufficient documentation

## 2023-06-14 DIAGNOSIS — I252 Old myocardial infarction: Secondary | ICD-10-CM | POA: Diagnosis not present

## 2023-06-14 DIAGNOSIS — Z79899 Other long term (current) drug therapy: Secondary | ICD-10-CM | POA: Diagnosis not present

## 2023-06-14 DIAGNOSIS — I9589 Other hypotension: Secondary | ICD-10-CM | POA: Insufficient documentation

## 2023-06-14 DIAGNOSIS — R001 Bradycardia, unspecified: Secondary | ICD-10-CM | POA: Insufficient documentation

## 2023-06-14 DIAGNOSIS — Z87891 Personal history of nicotine dependence: Secondary | ICD-10-CM | POA: Insufficient documentation

## 2023-06-14 DIAGNOSIS — Z7902 Long term (current) use of antithrombotics/antiplatelets: Secondary | ICD-10-CM | POA: Insufficient documentation

## 2023-06-14 DIAGNOSIS — Z7984 Long term (current) use of oral hypoglycemic drugs: Secondary | ICD-10-CM | POA: Insufficient documentation

## 2023-06-14 DIAGNOSIS — E785 Hyperlipidemia, unspecified: Secondary | ICD-10-CM | POA: Diagnosis not present

## 2023-06-14 LAB — BASIC METABOLIC PANEL
Anion gap: 6 (ref 5–15)
BUN: 15 mg/dL (ref 8–23)
CO2: 27 mmol/L (ref 22–32)
Calcium: 8.7 mg/dL — ABNORMAL LOW (ref 8.9–10.3)
Chloride: 105 mmol/L (ref 98–111)
Creatinine, Ser: 0.98 mg/dL (ref 0.61–1.24)
GFR, Estimated: >60 mL/min (ref >60)
Glucose, Bld: 90 mg/dL (ref 70–99)
Potassium: 3.9 mmol/L (ref 3.5–5.1)
Sodium: 138 mmol/L (ref 135–145)

## 2023-06-14 LAB — ECHOCARDIOGRAM COMPLETE
AR max vel: 2.95 cm2
AV Area VTI: 2.73 cm2
AV Area mean vel: 2.82 cm2
AV Mean grad: 3 mmHg
AV Peak grad: 5.3 mmHg
Ao pk vel: 1.15 m/s
Area-P 1/2: 2.37 cm2
Calc EF: 50 %
MV M vel: 2.6 m/s
MV Peak grad: 27 mmHg
S' Lateral: 3.3 cm
Single Plane A2C EF: 51.4 %
Single Plane A4C EF: 49.5 %

## 2023-06-14 NOTE — Progress Notes (Signed)
PCP: Dr. Everlene Other Cardiology: Dr. Rennis Golden HF Cardiology: Dr. Shirlee Latch  67 y.o. with history of CAD s/p anterior MI in 11/16 and ischemic cardiomyopathy.  Patient was in good health until 11/16.  At that time, he had anterior STEMI treated with DES to proximal LAD.  Echo at the time showed EF 30-35%.  Since then, he has had 2 further echoes, both with EF 35-40%, most recently in 9/17.    He has severe OA R knee and is now s/p R TKR with Dr. Sherlean Foot with no complication.   He noted atypical chest pain in 5/18.  Cardiolite showed infarction without significant ischemia.  Cardiac MRI in 5/18 showed EF 44% with peri-apical scar. Echo in 5/19 showed EF 35-40% with wall motion abnormalities. CPX in 1/19 showed very mild HF limitation.    Echo in 7/20 showed EF 40-45% with peri-apical akinesis.  Cardiolite in 2/21 showed EF 51%, no ischemia, prior anteroseptal MI.  Echo in 1/22 showed EF 45-50% with apical akinesis and normal RV.   Cardiolite in 4/23 showed EF 38%, old anteroseptal MI with no ischemia.    He continued with worsening exertional symptoms at follow up 4/23, and R/LHC arranged, showing no obstructive CAD, low filling pressures with preserved CO. No cardiac explanation for symptoms.  Echo in 5/23 showed EF 45-50%, normal RV.   Echo was done today and reviewed, EF 45-50%, apical akinesis, normal RV.   Patient returns for followup of CHF/CAD.  No chest pain. Poor stamina with heavy yardwork like clipping hedges. No dyspnea with normal walking on flat ground or walking up stairs. Weight down 3 lbs.   ECG (personally reviewed): NSR, old ASMI  Labs (2/17): LDL 46 Labs (11/17): K 3.9, creatinine 0.92, LFTs normal Labs (1/18): K 4.2, creatinine 0.92 Labs (5/18): K 4.5, creatinine 0.94, LDL 38, HDL 43 Labs (8/18): K 4.1, creatinine 0.91 Labs (7/19): LDL 42, K 4.1, creatinine 0.98 Labs (7/20): K 4, creatinine 0.9, LDL 42, hgb 14.1  Labs (1/21): K 4.7, creatinine 0.87, hgb 15, LDL 48 Labs (9/21): K  4, creatinine 0.89 Labs (1/22): LDL 50, HDL 49 Labs (2/22): K 4.2, creatinine 1.05 Labs (10/22): K 3.8, creatinine 0.88 Labs (4/23): K 4.5, creatinine 0.76, hgb 15.3, HDL 43, LDL 50 Labs (1/24): LDL 56, K 3.7, creatinine 1.19  PMH: 1. Hyperlipidemia 2. OA: right knee. Right TKR 11/17.  3. CAD: Anterior STEMI 11/16.  Totally occluded LAD on cath => DES to proximal LAD.   - Cardiolite (5/18): EF 35%, large LAD territory infarction with no ischemia.  - Cardiolite (2/21): EF 515, no ischemia, prior anteroseptal MI.  - R/LHC (4/23): non-obstructive CAD, 40% stenoses ramus lesion, pLAD to mLAD lesion 15% stenosed, 1st diag lesion 50% stenosed, pRCA to mRCA lesion 15% stenosed. 4. Chronic systolic CHF: Ischemic cardiomyopathy. - 11/16 echo: EF 30-35%  - 2/17 echo: EF 35-40% with regional WMAs.  - 9/17 echo: EF 35-40%, anteroseptal/apical akinesis. - CPX (2/18): peak VO2 19.6, VE/VCO2 slope 30, RER 1.34.  Mild to moderate HF limitation.  - Cardiac MRI (5/18): EF 44%, mid anteroseptal, mid anterior, and peri-apical scar/akinesis, LGE pattern not suggestive of viability.  Mild RV dilation with normal systolic function.  - Echo (5/19): EF 35-40%, septal apical and anterior hypokinesis - CPX (1/20): Very mild HF limitation.  VO2 max 20.6, VE/VCO2 slope 31, RER 1.35, PFTs normal.  - Echo (7/20): EF 40-45%, peri-apical akinesis, normal RV.  - Echo (1/22): EF 45-50% with apical akinesis and normal RV.  -  Cardiolite (4/23): EF 38%, old anteroseptal MI with no ischemia. - R/LHC (4/23): no obstructive CAD, normal filling pressures and normal CO. - Echo (5/23): EF 45-50%, normal RV.  - Echo (7/24): EF 45-50%, apical akinesis, normal RV.  5. COVID-19 10/21  SH: Lives in [place], works as Airline pilot, married, quit smoking > 15 years ago, occasional ETOH.   FH: No premature CAD  ROS: All systems reviewed and negative except as per HPI.   Current Outpatient Medications  Medication Sig Dispense Refill    acetaminophen (TYLENOL) 500 MG tablet Take 1,000 mg by mouth every 6 (six) hours as needed for mild pain.     aspirin EC 81 MG tablet Take 81 mg by mouth daily. Swallow whole.     atorvastatin (LIPITOR) 40 MG tablet TAKE 1 TABLET (40 MG TOTAL) BY MOUTH DAILY AT 6 PM. PLEASE SCHEDULE APPOINTMENT FOR FUTURE REFILLS 90 tablet 0   carvedilol (COREG) 3.125 MG tablet TAKE 1 TABLET BY MOUTH TWICE A DAY 60 tablet 5   clopidogrel (PLAVIX) 75 MG tablet TAKE 1 TABLET BY MOUTH EVERY DAY 30 tablet 11   FARXIGA 10 MG TABS tablet TAKE 1 TABLET BY MOUTH EVERY DAY BEFORE BREAKFAST 90 tablet 3   gabapentin (NEURONTIN) 300 MG capsule Take 300 mg by mouth at bedtime. 1 - 2 tablets     lisinopril (ZESTRIL) 5 MG tablet TAKE 1 TABLET (5 MG TOTAL) BY MOUTH DAILY. 90 tablet 3   meloxicam (MOBIC) 7.5 MG tablet Take 7.5 mg by mouth daily.     nitroGLYCERIN (NITROSTAT) 0.4 MG SL tablet PLACE 1 TABLET (0.4 MG TOTAL) UNDER THE TONGUE EVERY 5 (FIVE) MINUTES X 3 DOSES AS NEEDED FOR CHEST 25 tablet 6   oxymetazoline (AFRIN) 0.05 % nasal spray Place 2 sprays into both nostrils as needed for congestion.     pantoprazole (PROTONIX) 40 MG tablet Take 1 tablet (40 mg total) by mouth daily. 30 tablet 1   sodium chloride (MURO 128) 5 % ophthalmic ointment Place 1 application into both eyes at bedtime.     spironolactone (ALDACTONE) 25 MG tablet TAKE 1 TABLET BY MOUTH EVERY DAY 90 tablet 3   zolpidem (AMBIEN) 10 MG tablet Take 10 mg by mouth at bedtime.     No current facility-administered medications for this encounter.   BP (!) 92/58   Pulse (!) 52   Wt 65 kg (143 lb 6.4 oz)   SpO2 100%   BMI 21.18 kg/m    Wt Readings from Last 3 Encounters:  06/14/23 65 kg (143 lb 6.4 oz)  12/09/22 66.2 kg (146 lb)  04/10/22 66 kg (145 lb 8 oz)   General: NAD Neck: No JVD, no thyromegaly or thyroid nodule.  Lungs: Clear to auscultation bilaterally with normal respiratory effort. CV: Nondisplaced PMI.  Heart regular S1/S2, no S3/S4, no  murmur.  No peripheral edema.  No carotid bruit.  Normal pedal pulses.  Abdomen: Soft, nontender, no hepatosplenomegaly, no distention.  Skin: Intact without lesions or rashes.  Neurologic: Alert and oriented x 3.  Psych: Normal affect. Extremities: No clubbing or cyanosis.  HEENT: Normal.   Assessment/Plan: 1. CAD: s/p anterior MI 11/16 with DES to proximal LAD.  Cardiolite in 5/18, 2/21, and again in 4/23 with old anterior MI, no ischemia. However, patient continues to have exertional lateral chest pain as well as dyspnea.  The EF by 4/23 Cardiolite was lower than in the past.  Coronary angiography 4/23 showed non-obstructive disease.  No chest pain.  -  He will continue ASA 81 and atorvastatin 40 daily, lipids ok in 1/24.  - Plavix 75 mg daily which I would have him continue long-term as long as he does not have bleeding issues.   2. Chronic systolic CHF:  Echo 9/17 with EF 35-40%.  He has been in this range since his MI.  Ischemic cardiomyopathy.  Prior CPX with mild to moderate limitation due to HF. Cardiac MRI with peri-apical scar and EF 44%. Echo in 5/19 with EF 35-40%.  CPX showed minimal HF limitation, normal PFTs.  Echo in 7/20 showed EF 40-45% and echo in 1/22 showed EF 45-50% with apical akinesis.  Cardiolite in 4/23 showed lower EF, 38%.  RHC 4/23 showed normal filling pressures with preserved CO.  Echo in 5/23 with EF 45-50%, normal RV.  Echo today with EF 45-50%, apical akinesis, normal RV.  NYHA class II symptoms, stable.  Not volume overloaded.  Medical management limited by chronically low BP.  - Continue lisinopril 5 mg daily. BMET today.  - Continue spironolactone 25 mg daily.    - Continue Coreg 3.125 mg bid.     - Continue Farxiga 10 mg daily.  - EF has been out of range for ICD.   3. Hyperlipidemia: Good lipids in 1/24.   Follow up in 6 months   Marca Ancona  06/14/2023

## 2023-06-14 NOTE — Patient Instructions (Signed)
Medication Changes:  No Changes In Medications at this time.   Lab Work:  Labs done today, your results will be available in MyChart, we will contact you for abnormal readings.  Follow-Up in: 6 MONTHS PLEASE CALL OUR OFFICE AROUND NOVEMBER 2024 TO GET SCHEDULED FOR YOUR APPOINTMENT. PHONE NUMBER IS [phone] OPTION 2    At the [facility], you and your health needs are our priority. We have a designated team specialized in the treatment of Heart Failure. This Care Team includes your primary Heart Failure Specialized Cardiologist (physician), Advanced Practice Providers (APPs- Physician Assistants and Nurse Practitioners), and Pharmacist who all work together to provide you with the care you need, when you need it.   You may see any of the following providers on your designated Care Team at your next follow up:  Dr. Arvilla Meres Dr. Marca Ancona Dr. Marcos Eke, NP Robbie Lis, [place] [facility] [place], [place] Brynda Peon, NP Karle Plumber, PharmD   Please be sure to bring in all your medications bottles to every appointment.   Need to Contact us:  If you have any questions or concerns before your next appointment please send [place] a message through [place] or call our office at [phone].    TO LEAVE A MESSAGE FOR THE NURSE SELECT OPTION 2, PLEASE LEAVE A MESSAGE INCLUDING: YOUR NAME DATE OF BIRTH CALL BACK NUMBER REASON FOR CALL**this is important as we prioritize the call backs  YOU WILL RECEIVE A CALL BACK THE SAME DAY AS LONG AS YOU CALL BEFORE 4:00 PM

## 2023-08-01 ENCOUNTER — Other Ambulatory Visit (HOSPITAL_COMMUNITY): Payer: Self-pay | Admitting: Cardiology

## 2023-10-04 ENCOUNTER — Telehealth (HOSPITAL_COMMUNITY): Payer: Self-pay | Admitting: Cardiology

## 2023-10-04 DIAGNOSIS — I5022 Chronic systolic (congestive) heart failure: Secondary | ICD-10-CM

## 2023-10-04 NOTE — Telephone Encounter (Signed)
Pt called to report low b/p readings Normally 90/60 At most recent OV 88/55 with pcp Home b/p reading 80's/50's Asymptotic no dizziness,no lightheadedness Reports he is not drinking a lot of fluids through out the day maybe 20 ounces   Due for follow up Jan 2025   Please advise

## 2023-10-04 NOTE — Telephone Encounter (Signed)
Drink fluids, get BMET.  If stable, no changes as long as not dizzy.

## 2023-10-05 NOTE — Telephone Encounter (Signed)
Pt aware.

## 2023-10-08 ENCOUNTER — Ambulatory Visit (HOSPITAL_COMMUNITY)
Admission: RE | Admit: 2023-10-08 | Discharge: 2023-10-08 | Disposition: A | Discharge status: Home / Self Care | Payer: Managed Care, Other (non HMO) | Source: Ambulatory Visit | Attending: Cardiology | Admitting: Cardiology

## 2023-10-08 DIAGNOSIS — I5022 Chronic systolic (congestive) heart failure: Secondary | ICD-10-CM | POA: Diagnosis present

## 2023-10-08 LAB — BASIC METABOLIC PANEL
Anion gap: 6 (ref 5–15)
BUN: 15 mg/dL (ref 8–23)
CO2: 25 mmol/L (ref 22–32)
Calcium: 8.7 mg/dL — ABNORMAL LOW (ref 8.9–10.3)
Chloride: 108 mmol/L (ref 98–111)
Creatinine, Ser: 0.97 mg/dL (ref 0.61–1.24)
GFR, Estimated: >60 mL/min (ref >60)
Glucose, Bld: 124 mg/dL — ABNORMAL HIGH (ref 70–99)
Potassium: 3.9 mmol/L (ref 3.5–5.1)
Sodium: 139 mmol/L (ref 135–145)

## 2023-12-09 ENCOUNTER — Other Ambulatory Visit (HOSPITAL_COMMUNITY): Payer: Self-pay | Admitting: Cardiology

## 2023-12-15 ENCOUNTER — Other Ambulatory Visit (HOSPITAL_COMMUNITY): Payer: Self-pay | Admitting: Cardiology

## 2023-12-22 ENCOUNTER — Encounter (HOSPITAL_COMMUNITY): Payer: Self-pay | Admitting: Cardiology

## 2023-12-22 ENCOUNTER — Ambulatory Visit (HOSPITAL_COMMUNITY)
Admission: RE | Admit: 2023-12-22 | Discharge: 2023-12-22 | Disposition: A | Discharge status: Home / Self Care | Payer: Managed Care, Other (non HMO) | Source: Ambulatory Visit | Attending: Cardiology | Admitting: Cardiology

## 2023-12-22 VITALS — BP 88/50 | HR 60 | Wt 140.6 lb

## 2023-12-22 DIAGNOSIS — Z96651 Presence of right artificial knee joint: Secondary | ICD-10-CM | POA: Insufficient documentation

## 2023-12-22 DIAGNOSIS — E785 Hyperlipidemia, unspecified: Secondary | ICD-10-CM | POA: Diagnosis not present

## 2023-12-22 DIAGNOSIS — I2510 Atherosclerotic heart disease of native coronary artery without angina pectoris: Secondary | ICD-10-CM | POA: Diagnosis not present

## 2023-12-22 DIAGNOSIS — R9431 Abnormal electrocardiogram [ECG] [EKG]: Secondary | ICD-10-CM | POA: Diagnosis not present

## 2023-12-22 DIAGNOSIS — Z7902 Long term (current) use of antithrombotics/antiplatelets: Secondary | ICD-10-CM | POA: Insufficient documentation

## 2023-12-22 DIAGNOSIS — I252 Old myocardial infarction: Secondary | ICD-10-CM | POA: Insufficient documentation

## 2023-12-22 DIAGNOSIS — Z955 Presence of coronary angioplasty implant and graft: Secondary | ICD-10-CM | POA: Diagnosis not present

## 2023-12-22 DIAGNOSIS — I255 Ischemic cardiomyopathy: Secondary | ICD-10-CM | POA: Diagnosis present

## 2023-12-22 DIAGNOSIS — I509 Heart failure, unspecified: Secondary | ICD-10-CM | POA: Diagnosis present

## 2023-12-22 DIAGNOSIS — I5022 Chronic systolic (congestive) heart failure: Secondary | ICD-10-CM | POA: Insufficient documentation

## 2023-12-22 LAB — LIPID PANEL
Cholesterol: 116 mg/dL (ref 0–200)
HDL: 49 mg/dL (ref >40)
LDL Cholesterol: 58 mg/dL (ref 0–99)
Total CHOL/HDL Ratio: 2.4 {ratio}
Triglycerides: 46 mg/dL (ref <150)
VLDL: 9 mg/dL (ref 0–40)

## 2023-12-22 LAB — BASIC METABOLIC PANEL
Anion gap: 9 (ref 5–15)
BUN: 16 mg/dL (ref 8–23)
CO2: 26 mmol/L (ref 22–32)
Calcium: 8.9 mg/dL (ref 8.9–10.3)
Chloride: 101 mmol/L (ref 98–111)
Creatinine, Ser: 0.89 mg/dL (ref 0.61–1.24)
GFR, Estimated: >60 mL/min (ref >60)
Glucose, Bld: 86 mg/dL (ref 70–99)
Potassium: 4.3 mmol/L (ref 3.5–5.1)
Sodium: 136 mmol/L (ref 135–145)

## 2023-12-22 MED ORDER — LISINOPRIL 2.5 MG PO TABS: 2.5000 mg | ORAL_TABLET | Freq: Every evening | ORAL | 3 refills | Status: AC

## 2023-12-22 MED ORDER — SPIRONOLACTONE 25 MG PO TABS: 25.0000 mg | ORAL_TABLET | Freq: Every evening | ORAL | Status: DC

## 2023-12-22 NOTE — Patient Instructions (Signed)
DECREASE Lisinopril to 2.5 mg nightly  CHANGE Spironolactone to nightly.  Labs done today, your results will be available in MyChart, we will contact you for abnormal readings.  Your physician recommends that you schedule a follow-up appointment in: 4 months  If you have any questions or concerns before your next appointment please send [place] a message through [place] or call our office at [phone].    TO LEAVE A MESSAGE FOR THE NURSE SELECT OPTION 2, PLEASE LEAVE A MESSAGE INCLUDING: YOUR NAME DATE OF BIRTH CALL BACK NUMBER REASON FOR CALL**this is important as we prioritize the call backs  YOU WILL RECEIVE A CALL BACK THE SAME DAY AS LONG AS YOU CALL BEFORE 4:00 PM  At the [facility], you and your health needs are our priority. As part of our continuing mission to provide you with exceptional heart care, we have created designated Provider Care Teams. These Care Teams include your primary Cardiologist (physician) and Advanced Practice Providers (APPs- Physician Assistants and Nurse Practitioners) who all work together to provide you with the care you need, when you need it.   You may see any of the following providers on your designated Care Team at your next follow up: Dr Arvilla Meres Dr Marca Ancona Dr. Dorthula Nettles Dr. Clearnce Hasten Amy Filbert Schilder, NP Robbie Lis, [place] [facility], [place] Brynda Peon, NP [place], NP Karle Plumber, PharmD   Please be sure to bring in all your medications bottles to every appointment.    Thank you for choosing [facility]

## 2023-12-22 NOTE — Progress Notes (Signed)
PCP: Dr. Everlene Other Cardiology: Dr. Rennis Golden HF Cardiology: Dr. Shirlee Latch  Chief complaint: CHF  68 y.o. with history of CAD s/p anterior MI in 11/16 and ischemic cardiomyopathy.  Patient was in good health until 11/16.  At that time, he had anterior STEMI treated with DES to proximal LAD.  Echo at the time showed EF 30-35%.  Since then, he has had 2 further echoes, both with EF 35-40%, most recently in 9/17.    He has severe OA R knee and is now s/p R TKR with Dr. Sherlean Foot with no complication.   He noted atypical chest pain in 5/18.  Cardiolite showed infarction without significant ischemia.  Cardiac MRI in 5/18 showed EF 44% with peri-apical scar. Echo in 5/19 showed EF 35-40% with wall motion abnormalities. CPX in 1/19 showed very mild HF limitation.    Echo in 7/20 showed EF 40-45% with peri-apical akinesis.  Cardiolite in 2/21 showed EF 51%, no ischemia, prior anteroseptal MI.  Echo in 1/22 showed EF 45-50% with apical akinesis and normal RV.   Cardiolite in 4/23 showed EF 38%, old anteroseptal MI with no ischemia.    He continued with worsening exertional symptoms at follow up 4/23, and R/LHC arranged, showing no obstructive CAD, low filling pressures with preserved CO. No cardiac explanation for symptoms.  Echo in 5/23 showed EF 45-50%, normal RV.   Echo in 7/24 showed EF 45-50%, apical akinesis, normal RV.   Patient returns for followup of CHF/CAD.  His BP has been running lower recently.  Sometimes down in the 70s systolic at home.  Today, 88/50.  He denies lightheadedness, syncope, falls. He does feel tired at times. No dyspnea except with heavy exertion.  No chest pain. No orthopnea/PND. He has right shoulder pain likely from a rotator cuff injury.   ECG (personally reviewed): NSR, old anterior MI  Labs (2/17): LDL 46 Labs (11/17): K 3.9, creatinine 0.92, LFTs normal Labs (1/18): K 4.2, creatinine 0.92 Labs (5/18): K 4.5, creatinine 0.94, LDL 38, HDL 43 Labs (8/18): K 4.1, creatinine  0.91 Labs (7/19): LDL 42, K 4.1, creatinine 1.61 Labs (7/20): K 4, creatinine 0.9, LDL 42, hgb 14.1  Labs (1/21): K 4.7, creatinine 0.87, hgb 15, LDL 48 Labs (9/21): K 4, creatinine 0.89 Labs (1/22): LDL 50, HDL 49 Labs (2/22): K 4.2, creatinine 1.05 Labs (10/22): K 3.8, creatinine 0.88 Labs (4/23): K 4.5, creatinine 0.76, hgb 15.3, HDL 43, LDL 50 Labs (1/24): LDL 56, K 3.7, creatinine 0.96 Labs (11/24): K 3.9, creatinine 0.97  PMH: 1. Hyperlipidemia 2. OA: right knee. Right TKR 11/17.  3. CAD: Anterior STEMI 11/16.  Totally occluded LAD on cath => DES to proximal LAD.   - Cardiolite (5/18): EF 35%, large LAD territory infarction with no ischemia.  - Cardiolite (2/21): EF 515, no ischemia, prior anteroseptal MI.  - R/LHC (4/23): non-obstructive CAD, 40% stenoses ramus lesion, pLAD to mLAD lesion 15% stenosed, 1st diag lesion 50% stenosed, pRCA to mRCA lesion 15% stenosed. 4. Chronic systolic CHF: Ischemic cardiomyopathy. - 11/16 echo: EF 30-35%  - 2/17 echo: EF 35-40% with regional WMAs.  - 9/17 echo: EF 35-40%, anteroseptal/apical akinesis. - CPX (2/18): peak VO2 19.6, VE/VCO2 slope 30, RER 1.34.  Mild to moderate HF limitation.  - Cardiac MRI (5/18): EF 44%, mid anteroseptal, mid anterior, and peri-apical scar/akinesis, LGE pattern not suggestive of viability.  Mild RV dilation with normal systolic function.  - Echo (5/19): EF 35-40%, septal apical and anterior hypokinesis - CPX (1/20): Very  mild HF limitation.  VO2 max 20.6, VE/VCO2 slope 31, RER 1.35, PFTs normal.  - Echo (7/20): EF 40-45%, peri-apical akinesis, normal RV.  - Echo (1/22): EF 45-50% with apical akinesis and normal RV.  - Cardiolite (4/23): EF 38%, old anteroseptal MI with no ischemia. - R/LHC (4/23): no obstructive CAD, normal filling pressures and normal CO. - Echo (5/23): EF 45-50%, normal RV.  - Echo (7/24): EF 45-50%, apical akinesis, normal RV.  5. COVID-19 10/21  SH: Lives in [place], works as Airline pilot,  married, quit smoking > 15 years ago, occasional ETOH.   FH: No premature CAD  ROS: All systems reviewed and negative except as per HPI.   Current Outpatient Medications  Medication Sig Dispense Refill   acetaminophen (TYLENOL) 500 MG tablet Take 1,000 mg by mouth every 6 (six) hours as needed for mild pain.     aspirin EC 81 MG tablet Take 81 mg by mouth daily. Swallow whole.     atorvastatin (LIPITOR) 40 MG tablet TAKE 1 TABLET (40 MG TOTAL) BY MOUTH DAILY AT 6 PM. PLEASE SCHEDULE APPOINTMENT FOR FUTURE REFILLS 90 tablet 0   carvedilol (COREG) 3.125 MG tablet TAKE 1 TABLET BY MOUTH TWICE A DAY 60 tablet 5   clopidogrel (PLAVIX) 75 MG tablet TAKE 1 TABLET BY MOUTH EVERY DAY 30 tablet 11   FARXIGA 10 MG TABS tablet TAKE 1 TABLET BY MOUTH EVERY DAY BEFORE BREAKFAST 90 tablet 3   gabapentin (NEURONTIN) 300 MG capsule Take 300 mg by mouth at bedtime. 1 - 2 tablets     meloxicam (MOBIC) 7.5 MG tablet Take 7.5 mg by mouth daily.     nitroGLYCERIN (NITROSTAT) 0.4 MG SL tablet PLACE 1 TABLET (0.4 MG TOTAL) UNDER THE TONGUE EVERY 5 (FIVE) MINUTES X 3 DOSES AS NEEDED FOR CHEST 25 tablet 6   oxymetazoline (AFRIN) 0.05 % nasal spray Place 2 sprays into both nostrils as needed for congestion.     pantoprazole (PROTONIX) 40 MG tablet Take 1 tablet (40 mg total) by mouth daily. 30 tablet 1   sodium chloride (MURO 128) 5 % ophthalmic ointment Place 1 application into both eyes at bedtime.     zolpidem (AMBIEN) 10 MG tablet Take 10 mg by mouth at bedtime.     lisinopril (ZESTRIL) 2.5 MG tablet Take 1 tablet (2.5 mg total) by mouth at bedtime. 90 tablet 3   spironolactone (ALDACTONE) 25 MG tablet Take 1 tablet (25 mg total) by mouth at bedtime.     No current facility-administered medications for this encounter.   BP (!) 88/50   Pulse 60   Wt 63.8 kg (140 lb 9.6 oz)   SpO2 99%   BMI 20.76 kg/m    Wt Readings from Last 3 Encounters:  12/22/23 63.8 kg (140 lb 9.6 oz)  06/14/23 65 kg (143 lb 6.4 oz)   12/09/22 66.2 kg (146 lb)   General: NAD, thin Neck: No JVD, no thyromegaly or thyroid nodule.  Lungs: Clear to auscultation bilaterally with normal respiratory effort. CV: Nondisplaced PMI.  Heart regular S1/S2, no S3/S4, no murmur.  No peripheral edema.  No carotid bruit.  Normal pedal pulses.  Abdomen: Soft, nontender, no hepatosplenomegaly, no distention.  Skin: Intact without lesions or rashes.  Neurologic: Alert and oriented x 3.  Psych: Normal affect. Extremities: No clubbing or cyanosis.  HEENT: Normal.    Assessment/Plan: 1. CAD: s/p anterior MI 11/16 with DES to proximal LAD.  Cardiolite in 5/18, 2/21, and again in 4/23  with old anterior MI, no ischemia. However, patient continues to have exertional lateral chest pain as well as dyspnea.  The EF by 4/23 Cardiolite was lower than in the past.  Coronary angiography 4/23 showed non-obstructive disease.  No chest pain.  - He will continue ASA 81 and atorvastatin 40 daily, check lipids today.  - Continue Plavix 75 mg daily which I would have him continue long-term as long as he does not have bleeding issues.   2. Chronic systolic CHF:  Echo 9/17 with EF 35-40%.  He has been in this range since his MI.  Ischemic cardiomyopathy.  Prior CPX with mild to moderate limitation due to HF. Cardiac MRI with peri-apical scar and EF 44%. Echo in 5/19 with EF 35-40%.  CPX showed minimal HF limitation, normal PFTs.  Echo in 7/20 showed EF 40-45% and echo in 1/22 showed EF 45-50% with apical akinesis.  Cardiolite in 4/23 showed lower EF, 38%.  RHC 4/23 showed normal filling pressures with preserved CO.  Echo in 5/23 with EF 45-50%, normal RV.  Echo in 7/24 with EF 45-50%, apical akinesis, normal RV.  NYHA class II and not volume overloaded.  GDMT has been limited by low BP which seems worse recently though he is asymptomatic.  - Decrease lisinopril to 2.5 mg daily and take at night.  - Continue spironolactone 25 mg daily, take at night.  BMET today.  -  Continue Coreg 3.125 mg bid.     - Continue Farxiga 10 mg daily.  - EF has been out of range for ICD.   3. Hyperlipidemia: Check lipids today, goal LDL < 70.    Follow up in 4 months   I spent 31 minutes reviewing chart, interacting with patient, and managing orders.   Marca Ancona  12/22/2023

## 2023-12-29 NOTE — Progress Notes (Signed)
 Joseph Ileana Collet, PhD, LAT, ATC acting as a scribe for Artist Lloyd, MD.  Joseph Riddle is a 68 y.o. male who presents to [facility] at [facility] today for R shoulder pain, worsening over the last 3-4wks. He recalling doing some digging back in Nov and pain would come and go. Pt locates pain to lateral aspect of his R shoulder. He is RHD. Pain is bothersome at night.  Radiates: no Aggravates: shoulder aBd, overhead, ER, R-side lying Treatments tried: limited use  He had a right knee replacement around 2017.  This was about a year after his heart attack.  He had physical therapy following the knee replacement.  He notes his knee just does not feel right and feels a little weak or uncomfortable at times.  Pertinent review of systems: No fevers or chills  Relevant historical information: History of MI causing chronic systolic heart failure with an EF of around 35 to 40%.  This is currently well-managed by Dr. Rolan. History of right total knee replacement Dr. Dorian November 2017   Exam:  BP 94/62   Pulse 70   Ht 5' 9 (1.753 m)   Wt 140 lb (63.5 kg)   SpO2 98%   BMI 20.67 kg/m  General: Well Developed, well nourished, and in no acute distress.   MSK: Right shoulder normal-appearing Normal motion pain with abduction and functional internal rotation. Strength abduction 4/5 external rotation 4/5 internal rotation 4+/5. Positive Hawkins and Neer's test.  Positive empty can test. Negative Yergason's and speeds test.  Right knee normal motion.    Lab and Radiology Results  Procedure: Real-time Ultrasound Guided Injection of right shoulder subacromial bursa Device: Philips Affiniti 50G/GE Logiq Images permanently stored and available for review in PACS Ultrasound evaluation prior to injection shows no significant retracted rotator cuff tear.  Mild subacromial bursitis is present. Hypoechoic fluid surrounds biceps tendon within tendon sheath.  No biceps tendon  tear visible. Verbal informed consent obtained.  Discussed risks and benefits of procedure. Warned about infection, bleeding, hyperglycemia damage to structures among others. Patient expresses understanding and agreement Time-out conducted.   Noted no overlying erythema, induration, or other signs of local infection.   Skin prepped in a sterile fashion.   Local anesthesia: Topical Ethyl chloride.   With sterile technique and under real time ultrasound guidance: 40 mg of Kenalog and 2 ml of Marcaine  injected into subacromial bursa. Fluid seen entering the bursa.   Completed without difficulty   Pain immediately resolved suggesting accurate placement of the medication.   Advised to call if fevers/chills, erythema, induration, drainage, or persistent bleeding.   Images permanently stored and available for review in the ultrasound unit.  Impression: Technically successful ultrasound guided injection.    X-ray images right shoulder obtained today personally and independently interpreted. No acute fractures.  No severe DJD.  Mild AC DJD present. Await formal radiology review    Assessment and Plan: 68 y.o. male with chronic right shoulder pain ongoing for about 3 months.  Pain is worsening recently.  Physical exam is most consistent with subacromial impingement and bursitis.  Ultrasound exam supports this diagnosis as well.  He had immediate relief with subacromial injection which we can expect will provide some benefit as well.  Plan for physical therapy for the right shoulder pain and the continued right knee discomfort/pain.  Recheck in 8 weeks.   PDMP not reviewed this encounter. Orders Placed This Encounter  Procedures   US  LIMITED JOINT SPACE  STRUCTURES UP RIGHT(NO LINKED CHARGES)    Reason for Exam (SYMPTOM  OR DIAGNOSIS REQUIRED):   right shoulder pain    Preferred imaging location?:   [facility] Shoulder Right    Standing Status:   Future     Number of Occurrences:   1    Expiration Date:   12/29/2024    Reason for Exam (SYMPTOM  OR DIAGNOSIS REQUIRED):   right shoulder pain    Preferred imaging location?:    [facility]   Ambulatory referral to Physical Therapy    Referral Priority:   Routine    Referral Type:   Physical Medicine    Referral Reason:   Specialty Services Required    Requested Specialty:   Physical Therapy    Number of Visits Requested:   1   No orders of the defined types were placed in this encounter.    Discussed warning signs or symptoms. Please see discharge instructions. Patient expresses understanding.   The above documentation has been reviewed and is accurate and complete Artist Lloyd, M.D.

## 2023-12-30 ENCOUNTER — Ambulatory Visit: Payer: Managed Care, Other (non HMO) | Admitting: Family Medicine

## 2023-12-30 ENCOUNTER — Ambulatory Visit (INDEPENDENT_AMBULATORY_CARE_PROVIDER_SITE_OTHER): Payer: Managed Care, Other (non HMO)

## 2023-12-30 ENCOUNTER — Other Ambulatory Visit: Payer: Self-pay

## 2023-12-30 VITALS — BP 94/62 | HR 70 | Ht 69.0 in | Wt 140.0 lb

## 2023-12-30 DIAGNOSIS — M25511 Pain in right shoulder: Secondary | ICD-10-CM | POA: Diagnosis not present

## 2023-12-30 DIAGNOSIS — G8929 Other chronic pain: Secondary | ICD-10-CM

## 2023-12-30 DIAGNOSIS — Z96651 Presence of right artificial knee joint: Secondary | ICD-10-CM

## 2023-12-30 DIAGNOSIS — M25561 Pain in right knee: Secondary | ICD-10-CM

## 2023-12-30 NOTE — Patient Instructions (Addendum)
Thank you for coming in today.   Please get an Xray today before you leave   You received an injection today. Seek immediate medical attention if the joint becomes red, extremely painful, or is oozing fluid.   I've referred you to Physical Therapy.  Let us know if you don't hear from them in one week.   Check back in 8 weeks

## 2024-01-13 ENCOUNTER — Encounter: Payer: Self-pay | Admitting: Family Medicine

## 2024-01-13 NOTE — Progress Notes (Signed)
Right shoulder x-ray shows some mild arthritis of the small joint at the top of the shoulder.  The main shoulder joint looks okay.  No broken bones are seen.

## 2024-01-18 ENCOUNTER — Ambulatory Visit (INDEPENDENT_AMBULATORY_CARE_PROVIDER_SITE_OTHER): Payer: Managed Care, Other (non HMO) | Admitting: Physical Therapy

## 2024-01-18 ENCOUNTER — Encounter: Payer: Self-pay | Admitting: Physical Therapy

## 2024-01-18 DIAGNOSIS — M25511 Pain in right shoulder: Secondary | ICD-10-CM | POA: Diagnosis not present

## 2024-01-18 DIAGNOSIS — G8929 Other chronic pain: Secondary | ICD-10-CM

## 2024-01-18 DIAGNOSIS — M25561 Pain in right knee: Secondary | ICD-10-CM | POA: Diagnosis not present

## 2024-01-18 NOTE — Therapy (Signed)
 OUTPATIENT PHYSICAL THERAPY LOWER EXTREMITY EVALUATION   Patient Name: Joseph Riddle MRN: [id number] DOB:10-19-1956, 68 y.o., male Today's Date: 01/18/2024  END OF SESSION:  PT End of Session - 01/18/24 1238     Visit Number 1    Number of Visits 16    Date for PT Re-Evaluation 03/14/24    Authorization Type [employer]    PT Start Time 1248    PT Stop Time 1326    PT Time Calculation (min) 38 min    Activity Tolerance Patient tolerated treatment well    Behavior During Therapy [facility] for tasks assessed/performed             Past Medical History:  Diagnosis Date   Arthritis    Cholelithiasis    Clotting disorder (HCC)    Coronary artery disease    Dyslipidemia 09/2015   Dyspnea    Hyperlipidemia    Hypertension    Ischemic cardiomyopathy 09/2015   EF 30-35 percent by echo 10/07/2015   STEMI (ST elevation myocardial infarction) (HCC) 10/05/2015   DES LAD, residual diagonal 90%, medical Rx   Tubular adenoma of colon 2008   Past Surgical History:  Procedure Laterality Date   CARDIAC CATHETERIZATION N/A 10/05/2015   Procedure: Left Heart Cath;  Surgeon: Peter M Swaziland, MD;  Location[facility] INVASIVE CV LAB;  Service: Cardiovascular;  Laterality: N/A;   CARDIAC CATHETERIZATION  10/05/2015   Procedure: Coronary Stent Intervention;  Surgeon: Peter M Swaziland, MD;  Location: [facility] INVASIVE CV LAB;  Service: Cardiovascular;;   COLONOSCOPY     CORONARY STENT PLACEMENT     FOOT SURGERY Left 10/2013   Dr. Alvan Dame   MENISCUS REPAIR     RIGHT/LEFT HEART CATH AND CORONARY ANGIOGRAPHY N/A 03/19/2022   Procedure: RIGHT/LEFT HEART CATH AND CORONARY ANGIOGRAPHY;  Surgeon: Laurey Morale, MD;  Location: [facility] INVASIVE CV LAB;  Service: Cardiovascular;  Laterality: N/A;   TOTAL KNEE ARTHROPLASTY Right 10/12/2016   Procedure: RIGHT TOTAL KNEE ARTHROPLASTY;  Surgeon: Dannielle Huh, MD;  Location: [facility] OR;  Service: Orthopedics;  Laterality: Right;   Patient Active Problem List   Diagnosis Date Noted    Chronic right shoulder pain 12/30/2023   Chronic systolic CHF (congestive heart failure) (HCC) 12/14/2016   Other fatigue 12/14/2016   S/P total knee replacement 10/12/2016   Preoperative cardiovascular examination 06/29/2016   Chronic pain of right knee 06/29/2016   Cardiomyopathy, ischemic 10/08/2015   Hypotension 10/08/2015   CAD S/P LAD DES 10/05/15    Ischemic chest pain (HCC)    Dyslipidemia    ST elevation (STEMI) myocardial infarction involving left anterior descending coronary artery (HCC) 10/05/2015   ST elevation myocardial infarction (STEMI) involving left anterior descending (LAD) coronary artery with complication (HCC) 10/05/2015   Neck pain on left side 06/19/2014   Chest pain 06/19/2014   Palpitations 06/19/2014   DDD (degenerative disc disease), cervical 07/13/2012   Degenerative joint disease involving multiple joints 03/10/2012   Insomnia 03/10/2012      PCP: Tracey Harries MD  REFERRING PROVIDER: Clementeen Graham,   REFERRING DIAG: R shoulder, R knee pain   THERAPY DIAG:  Acute pain of right shoulder  Chronic pain of right knee  Rationale for Evaluation and Treatment: Rehabilitation  ONSET DATE: shoulder: November ,   knee: TKA: 2017   SUBJECTIVE:   SUBJECTIVE STATEMENT: Pt referred for R shoulder and R knee pain. Pt states pain in R shoulder,  since about November when he was digging a hole  with ax. He did not have pain initially, but now has been hurting for a couple months. He is R handed. No previous pain. Did have injectio 2/6, Feels like it is starting to help. Notes pain with quick or  lateral motions,  opening a door, and behind the  back motions.  Pt works as IT trainer , Animator work.  No pain with work duties.  Some neck pain  No exercise at this time . R knee: standing 5 min, inc pain "all over" stiff.    PERTINENT HISTORY: MI, CHF, cardiac cath, stent, TKA 2017 PAIN:  Are you having pain? Yes: NPRS scale: 0-4 /10,  can be up to 8/10 with quick  motions.  Pain location: R shoulder  Pain description: pain, shooting at times  Aggravating factors: reaching out, behind, quick motions Relieving factors: rest     PRECAUTIONS: None  WEIGHT BEARING RESTRICTIONS: No  FALLS:  Has patient fallen in last 6 months? No  PLOF: Independent  PATIENT GOALS:  Decreased pain   NEXT MD VISIT:   OBJECTIVE:   DIAGNOSTIC FINDINGS:   PATIENT SURVEYS:    COGNITION: Overall cognitive status: Within functional limits for tasks assessed     SENSATION: WFL  EDEMA:    POSTURE:    No Significant postural limitations  PALPATION: minimal anterior pain with palpation  Shoulder ROM:   Active /Passive ROM Left eval Right eval  Hip flexion 145 A: 115  pain /  P: 135  Hip extension    Hip abduction  95 pain  Hip adduction    Hip internal rotation    Hip external rotation    Knee flexion    Knee extension    Ankle dorsiflexion    Ankle plantarflexion    Ankle inversion    Ankle eversion     (Blank rows = not tested)   MMT: Shoulder:   MMT Left eval Right  eval  Shoulder flexion  4  Shoulder extension    Shoulder abduction  4- pain  Shoulder adduction    Shoulder internal rotation  4+  Shoulder external rotation  4+  Knee flexion    Knee extension    Ankle dorsiflexion    Ankle plantarflexion    Ankle inversion    Ankle eversion     (Blank rows = not tested)  LOWER EXTREMITY SPECIAL TESTS:      TODAY'S TREATMENT:                                                                                                                              DATE:   01/18/2024  Ther ex: See below for HEP Education on arm positioning for work duties, Barrister's clerk and sleep   PATIENT EDUCATION:  Education details: PT POC, Exam findings, HEP Person educated: Patient Education method: Programmer, multimedia, Facilities manager, Actor cues, Verbal cues, and Handouts Education comprehension: verbalized understanding, returned demonstration, verbal cues  required, tactile cues required, and needs further  education   HOME EXERCISE PROGRAM: Access Code: [email or web address] URL: [email or web address] Date: 01/18/2024 Prepared by: Sedalia Muta  Exercises - Supine Shoulder Flexion Extension AAROM with Dowel  - 1 x daily - 1 sets - 10 reps - Sidelying Shoulder External Rotation Dumbbell  - 1 x daily - 1-2 sets - 10 reps - Standing Scapular Retraction  - 2 x daily - 1 sets - 5-10 reps  ASSESSMENT:  CLINICAL IMPRESSION: Patient presents with primary complaint of  pain in R shoulder. He has pain and limited motion for AROM and PROM. He has decreased strength and decreased ability for reaching, lifting, carrying and ADLs due to pain and dysfunction.   Pt will  benefit from skilled PT to improve deficits and pain and to return to PLOF. Pt wishes to focus on R shoulder pain at this time. He does have deficits in R knee with pain, decreased motion and functional limitations, but does not wish to address knee at this time in PT.  At end of session, pt stats he probably will not return for 1 month due to work schedule.   OBJECTIVE IMPAIRMENTS: decreased ROM, decreased strength, improper body mechanics, and pain.   ACTIVITY LIMITATIONS: carrying, lifting, dressing, reach over head, hygiene/grooming, and locomotion level  PARTICIPATION LIMITATIONS: meal prep, cleaning, shopping, community activity, and yard work  PERSONAL FACTORS: Time since onset of injury/illness/exacerbation are also affecting patient's functional outcome.   REHAB POTENTIAL: Good  CLINICAL DECISION MAKING: Stable/uncomplicated  EVALUATION COMPLEXITY: Low   GOALS: Goals reviewed with patient? Yes  SHORT TERM GOALS: Target date: 02/08/2024  Pt to be independent with initial HEP  Goal status: INITIAL    LONG TERM GOALS: Target date: 03/14/2024   Pt to be independent with final HEP  Goal status: INITIAL  2.  Pt to demo ability for shoulder ROM to be [facility] and pain  free, to improve ability for ADLs and work duties.   Goal status: INITIAL  3.  Pt to demo strength in R shoulder/UE to be [facility] for reaching, carrying, lifting, and IADLs.   Goal status: INITIAL    PLAN:  PT FREQUENCY: 1-2x/week  PT DURATION: 8 weeks  PLANNED INTERVENTIONS: Therapeutic exercises, Therapeutic activity, Neuromuscular re-education, Patient/Family education, Self Care, Joint mobilization, Joint manipulation, Stair training, Orthotic/Fit training, DME instructions, Aquatic Therapy, Dry Needling, Electrical stimulation, Cryotherapy, Moist heat, Taping, Ultrasound, Ionotophoresis 4mg /ml Dexamethasone, Manual therapy,  Vasopneumatic device, Traction, Spinal manipulation, Spinal mobilization,Balance training, Gait training,   PLAN FOR NEXT SESSION:    Sedalia Muta, PT, DPT 1:34 PM  01/18/24

## 2024-02-24 ENCOUNTER — Ambulatory Visit: Payer: Managed Care, Other (non HMO) | Admitting: Family Medicine

## 2024-04-18 NOTE — Progress Notes (Signed)
 [facility] Progress Note   PCP: Dr. Daivd Dub Cardiology: Dr. Maximo Spar HF Cardiology: Dr. Mitzie Anda  Reason for Visit: F/u for chronic systolic heart failure  68 y.o. with history of CAD s/p anterior MI in 11/16 and ischemic cardiomyopathy.  Patient was in good health until 11/16.  At that time, he had anterior STEMI treated with DES to proximal LAD.  Echo at the time showed EF 30-35%.  Since then, he has had 2 further echoes, both with EF 35-40%, most recently in 9/17.    He has severe OA R knee and is now s/p R TKR with Dr. Genevive Ket with no complication.   He noted atypical chest pain in 5/18.  Cardiolite showed infarction without significant ischemia.  Cardiac MRI in 5/18 showed EF 44% with peri-apical scar. Echo in 5/19 showed EF 35-40% with wall motion abnormalities. CPX in 1/19 showed very mild HF limitation.    Echo in 7/20 showed EF 40-45% with peri-apical akinesis.  Cardiolite in 2/21 showed EF 51%, no ischemia, prior anteroseptal MI.  Echo in 1/22 showed EF 45-50% with apical akinesis and normal RV.   Cardiolite in 4/23 showed EF 38%, old anteroseptal MI with no ischemia.    He continued with worsening exertional symptoms at follow up 4/23, and R/LHC arranged, showing no obstructive CAD, low filling pressures with preserved CO. No cardiac explanation for symptoms.  Echo in 5/23 showed EF 45-50%, normal RV.   Echo in 7/24 showed EF 45-50%, apical akinesis, normal RV.   Patient returns for followup of CHF/CAD.     His BP has been running lower recently.  Sometimes down in the 70s systolic at home.  Today, 88/50.  He denies lightheadedness, syncope, falls. He does feel tired at times. No dyspnea except with heavy exertion.  No chest pain. No orthopnea/PND. He has right shoulder pain likely from a rotator cuff injury.   ECG (personally reviewed): ***   Labs (2/17): LDL 46 Labs (11/17): K 3.9, creatinine 0.92, LFTs normal Labs (1/18): K 4.2, creatinine 0.92 Labs  (5/18): K 4.5, creatinine 0.94, LDL 38, HDL 43 Labs (8/18): K 4.1, creatinine 0.91 Labs (7/19): LDL 42, K 4.1, creatinine 1.61 Labs (7/20): K 4, creatinine 0.9, LDL 42, hgb 14.1  Labs (1/21): K 4.7, creatinine 0.87, hgb 15, LDL 48 Labs (9/21): K 4, creatinine 0.89 Labs (1/22): LDL 50, HDL 49 Labs (2/22): K 4.2, creatinine 1.05 Labs (10/22): K 3.8, creatinine 0.88 Labs (4/23): K 4.5, creatinine 0.76, hgb 15.3, HDL 43, LDL 50 Labs (1/24): LDL 56, K 3.7, creatinine 0.96 Labs (11/24): K 3.9, creatinine 0.97 Labs (1/25): K 4.3, creatinine 0.89, LDL 58   PMH: 1. Hyperlipidemia 2. OA: right knee. Right TKR 11/17.  3. CAD: Anterior STEMI 11/16.  Totally occluded LAD on cath => DES to proximal LAD.   - Cardiolite (5/18): EF 35%, large LAD territory infarction with no ischemia.  - Cardiolite (2/21): EF 515, no ischemia, prior anteroseptal MI.  - R/LHC (4/23): non-obstructive CAD, 40% stenoses ramus lesion, pLAD to mLAD lesion 15% stenosed, 1st diag lesion 50% stenosed, pRCA to mRCA lesion 15% stenosed. 4. Chronic systolic CHF: Ischemic cardiomyopathy. - 11/16 echo: EF 30-35%  - 2/17 echo: EF 35-40% with regional WMAs.  - 9/17 echo: EF 35-40%, anteroseptal/apical akinesis. - CPX (2/18): peak VO2 19.6, VE/VCO2 slope 30, RER 1.34.  Mild to moderate HF limitation.  - Cardiac MRI (5/18): EF 44%, mid anteroseptal, mid anterior, and peri-apical scar/akinesis, LGE pattern not suggestive  of viability.  Mild RV dilation with normal systolic function.  - Echo (5/19): EF 35-40%, septal apical and anterior hypokinesis - CPX (1/20): Very mild HF limitation.  VO2 max 20.6, VE/VCO2 slope 31, RER 1.35, PFTs normal.  - Echo (7/20): EF 40-45%, peri-apical akinesis, normal RV.  - Echo (1/22): EF 45-50% with apical akinesis and normal RV.  - Cardiolite (4/23): EF 38%, old anteroseptal MI with no ischemia. - R/LHC (4/23): no obstructive CAD, normal filling pressures and normal CO. - Echo (5/23): EF 45-50%, normal  RV.  - Echo (7/24): EF 45-50%, apical akinesis, normal RV.  5. COVID-19 10/21  SH: Lives in [place], works as Airline pilot, married, quit smoking > 15 years ago, occasional ETOH.   FH: No premature CAD  ROS: All systems reviewed and negative except as per HPI.   Current Outpatient Medications  Medication Sig Dispense Refill   acetaminophen  (TYLENOL ) 500 MG tablet Take 1,000 mg by mouth every 6 (six) hours as needed for mild pain.     aspirin  EC 81 MG tablet Take 81 mg by mouth daily. Swallow whole.     atorvastatin  (LIPITOR ) 40 MG tablet TAKE 1 TABLET (40 MG TOTAL) BY MOUTH DAILY AT 6 PM. PLEASE SCHEDULE APPOINTMENT FOR FUTURE REFILLS 90 tablet 0   carvedilol  (COREG ) 3.125 MG tablet TAKE 1 TABLET BY MOUTH TWICE A DAY 60 tablet 5   clopidogrel  (PLAVIX ) 75 MG tablet TAKE 1 TABLET BY MOUTH EVERY DAY 30 tablet 11   FARXIGA  10 MG TABS tablet TAKE 1 TABLET BY MOUTH EVERY DAY BEFORE BREAKFAST 90 tablet 3   gabapentin  (NEURONTIN ) 300 MG capsule Take 300 mg by mouth at bedtime. 1 - 2 tablets     lisinopril  (ZESTRIL ) 2.5 MG tablet Take 1 tablet (2.5 mg total) by mouth at bedtime. 90 tablet 3   meloxicam (MOBIC) 7.5 MG tablet Take 7.5 mg by mouth daily.     nitroGLYCERIN  (NITROSTAT ) 0.4 MG SL tablet PLACE 1 TABLET (0.4 MG TOTAL) UNDER THE TONGUE EVERY 5 (FIVE) MINUTES X 3 DOSES AS NEEDED FOR CHEST 25 tablet 6   oxymetazoline (AFRIN) 0.05 % nasal spray Place 2 sprays into both nostrils as needed for congestion.     pantoprazole  (PROTONIX ) 40 MG tablet Take 1 tablet (40 mg total) by mouth daily. 30 tablet 1   sodium chloride  (MURO 128) 5 % ophthalmic ointment Place 1 application into both eyes at bedtime.     spironolactone  (ALDACTONE ) 25 MG tablet Take 1 tablet (25 mg total) by mouth at bedtime.     zolpidem  (AMBIEN ) 10 MG tablet Take 10 mg by mouth at bedtime.     No current facility-administered medications for this visit.   There were no vitals taken for this visit.   Wt Readings from Last 3  Encounters:  12/30/23 63.5 kg (140 lb)  12/22/23 63.8 kg (140 lb 9.6 oz)  06/14/23 65 kg (143 lb 6.4 oz)   PHYSICAL EXAM: General:  Well appearing. No respiratory difficulty HEENT: normal Neck: supple. no JVD. Carotids 2+ bilat; no bruits. No lymphadenopathy or thyromegaly appreciated. Cor: PMI nondisplaced. Regular rate & rhythm. No rubs, gallops or murmurs. Lungs: clear Abdomen: soft, nontender, nondistended. No hepatosplenomegaly. No bruits or masses. Good bowel sounds. Extremities: no cyanosis, clubbing, rash, edema Neuro: alert & oriented x 3, cranial nerves grossly intact. moves all 4 extremities w/o difficulty. Affect pleasant.   Assessment/Plan: 1. CAD: s/p anterior MI 11/16 with DES to proximal LAD.  Cardiolite in 5/18, 2/21,  and again in 4/23 with old anterior MI, no ischemia. However, patient continued to have exertional lateral chest pain as well as dyspnea.  The EF by 4/23 Cardiolite was lower than in the past. Subsequent coronary angiography 4/23 showed non-obstructive disease. *** - Continue medical therapy, DAPT w/ Plavix  75 mg daily + ASA 81 mg daily.  Continue long term as long as no bleeding issues  - Continue atorvastatin  40 mg daily   2. Chronic systolic CHF:  Echo 9/17 with EF 35-40%.  He has been in this range since his MI.  Ischemic cardiomyopathy.  Prior CPX with mild to moderate limitation due to HF. Cardiac MRI with peri-apical scar and EF 44%. Echo in 5/19 with EF 35-40%.  CPX showed minimal HF limitation, normal PFTs.  Echo in 7/20 showed EF 40-45% and echo in 1/22 showed EF 45-50% with apical akinesis.  Cardiolite in 4/23 showed lower EF, 38%.  RHC 4/23 showed normal filling pressures with preserved CO.  Echo in 5/23 with EF 45-50%, normal RV.  Echo in 7/24 with EF 45-50%, apical akinesis, normal RV.  ***NYHA class ****  GDMT has been limited by low BP which seems worse recently though he is asymptomatic.  - Continue lisinopril   2.5 mg qhs - Continue spironolactone   25 mg qhs - Continue Coreg  3.125 mg bid.     - Continue Farxiga  10 mg daily.  - EF has been out of range for ICD.   3. Hyperlipidemia, LDL Goal < 70: at goal. Recent LP 1/25 showed LDL at 58 mg/dL  - continue atorvastatin  40 mg nightly  - repeat LP next visit    F/u ***  Ruddy Corral, PA-C   04/18/2024

## 2024-04-19 ENCOUNTER — Telehealth (HOSPITAL_COMMUNITY): Payer: Self-pay

## 2024-04-19 NOTE — Telephone Encounter (Signed)
 Called to confirm/remind patient of their appointment at the [facility] on 04/20/24.   Appointment:   [] Confirmed  [x] Left mess   [] No answer/No voice mail  [] VM Full/unable to leave message  [] Phone not in service  And to bring in all medications and/or complete list.

## 2024-04-20 ENCOUNTER — Ambulatory Visit (HOSPITAL_COMMUNITY): Payer: Self-pay | Admitting: Cardiology

## 2024-04-20 ENCOUNTER — Ambulatory Visit (HOSPITAL_COMMUNITY)
Admission: RE | Admit: 2024-04-20 | Discharge: 2024-04-20 | Disposition: A | Discharge status: Home / Self Care | Payer: Managed Care, Other (non HMO) | Source: Ambulatory Visit | Attending: Cardiology | Admitting: Cardiology

## 2024-04-20 VITALS — BP 90/66 | HR 60 | Wt 144.4 lb

## 2024-04-20 DIAGNOSIS — Z7984 Long term (current) use of oral hypoglycemic drugs: Secondary | ICD-10-CM | POA: Diagnosis not present

## 2024-04-20 DIAGNOSIS — I252 Old myocardial infarction: Secondary | ICD-10-CM | POA: Insufficient documentation

## 2024-04-20 DIAGNOSIS — E785 Hyperlipidemia, unspecified: Secondary | ICD-10-CM | POA: Insufficient documentation

## 2024-04-20 DIAGNOSIS — Z79899 Other long term (current) drug therapy: Secondary | ICD-10-CM | POA: Diagnosis not present

## 2024-04-20 DIAGNOSIS — Z7902 Long term (current) use of antithrombotics/antiplatelets: Secondary | ICD-10-CM | POA: Diagnosis not present

## 2024-04-20 DIAGNOSIS — Z7982 Long term (current) use of aspirin: Secondary | ICD-10-CM | POA: Insufficient documentation

## 2024-04-20 DIAGNOSIS — Z87891 Personal history of nicotine dependence: Secondary | ICD-10-CM | POA: Insufficient documentation

## 2024-04-20 DIAGNOSIS — I2510 Atherosclerotic heart disease of native coronary artery without angina pectoris: Secondary | ICD-10-CM | POA: Insufficient documentation

## 2024-04-20 DIAGNOSIS — R079 Chest pain, unspecified: Secondary | ICD-10-CM | POA: Diagnosis not present

## 2024-04-20 DIAGNOSIS — Z955 Presence of coronary angioplasty implant and graft: Secondary | ICD-10-CM | POA: Diagnosis present

## 2024-04-20 DIAGNOSIS — I959 Hypotension, unspecified: Secondary | ICD-10-CM | POA: Diagnosis not present

## 2024-04-20 DIAGNOSIS — I5022 Chronic systolic (congestive) heart failure: Secondary | ICD-10-CM | POA: Insufficient documentation

## 2024-04-20 DIAGNOSIS — R0600 Dyspnea, unspecified: Secondary | ICD-10-CM | POA: Diagnosis not present

## 2024-04-20 DIAGNOSIS — I255 Ischemic cardiomyopathy: Secondary | ICD-10-CM | POA: Diagnosis not present

## 2024-04-20 DIAGNOSIS — M1711 Unilateral primary osteoarthritis, right knee: Secondary | ICD-10-CM | POA: Insufficient documentation

## 2024-04-20 LAB — BASIC METABOLIC PANEL WITH GFR
Anion gap: 9 (ref 5–15)
BUN: 16 mg/dL (ref 8–23)
CO2: 21 mmol/L — ABNORMAL LOW (ref 22–32)
Calcium: 8.7 mg/dL — ABNORMAL LOW (ref 8.9–10.3)
Chloride: 108 mmol/L (ref 98–111)
Creatinine, Ser: 0.89 mg/dL (ref 0.61–1.24)
GFR, Estimated: >60 mL/min (ref >60)
Glucose, Bld: 92 mg/dL (ref 70–99)
Potassium: 3.5 mmol/L (ref 3.5–5.1)
Sodium: 138 mmol/L (ref 135–145)

## 2024-04-20 LAB — CBC
HCT: 38.8 % — ABNORMAL LOW (ref 39.0–52.0)
Hemoglobin: 12.6 g/dL — ABNORMAL LOW (ref 13.0–17.0)
MCH: 30 pg (ref 26.0–34.0)
MCHC: 32.5 g/dL (ref 30.0–36.0)
MCV: 92.4 fL (ref 80.0–100.0)
Platelets: 270 10*3/uL (ref 150–400)
RBC: 4.2 MIL/uL — ABNORMAL LOW (ref 4.22–5.81)
RDW: 12.3 % (ref 11.5–15.5)
WBC: 7.9 10*3/uL (ref 4.0–10.5)
nRBC: 0 % (ref 0.0–0.2)

## 2024-04-20 NOTE — Patient Instructions (Addendum)
 Good to see you today!   No medication changes  Your physician has requested that you have an echocardiogram. Echocardiography is a painless test that uses sound waves to create images of your heart. It provides your doctor with information about the size and shape of your heart and how well your heart's chambers and valves are working. This procedure takes approximately one hour. There are no restrictions for this procedure. Please do NOT wear cologne, perfume, aftershave, or lotions (deodorant is allowed). Please arrive 15 minutes prior to your appointment time.  Please note: We ask at that you not bring children with you during ultrasound (echo/ vascular) testing. Due to room size and safety concerns, children are not allowed in the ultrasound rooms during exams. Our front office staff cannot provide observation of children in our lobby area while testing is being conducted. An adult accompanying a patient to their appointment will only be allowed in the ultrasound room at the discretion of the ultrasound technician under special circumstances. We apologize for any inconvenience.  Labs done today, your results will be available in MyChart, we will contact you for abnormal readings.  Your physician recommends that you schedule a follow-up appointment :6 months(November) Call office  September to schedule an appointment  If you have any questions or concerns before your next appointment please send us  a message through [facility] or call our office at [phone].    TO LEAVE A MESSAGE FOR THE NURSE SELECT OPTION 2, PLEASE LEAVE A MESSAGE INCLUDING: YOUR NAME DATE OF BIRTH CALL BACK NUMBER REASON FOR CALL**this is important as we prioritize the call backs  YOU WILL RECEIVE A CALL BACK THE SAME DAY AS LONG AS YOU CALL BEFORE 4:00 PM At the [facility], you and your health needs are our priority. As part of our continuing mission to provide you with exceptional heart care, we  have created designated Provider Care Teams. These Care Teams include your primary Cardiologist (physician) and Advanced Practice Providers (APPs- Physician Assistants and Nurse Practitioners) who all work together to provide you with the care you need, when you need it.   You may see any of the following providers on your designated Care Team at your next follow up: Dr Jules Oar Dr Peder Bourdon Dr. Alwin Baars Dr. Arta Lark Amy Marijane Shoulders, NP Ruddy Corral, [place] [facility] [place], [place] Dennise Fitz, NP [place] Lee, NP Shawnee Dellen, NP Luster Salters, PharmD Bevely Brush, PharmD   Please be sure to bring in all your medications bottles to every appointment.    Thank you for choosing [facility]

## 2024-04-29 ENCOUNTER — Telehealth: Admitting: Physician Assistant

## 2024-04-29 DIAGNOSIS — R600 Localized edema: Secondary | ICD-10-CM | POA: Diagnosis not present

## 2024-04-29 NOTE — Patient Instructions (Signed)
 Joseph Riddle, thank you for joining [name], PA-C for today's virtual visit.  While this provider is not your primary care provider (PCP), if your PCP is located in our provider database this encounter information will be shared with them immediately following your visit.   A [facility] MyChart account gives you access to today's visit and all your visits, tests, and labs performed at [facility] " click here if you don't have a [facility] MyChart account or go to [email or web address]  Consent: (Patient) Joseph Riddle provided verbal consent for this virtual visit at the beginning of the encounter.  Current Medications:  Current Outpatient Medications:    acetaminophen  (TYLENOL ) 500 MG tablet, Take 1,000 mg by mouth every 6 (six) hours as needed for mild pain., Disp: , Rfl:    aspirin  EC 81 MG tablet, Take 81 mg by mouth daily. Swallow whole., Disp: , Rfl:    atorvastatin  (LIPITOR ) 40 MG tablet, TAKE 1 TABLET (40 MG TOTAL) BY MOUTH DAILY AT 6 PM. PLEASE SCHEDULE APPOINTMENT FOR FUTURE REFILLS, Disp: 90 tablet, Rfl: 0   carvedilol  (COREG ) 3.125 MG tablet, TAKE 1 TABLET BY MOUTH TWICE A DAY, Disp: 60 tablet, Rfl: 5   clopidogrel  (PLAVIX ) 75 MG tablet, TAKE 1 TABLET BY MOUTH EVERY DAY, Disp: 30 tablet, Rfl: 11   FARXIGA  10 MG TABS tablet, TAKE 1 TABLET BY MOUTH EVERY DAY BEFORE BREAKFAST, Disp: 90 tablet, Rfl: 3   gabapentin  (NEURONTIN ) 300 MG capsule, Take 300 mg by mouth at bedtime. 1 - 2 tablets, Disp: , Rfl:    lisinopril  (ZESTRIL ) 2.5 MG tablet, Take 1 tablet (2.5 mg total) by mouth at bedtime., Disp: 90 tablet, Rfl: 3   meloxicam (MOBIC) 7.5 MG tablet, Take 7.5 mg by mouth daily., Disp: , Rfl:    nitroGLYCERIN  (NITROSTAT ) 0.4 MG SL tablet, PLACE 1 TABLET (0.4 MG TOTAL) UNDER THE TONGUE EVERY 5 (FIVE) MINUTES X 3 DOSES AS NEEDED FOR CHEST, Disp: 25 tablet, Rfl: 6   oxymetazoline (AFRIN) 0.05 % nasal spray, Place 2 sprays into both nostrils as needed for  congestion., Disp: , Rfl:    pantoprazole  (PROTONIX ) 40 MG tablet, Take 1 tablet (40 mg total) by mouth daily., Disp: 30 tablet, Rfl: 1   sodium chloride  (MURO 128) 5 % ophthalmic ointment, Place 1 application into both eyes at bedtime., Disp: , Rfl:    spironolactone  (ALDACTONE ) 25 MG tablet, Take 1 tablet (25 mg total) by mouth at bedtime., Disp: , Rfl:    zolpidem  (AMBIEN ) 10 MG tablet, Take 10 mg by mouth at bedtime., Disp: , Rfl:    Medications ordered in this encounter:  No orders of the defined types were placed in this encounter.    *If you need refills on other medications prior to your next appointment, please contact your pharmacy*  Follow-Up: Call back or seek an in-person evaluation if the symptoms worsen or if the condition fails to improve as anticipated.  [facility] [phone]  Other Instructions Please follow dietary recommendations below.  Elevate leg while resting.  Start ace wrap to R foot/ankle over the weekend.  Continue Spironolactone .  Follow-up in person this week for further assessment and ongoing management. You need an in-person evaluation ASAP for any new or worsening symptoms over the weekend despite the recommendations given.  Low-Sodium Eating Plan Salt (sodium) helps you keep a healthy balance of fluids in your body. Too much sodium can raise your blood pressure. It can also cause  fluid and waste to be held in your body. Your health care provider or dietitian may recommend a low-sodium eating plan if you have high blood pressure (hypertension), kidney disease, liver disease, or heart failure. Eating less sodium can help lower your blood pressure and reduce swelling. It can also protect your heart, liver, and kidneys. What are tips for following this plan? Reading food labels  Check food labels for the amount of sodium per serving. If you eat more than one serving, you must multiply the listed amount by the number of servings. Choose  foods with less than 140 milligrams (mg) of sodium per serving. Avoid foods with 300 mg of sodium or more per serving. Always check how much sodium is in a product, even if the label says "unsalted" or "no salt added." Shopping  Buy products labeled as "low-sodium" or "no salt added." Buy fresh foods. Avoid canned foods and pre-made or frozen meals. Avoid canned, cured, or processed meats. Buy breads that have less than 80 mg of sodium per slice. Cooking  Eat more home-cooked food. Try to eat less restaurant, buffet, and fast food. Try not to add salt when you cook. Use salt-free seasonings or herbs instead of table salt or sea salt. Check with your provider or pharmacist before using salt substitutes. Cook with plant-based oils, such as canola, sunflower, or olive oil. Meal planning When eating at a restaurant, ask if your food can be made with less salt or no salt. Avoid dishes labeled as brined, pickled, cured, or smoked. Avoid dishes made with soy sauce, miso, or teriyaki sauce. Avoid foods that have monosodium glutamate (MSG) in them. MSG may be added to some restaurant food, sauces, soups, bouillon, and canned foods. Make meals that can be grilled, baked, poached, roasted, or steamed. These are often made with less sodium. General information Try to limit your sodium intake to 1,500-2,300 mg each day, or the amount told by your provider. What foods should I eat? Fruits Fresh, frozen, or canned fruit. Fruit juice. Vegetables Fresh or frozen vegetables. "No salt added" canned vegetables. "No salt added" tomato sauce and paste. Low-sodium or reduced-sodium tomato and vegetable juice. Grains Low-sodium cereals, such as oats, puffed wheat and rice, and shredded wheat. Low-sodium crackers. Unsalted rice. Unsalted pasta. Low-sodium bread. Whole grain breads and whole grain pasta. Meats and other proteins Fresh or frozen meat, poultry, seafood, and fish. These should have no added salt.  Low-sodium canned tuna and salmon. Unsalted nuts. Dried peas, beans, and lentils without added salt. Unsalted canned beans. Eggs. Unsalted nut butters. Dairy Milk. Soy milk. Cheese that is naturally low in sodium, such as ricotta cheese, fresh mozzarella, or Swiss cheese. Low-sodium or reduced-sodium cheese. Cream cheese. Yogurt. Seasonings and condiments Fresh and dried herbs and spices. Salt-free seasonings. Low-sodium mustard and ketchup. Sodium-free salad dressing. Sodium-free light mayonnaise. Fresh or refrigerated horseradish. Lemon juice. Vinegar. Other foods Homemade, reduced-sodium, or low-sodium soups. Unsalted popcorn and pretzels. Low-salt or salt-free chips. The items listed above may not be all the foods and drinks you can have. Talk to a dietitian to learn more. What foods should I avoid? Vegetables Sauerkraut, pickled vegetables, and relishes. Olives. [place] fries. Onion rings. Regular canned vegetables, except low-sodium or reduced-sodium items. Regular canned tomato sauce and paste. Regular tomato and vegetable juice. Frozen vegetables in sauces. Grains Instant hot cereals. Bread stuffing, pancake, and biscuit mixes. Croutons. Seasoned rice or pasta mixes. Noodle soup cups. Boxed or frozen macaroni and cheese. Regular salted crackers. Self-rising  flour. Meats and other proteins Meat or fish that is salted, canned, smoked, spiced, or pickled. Precooked or cured meat, such as sausages or meat loaves. Helene Loader. Ham. Pepperoni. Hot dogs. Corned beef. Chipped beef. Salt pork. Jerky. Pickled herring, anchovies, and sardines. Regular canned tuna. Salted nuts. Dairy Processed cheese and cheese spreads. Hard cheeses. Cheese curds. Blue cheese. Feta cheese. String cheese. Regular cottage cheese. Buttermilk. Canned milk. Fats and oils Salted butter. Regular margarine. Ghee. Bacon fat. Seasonings and condiments Onion salt, garlic salt, seasoned salt, table salt, and sea salt. Canned and  packaged gravies. Worcestershire sauce. Tartar sauce. Barbecue sauce. Teriyaki sauce. Soy sauce, including reduced-sodium soy sauce. Steak sauce. Fish sauce. Oyster sauce. Cocktail sauce. Horseradish that you find on the shelf. Regular ketchup and mustard. Meat flavorings and tenderizers. Bouillon cubes. Hot sauce. Pre-made or packaged marinades. Pre-made or packaged taco seasonings. Relishes. Regular salad dressings. Salsa. Other foods Salted popcorn and pretzels. Corn chips and puffs. Potato and tortilla chips. Canned or dried soups. Pizza. Frozen entrees and pot pies. The items listed above may not be all the foods and drinks you should avoid. Talk to a dietitian to learn more. This information is not intended to replace advice given to you by your health care provider. Make sure you discuss any questions you have with your health care provider. Document Revised: 11/26/2022 Document Reviewed: 11/26/2022 Elsevier Patient Education  2024 Elsevier Inc.  If you have been instructed to have an in-person evaluation today at a local Urgent Care facility, please use the link below. It will take you to a list of all of our available [facility], including address, phone number and hours of operation. Please do not delay care.  [facility]  If you or a family member do not have a primary care provider, use the link below to schedule a visit and establish care. When you choose a [facility] primary care physician or advanced practice provider, you gain a long-term partner in health. Find a Primary Care Provider  Learn more about [facility]'s in-office and virtual care options:  - Get Care Now

## 2024-04-29 NOTE — Progress Notes (Signed)
 Virtual Visit Consent   Joseph Riddle, you are scheduled for a virtual visit with a [facility] provider today. Just as with appointments in the office, your consent must be obtained to participate. Your consent will be active for this visit and any virtual visit you may have with one of our providers in the next 365 days. If you have a MyChart account, a copy of this consent can be sent to you electronically.  As this is a virtual visit, video technology does not allow for your provider to perform a traditional examination. This may limit your provider's ability to fully assess your condition. If your provider identifies any concerns that need to be evaluated in person or the need to arrange testing (such as labs, EKG, etc.), we will make arrangements to do so. Although advances in technology are sophisticated, we cannot ensure that it will always work on either your end or our end. If the connection with a video visit is poor, the visit may have to be switched to a telephone visit. With either a video or telephone visit, we are not always able to ensure that we have a secure connection.  By engaging in this virtual visit, you consent to the provision of healthcare and authorize for your insurance to be billed (if applicable) for the services provided during this visit. Depending on your insurance coverage, you may receive a charge related to this service.  I need to obtain your verbal consent now. Are you willing to proceed with your visit today? ANAV LAMMERT has provided verbal consent on 04/29/2024 for a virtual visit (video or telephone). Joseph Riddle, [place]  Date: 04/29/2024 6:19 PM   Virtual Visit via Video Note   I, Joseph Riddle, connected with  Joseph Riddle  ([id number], 1956-09-25) on 04/29/24 at  6:00 PM EDT by a video-enabled telemedicine application and verified that I am speaking with the correct person using two identifiers.  Location: Patient: Virtual Visit Location Patient:  Home Provider: Virtual Visit Location Provider: Home Office   I discussed the limitations of evaluation and management by telemedicine and the availability of in person appointments. The patient expressed understanding and agreed to proceed.    History of Present Illness: [name] is a 68 y.o. who identifies as a male who was assigned male at birth, and is being seen today for swelling of his R foot/ankle over the past 2-3 days. Notes is alleviated somewhat overnight with elevating his leg. Recurs after being on his foot/ankle for some time. Denies swelling proximal to the ankle. Denies trauma, injury or fall. Patient with history of OA of R knee s/p TKR. Notes some increased R knee pain over the past month with ambulation but denies knee swelling. Some occasional L foot swelling as well. Has history of CHF, taking medications as directed, but denies prior issue with substantial lower extremity swelling. Denies dyspnea or orthopnea. Does note spider veins/varicose veins of lower extremities.   HPI: HPI  Problems:  Patient Active Problem List   Diagnosis Date Noted   Chronic right shoulder pain 12/30/2023   Chronic systolic CHF (congestive heart failure) (HCC) 12/14/2016   Other fatigue 12/14/2016   S/P total knee replacement 10/12/2016   Preoperative cardiovascular examination 06/29/2016   Chronic pain of right knee 06/29/2016   Cardiomyopathy, ischemic 10/08/2015   Hypotension 10/08/2015   CAD S/P LAD DES 10/05/15    Ischemic chest pain (HCC)    Dyslipidemia    ST  elevation (STEMI) myocardial infarction involving left anterior descending coronary artery (HCC) 10/05/2015   ST elevation myocardial infarction (STEMI) involving left anterior descending (LAD) coronary artery with complication (HCC) 10/05/2015   Neck pain on left side 06/19/2014   Chest pain 06/19/2014   Palpitations 06/19/2014   DDD (degenerative disc disease), cervical 07/13/2012   Degenerative joint disease involving  multiple joints 03/10/2012   Insomnia 03/10/2012    Allergies: No Known Allergies Medications:  Current Outpatient Medications:    acetaminophen  (TYLENOL ) 500 MG tablet, Take 1,000 mg by mouth every 6 (six) hours as needed for mild pain., Disp: , Rfl:    aspirin  EC 81 MG tablet, Take 81 mg by mouth daily. Swallow whole., Disp: , Rfl:    atorvastatin  (LIPITOR ) 40 MG tablet, TAKE 1 TABLET (40 MG TOTAL) BY MOUTH DAILY AT 6 PM. PLEASE SCHEDULE APPOINTMENT FOR FUTURE REFILLS, Disp: 90 tablet, Rfl: 0   carvedilol  (COREG ) 3.125 MG tablet, TAKE 1 TABLET BY MOUTH TWICE A DAY, Disp: 60 tablet, Rfl: 5   clopidogrel  (PLAVIX ) 75 MG tablet, TAKE 1 TABLET BY MOUTH EVERY DAY, Disp: 30 tablet, Rfl: 11   FARXIGA  10 MG TABS tablet, TAKE 1 TABLET BY MOUTH EVERY DAY BEFORE BREAKFAST, Disp: 90 tablet, Rfl: 3   gabapentin  (NEURONTIN ) 300 MG capsule, Take 300 mg by mouth at bedtime. 1 - 2 tablets, Disp: , Rfl:    lisinopril  (ZESTRIL ) 2.5 MG tablet, Take 1 tablet (2.5 mg total) by mouth at bedtime., Disp: 90 tablet, Rfl: 3   meloxicam (MOBIC) 7.5 MG tablet, Take 7.5 mg by mouth daily., Disp: , Rfl:    nitroGLYCERIN  (NITROSTAT ) 0.4 MG SL tablet, PLACE 1 TABLET (0.4 MG TOTAL) UNDER THE TONGUE EVERY 5 (FIVE) MINUTES X 3 DOSES AS NEEDED FOR CHEST, Disp: 25 tablet, Rfl: 6   oxymetazoline (AFRIN) 0.05 % nasal spray, Place 2 sprays into both nostrils as needed for congestion., Disp: , Rfl:    pantoprazole  (PROTONIX ) 40 MG tablet, Take 1 tablet (40 mg total) by mouth daily., Disp: 30 tablet, Rfl: 1   sodium chloride  (MURO 128) 5 % ophthalmic ointment, Place 1 application into both eyes at bedtime., Disp: , Rfl:    spironolactone  (ALDACTONE ) 25 MG tablet, Take 1 tablet (25 mg total) by mouth at bedtime., Disp: , Rfl:    zolpidem  (AMBIEN ) 10 MG tablet, Take 10 mg by mouth at bedtime., Disp: , Rfl:   Observations/Objective: Patient is well-developed, well-nourished in no acute distress.  Resting comfortably  at home.  Head is  normocephalic, atraumatic.  No labored breathing.  Speech is clear and coherent with logical content.  Patient is alert and oriented at baseline.  Bilateral foot/ankle swelling with R >> L. Spider veins of ankle visible on video exam. Normal ROM present. Faint bruising noted behind the medial ankle but patient states this is not a new skin change and happens from time to time.   Assessment and Plan: 1. Pedal edema (Primary)  Patient c/o R pedal edema but edema on visual exam is bilateral but with R > L. Question if this is multifactorial -- patient with history of CHF and venous varicosities, noting increased salt intake recently on further questioning (takes grandsons to [employer] often recently), but also with OA so cannot rule out a component of that to this swelling. Sodium restricted diet reviewed. Elevate leg while resting. Start ace wrap to R foot/ankle over the weekend. Continue Spironolactone . Follow-up in person this week for further assessment and ongoing management. Strict  UC/ER precautions reviewed.  Follow Up Instructions: I discussed the assessment and treatment plan with the patient. The patient was provided an opportunity to ask questions and all were answered. The patient agreed with the plan and demonstrated an understanding of the instructions.  A copy of instructions were sent to the patient via MyChart unless otherwise noted below.   The patient was advised to call back or seek an in-person evaluation if the symptoms worsen or if the condition fails to improve as anticipated.    Joseph Maillard, PA-C

## 2024-05-03 ENCOUNTER — Other Ambulatory Visit (HOSPITAL_COMMUNITY): Payer: Self-pay | Admitting: Cardiology

## 2024-10-02 ENCOUNTER — Other Ambulatory Visit (HOSPITAL_COMMUNITY): Payer: Self-pay | Admitting: Cardiology

## 2024-10-17 ENCOUNTER — Other Ambulatory Visit (HOSPITAL_COMMUNITY)

## 2024-10-17 ENCOUNTER — Encounter (HOSPITAL_COMMUNITY): Admitting: Cardiology

## 2024-10-18 ENCOUNTER — Ambulatory Visit (HOSPITAL_COMMUNITY): Payer: Self-pay | Admitting: Cardiology

## 2024-10-18 ENCOUNTER — Encounter (HOSPITAL_COMMUNITY): Payer: Self-pay | Admitting: Cardiology

## 2024-10-18 ENCOUNTER — Ambulatory Visit (HOSPITAL_COMMUNITY)
Admission: RE | Admit: 2024-10-18 | Discharge: 2024-10-18 | Disposition: A | Discharge status: Home / Self Care | Source: Ambulatory Visit | Attending: Cardiology | Admitting: Cardiology

## 2024-10-18 ENCOUNTER — Ambulatory Visit (HOSPITAL_BASED_OUTPATIENT_CLINIC_OR_DEPARTMENT_OTHER)
Admission: RE | Admit: 2024-10-18 | Discharge: 2024-10-18 | Disposition: A | Discharge status: Home / Self Care | Source: Ambulatory Visit | Attending: Cardiology | Admitting: Cardiology

## 2024-10-18 VITALS — BP 90/58 | HR 56 | Ht 70.0 in | Wt 139.0 lb

## 2024-10-18 DIAGNOSIS — I2510 Atherosclerotic heart disease of native coronary artery without angina pectoris: Secondary | ICD-10-CM | POA: Diagnosis not present

## 2024-10-18 DIAGNOSIS — I340 Nonrheumatic mitral (valve) insufficiency: Secondary | ICD-10-CM | POA: Diagnosis not present

## 2024-10-18 DIAGNOSIS — Z955 Presence of coronary angioplasty implant and graft: Secondary | ICD-10-CM | POA: Diagnosis not present

## 2024-10-18 DIAGNOSIS — E785 Hyperlipidemia, unspecified: Secondary | ICD-10-CM | POA: Diagnosis not present

## 2024-10-18 DIAGNOSIS — Z7902 Long term (current) use of antithrombotics/antiplatelets: Secondary | ICD-10-CM | POA: Diagnosis not present

## 2024-10-18 DIAGNOSIS — R0600 Dyspnea, unspecified: Secondary | ICD-10-CM | POA: Diagnosis not present

## 2024-10-18 DIAGNOSIS — Z7984 Long term (current) use of oral hypoglycemic drugs: Secondary | ICD-10-CM | POA: Insufficient documentation

## 2024-10-18 DIAGNOSIS — I082 Rheumatic disorders of both aortic and tricuspid valves: Secondary | ICD-10-CM | POA: Diagnosis not present

## 2024-10-18 DIAGNOSIS — I5022 Chronic systolic (congestive) heart failure: Secondary | ICD-10-CM

## 2024-10-18 DIAGNOSIS — Z79899 Other long term (current) drug therapy: Secondary | ICD-10-CM | POA: Insufficient documentation

## 2024-10-18 DIAGNOSIS — I252 Old myocardial infarction: Secondary | ICD-10-CM | POA: Insufficient documentation

## 2024-10-18 DIAGNOSIS — I255 Ischemic cardiomyopathy: Secondary | ICD-10-CM | POA: Diagnosis not present

## 2024-10-18 DIAGNOSIS — Z96651 Presence of right artificial knee joint: Secondary | ICD-10-CM | POA: Insufficient documentation

## 2024-10-18 DIAGNOSIS — R079 Chest pain, unspecified: Secondary | ICD-10-CM | POA: Insufficient documentation

## 2024-10-18 LAB — LIPID PANEL
Cholesterol: 101 mg/dL (ref 0–200)
HDL: 47 mg/dL (ref >40)
LDL Cholesterol: 47 mg/dL (ref 0–99)
Total CHOL/HDL Ratio: 2.1 ratio
Triglycerides: 34 mg/dL (ref <150)
VLDL: 7 mg/dL (ref 0–40)

## 2024-10-18 LAB — BRAIN NATRIURETIC PEPTIDE: B Natriuretic Peptide: 44.4 pg/mL (ref 0.0–100.0)

## 2024-10-18 LAB — ECHOCARDIOGRAM COMPLETE
Area-P 1/2: 2.39 cm2
Calc EF: 48.2 %
S' Lateral: 3.2 cm
Single Plane A2C EF: 52.3 %
Single Plane A4C EF: 44.4 %

## 2024-10-18 LAB — BASIC METABOLIC PANEL WITH GFR
Anion gap: 10 (ref 5–15)
BUN: 16 mg/dL (ref 8–23)
CO2: 26 mmol/L (ref 22–32)
Calcium: 8.8 mg/dL — ABNORMAL LOW (ref 8.9–10.3)
Chloride: 104 mmol/L (ref 98–111)
Creatinine, Ser: 1.02 mg/dL (ref 0.61–1.24)
GFR, Estimated: >60 mL/min (ref >60)
Glucose, Bld: 76 mg/dL (ref 70–99)
Potassium: 3.7 mmol/L (ref 3.5–5.1)
Sodium: 140 mmol/L (ref 135–145)

## 2024-10-18 LAB — CBC
HCT: 39.2 % (ref 39.0–52.0)
Hemoglobin: 13.4 g/dL (ref 13.0–17.0)
MCH: 31.2 pg (ref 26.0–34.0)
MCHC: 34.2 g/dL (ref 30.0–36.0)
MCV: 91.4 fL (ref 80.0–100.0)
Platelets: 262 K/uL (ref 150–400)
RBC: 4.29 MIL/uL (ref 4.22–5.81)
RDW: 12.6 % (ref 11.5–15.5)
WBC: 7.6 K/uL (ref 4.0–10.5)
nRBC: 0 % (ref 0.0–0.2)

## 2024-10-18 MED ORDER — NITROGLYCERIN 1 MG/10 ML FOR IR/CATH LAB
INTRA_ARTERIAL | Status: AC
  Filled 2024-10-18: qty 10

## 2024-10-18 NOTE — Progress Notes (Signed)
  Echocardiogram 2D Echocardiogram has been performed.  Joseph Riddle 10/18/2024, 9:44 AM

## 2024-10-18 NOTE — Patient Instructions (Signed)
 Medication Changes:  STOP TAKING ASPIRIN    Lab Work:  Labs done today, your results will be available in MyChart, we will contact you for abnormal readings.  Follow-Up in: 6 MONTHS WITH APP AS SCHEDULED   At the [facility], you and your health needs are our priority. We have a designated team specialized in the treatment of Heart Failure. This Care Team includes your primary Heart Failure Specialized Cardiologist (physician), Advanced Practice Providers (APPs- Physician Assistants and Nurse Practitioners), and Pharmacist who all work together to provide you with the care you need, when you need it.   You may see any of the following providers on your designated Care Team at your next follow up:  Dr. Toribio Fuel Dr. Ezra Shuck Dr. Odis Brownie Greig Mosses, NP Caffie Shed, [place] [facility] [place], [place] Beckey Coe, NP Jordan Lee, NP Tinnie Redman, PharmD   Please be sure to bring in all your medications bottles to every appointment.   Need to Contact Us :  If you have any questions or concerns before your next appointment please send us  a message through [facility] or call our office at [phone].    TO LEAVE A MESSAGE FOR THE NURSE SELECT OPTION 2, PLEASE LEAVE A MESSAGE INCLUDING: YOUR NAME DATE OF BIRTH CALL BACK NUMBER REASON FOR CALL**this is important as we prioritize the call backs  YOU WILL RECEIVE A CALL BACK THE SAME DAY AS LONG AS YOU CALL BEFORE 4:00 PM

## 2024-10-21 NOTE — Progress Notes (Signed)
 [facility] Progress Note   PCP: Dr. Pura Cardiology: Dr. Mona HF Cardiology: Dr. Rolan  Reason for Visit: F/u for chronic systolic heart failure and CAD   68 y.o. with history of CAD s/p anterior MI in 11/16 and ischemic cardiomyopathy.  Patient was in good health until 11/16.  At that time, he had anterior STEMI treated with DES to proximal LAD.  Echo at the time showed EF 30-35%. EF has chronically been moderately reduced since then.    He has severe OA R knee and is now s/p R TKR with Dr. Rubie with no complication.   He noted atypical chest pain in 5/18.  Cardiolite showed infarction without significant ischemia.  Cardiac MRI in 5/18 showed EF 44% with peri-apical scar. Echo in 5/19 showed EF 35-40% with wall motion abnormalities. CPX in 1/19 showed very mild HF limitation.    Echo in 7/20 showed EF 40-45% with peri-apical akinesis.  Cardiolite in 2/21 showed EF 51%, no ischemia, prior anteroseptal MI.  Echo in 1/22 showed EF 45-50% with apical akinesis and normal RV.   Cardiolite in 4/23 showed EF 38%, old anteroseptal MI with no ischemia.    He continued with worsening exertional symptoms at follow up 4/23, and R/LHC arranged, showing no obstructive CAD, low filling pressures with preserved CO. No cardiac explanation for symptoms.  Echo in 5/23 showed EF 45-50%, normal RV.   Echo in 7/24 showed EF 45-50%, apical akinesis, normal RV.  Echo was done today and reviewed, EF 45-50%, apical septal and apical anterior akinesis, RV normal size/systolic function, normal IVC.   Patient returns for followup of CHF/CAD.  No chest pain.  He fatigues with yardwork but no change from the past.  No dyspnea walking up 2 flights of stairs.  No lightheadedness though BP runs consistently in the 90s systolic.  Weight down 5 lbs.   ECG: NSR, LAFB, old anterior MI  Labs (1/24): LDL 56, K 3.7, creatinine 9.03 Labs (11/24): K 3.9, creatinine 0.97 Labs (1/25): K 4.3, creatinine  0.89, LDL 58  Labs (5/25): K 3.5, creatinine 0.89 Labs (10/25): hgb 13.2  PMH: 1. Hyperlipidemia 2. OA: right knee. Right TKR 11/17.  3. CAD: Anterior STEMI 11/16.  Totally occluded LAD on cath => DES to proximal LAD.   - Cardiolite (5/18): EF 35%, large LAD territory infarction with no ischemia.  - Cardiolite (2/21): EF 515, no ischemia, prior anteroseptal MI.  - R/LHC (4/23): non-obstructive CAD, 40% stenoses ramus lesion, pLAD to mLAD lesion 15% stenosed, 1st diag lesion 50% stenosed, pRCA to mRCA lesion 15% stenosed. 4. Chronic systolic CHF: Ischemic cardiomyopathy. - 11/16 echo: EF 30-35%  - 2/17 echo: EF 35-40% with regional WMAs.  - 9/17 echo: EF 35-40%, anteroseptal/apical akinesis. - CPX (2/18): peak VO2 19.6, VE/VCO2 slope 30, RER 1.34.  Mild to moderate HF limitation.  - Cardiac MRI (5/18): EF 44%, mid anteroseptal, mid anterior, and peri-apical scar/akinesis, LGE pattern not suggestive of viability.  Mild RV dilation with normal systolic function.  - Echo (5/19): EF 35-40%, septal apical and anterior hypokinesis - CPX (1/20): Very mild HF limitation.  VO2 max 20.6, VE/VCO2 slope 31, RER 1.35, PFTs normal.  - Echo (7/20): EF 40-45%, peri-apical akinesis, normal RV.  - Echo (1/22): EF 45-50% with apical akinesis and normal RV.  - Cardiolite (4/23): EF 38%, old anteroseptal MI with no ischemia. - R/LHC (4/23): no obstructive CAD, normal filling pressures and normal CO. - Echo (5/23): EF 45-50%, normal  RV.  - Echo (7/24): EF 45-50%, apical akinesis, normal RV.  - Echo (11/25): EF 45-50%, apical septal and apical anterior akinesis, RV normal size/systolic function, normal IVC.  5. COVID-19 10/21  SH: Lives in [place], works as airline pilot, married, quit smoking > 15 years ago, occasional ETOH.   FH: No premature CAD  ROS: All systems reviewed and negative except as per HPI.   Current Outpatient Medications  Medication Sig Dispense Refill   acetaminophen  (TYLENOL ) 500 MG  tablet Take 1,000 mg by mouth every 6 (six) hours as needed for mild pain.     atorvastatin  (LIPITOR ) 40 MG tablet TAKE 1 TABLET (40 MG TOTAL) BY MOUTH DAILY AT 6 PM. PLEASE SCHEDULE APPOINTMENT FOR FUTURE REFILLS 90 tablet 0   carvedilol  (COREG ) 3.125 MG tablet TAKE 1 TABLET BY MOUTH TWICE A DAY 60 tablet 5   clopidogrel  (PLAVIX ) 75 MG tablet TAKE 1 TABLET BY MOUTH EVERY DAY 30 tablet 11   FARXIGA  10 MG TABS tablet TAKE 1 TABLET BY MOUTH EVERY DAY BEFORE BREAKFAST 90 tablet 3   gabapentin  (NEURONTIN ) 300 MG capsule Take 300 mg by mouth at bedtime. 1 - 2 tablets     lisinopril  (ZESTRIL ) 2.5 MG tablet Take 1 tablet (2.5 mg total) by mouth at bedtime. 90 tablet 3   meloxicam (MOBIC) 7.5 MG tablet Take 7.5 mg by mouth daily.     nitroGLYCERIN  (NITROSTAT ) 0.4 MG SL tablet PLACE 1 TABLET (0.4 MG TOTAL) UNDER THE TONGUE EVERY 5 (FIVE) MINUTES X 3 DOSES AS NEEDED FOR CHEST 25 tablet 6   oxymetazoline (AFRIN) 0.05 % nasal spray Place 2 sprays into both nostrils as needed for congestion.     pantoprazole  (PROTONIX ) 40 MG tablet Take 1 tablet (40 mg total) by mouth daily. 30 tablet 1   sodium chloride  (MURO 128) 5 % ophthalmic ointment Place 1 application into both eyes at bedtime.     spironolactone  (ALDACTONE ) 25 MG tablet TAKE 1 TABLET BY MOUTH EVERY DAY 90 tablet 1   zolpidem  (AMBIEN ) 10 MG tablet Take 10 mg by mouth at bedtime.     No current facility-administered medications for this encounter.   BP (!) 90/58   Pulse (!) 56   Ht 5' 10 (1.778 m)   Wt 63 kg (139 lb)   SpO2 99%   BMI 19.94 kg/m    Wt Readings from Last 3 Encounters:  10/18/24 63 kg (139 lb)  04/20/24 65.5 kg (144 lb 6.4 oz)  12/30/23 63.5 kg (140 lb)   PHYSICAL EXAM: General: NAD Neck: No JVD, no thyromegaly or thyroid nodule.  Lungs: Clear to auscultation bilaterally with normal respiratory effort. CV: Nondisplaced PMI.  Heart regular S1/S2, no S3/S4, no murmur.  No peripheral edema.  No carotid bruit.  Normal pedal  pulses.  Abdomen: Soft, nontender, no hepatosplenomegaly, no distention.  Skin: Intact without lesions or rashes.  Neurologic: Alert and oriented x 3.  Psych: Normal affect. Extremities: No clubbing or cyanosis.  HEENT: Normal.   Assessment/Plan: 1. CAD: s/p anterior MI 11/16 with DES to proximal LAD.  Cardiolite in 5/18, 2/21, and again in 4/23 with old anterior MI, no ischemia. However, patient continued to have exertional lateral chest pain as well as dyspnea.  The EF by 4/23 Cardiolite was lower than in the past. Subsequent coronary angiography 4/23 showed non-obstructive disease. He denies ischemic chest pain  - I think it would be ok for him to stop ASA today and continue long-term on clopidogrel  75  mg daily.  - Continue atorvastatin  40 mg daily.  Lipids today.  2. Chronic systolic CHF:  Echo 9/17 with EF 35-40%.  EF has been moderately reduced since his MI.  Ischemic cardiomyopathy.  Prior CPX with mild to moderate limitation due to HF. Cardiac MRI with peri-apical scar and EF 44%. Echo in 5/19 with EF 35-40%.  CPX showed minimal HF limitation, normal PFTs.  Echo in 7/20 showed EF 40-45% and echo in 1/22 showed EF 45-50% with apical akinesis.  Cardiolite in 4/23 showed lower EF, 38%.  RHC 4/23 showed normal filling pressures with preserved CO.  Echo in 5/23 with EF 45-50%, normal RV.  Echo in 7/24 and again today showed EF 45-50%, apical akinesis, normal RV.  NYHA class I-II, not volume overloaded on exam. GDMT has been limited by low BP, though no orthostatic symptoms.  - Continue lisinopril   2.5 mg qhs - Continue spironolactone  25 mg at bedtime. BMET/BNP today.  - Continue Coreg  3.125 mg bid.     - Continue Farxiga  10 mg daily.  - He does not need a loop diuretic  - EF has been out of range for ICD.   3. Hyperlipidemia, LDL Goal < 55.  - continue atorvastatin  40 mg nightly, check lipids today.   Followup in 6 months with APP.   I spent 32 minutes reviewing records,  interviewing/examining patient, and managing orders.   Ezra Shuck,  10/21/2024

## 2024-12-27 ENCOUNTER — Other Ambulatory Visit (HOSPITAL_COMMUNITY): Payer: Self-pay | Admitting: Cardiology

## 2025-04-17 ENCOUNTER — Ambulatory Visit (HOSPITAL_COMMUNITY)
